# Patient Record
Sex: Male | Born: 1940
Health system: Southern US, Community
[De-identification: ages and names within clinical notes are randomized; demographics above are authoritative.]

## PROBLEM LIST (undated history)

## (undated) ENCOUNTER — Ambulatory Visit

## (undated) DIAGNOSIS — E785 Hyperlipidemia, unspecified: Secondary | ICD-10-CM

## (undated) DIAGNOSIS — G4730 Sleep apnea, unspecified: Secondary | ICD-10-CM

## (undated) DIAGNOSIS — A6920 Lyme disease, unspecified: Secondary | ICD-10-CM

## (undated) DIAGNOSIS — K5790 Diverticulosis of intestine, part unspecified, without perforation or abscess without bleeding: Secondary | ICD-10-CM

## (undated) DIAGNOSIS — IMO0002 Reserved for concepts with insufficient information to code with codable children: Secondary | ICD-10-CM

## (undated) DIAGNOSIS — L570 Actinic keratosis: Secondary | ICD-10-CM

## (undated) DIAGNOSIS — R7301 Impaired fasting glucose: Secondary | ICD-10-CM

## (undated) DIAGNOSIS — N529 Male erectile dysfunction, unspecified: Secondary | ICD-10-CM

## (undated) DIAGNOSIS — G43809 Other migraine, not intractable, without status migrainosus: Secondary | ICD-10-CM

## (undated) DIAGNOSIS — M1990 Unspecified osteoarthritis, unspecified site: Secondary | ICD-10-CM

## (undated) DIAGNOSIS — C61 Malignant neoplasm of prostate: Secondary | ICD-10-CM

## (undated) DIAGNOSIS — M722 Plantar fascial fibromatosis: Secondary | ICD-10-CM

## (undated) DIAGNOSIS — D229 Melanocytic nevi, unspecified: Secondary | ICD-10-CM

## (undated) HISTORY — DX: Actinic keratosis: L57.0

## (undated) HISTORY — PX: TONSILLECTOMY: SHX5217

## (undated) HISTORY — DX: Male erectile dysfunction, unspecified: N52.9

## (undated) HISTORY — DX: Reserved for concepts with insufficient information to code with codable children: IMO0002

## (undated) HISTORY — PX: TONSILLECTOMY: SUR1361

## (undated) HISTORY — DX: Plantar fascial fibromatosis: M72.2

## (undated) HISTORY — DX: Hyperlipidemia, unspecified: E78.5

## (undated) HISTORY — PX: APPENDECTOMY: SHX54

## (undated) HISTORY — PX: COLONOSCOPY: SHX174

## (undated) HISTORY — DX: Sleep apnea, unspecified: G47.30

## (undated) HISTORY — DX: Impaired fasting glucose: R73.01

## (undated) HISTORY — DX: Diverticulosis of intestine, part unspecified, without perforation or abscess without bleeding: K57.90

## (undated) HISTORY — DX: Malignant neoplasm of prostate: C61

## (undated) HISTORY — DX: Melanocytic nevi, unspecified: D22.9

---
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
## 1964-10-11 DIAGNOSIS — J189 Pneumonia, unspecified organism: Secondary | ICD-10-CM

## 1964-10-11 HISTORY — DX: Pneumonia, unspecified organism: J18.9

## 1984-10-11 HISTORY — PX: PROSTATECTOMY: SHX69

## 1998-10-11 ENCOUNTER — Emergency Department (HOSPITAL_COMMUNITY): Admission: EM | Admit: 1998-10-11 | Discharge: 1998-10-11 | Payer: Self-pay | Admitting: Emergency Medicine

## 2002-08-25 ENCOUNTER — Ambulatory Visit (HOSPITAL_BASED_OUTPATIENT_CLINIC_OR_DEPARTMENT_OTHER): Admission: RE | Admit: 2002-08-25 | Discharge: 2002-08-25 | Payer: Self-pay | Admitting: Family Medicine

## 2004-10-11 DIAGNOSIS — D126 Benign neoplasm of colon, unspecified: Secondary | ICD-10-CM

## 2004-10-11 HISTORY — DX: Benign neoplasm of colon, unspecified: D12.6

## 2005-09-09 ENCOUNTER — Ambulatory Visit: Payer: Self-pay | Admitting: Gastroenterology

## 2005-09-14 ENCOUNTER — Ambulatory Visit: Payer: Self-pay | Admitting: Gastroenterology

## 2005-09-14 ENCOUNTER — Encounter (INDEPENDENT_AMBULATORY_CARE_PROVIDER_SITE_OTHER): Payer: Self-pay | Admitting: Specialist

## 2009-08-07 ENCOUNTER — Encounter: Payer: Self-pay | Admitting: Gastroenterology

## 2009-08-18 ENCOUNTER — Encounter (INDEPENDENT_AMBULATORY_CARE_PROVIDER_SITE_OTHER): Payer: Self-pay | Admitting: *Deleted

## 2010-03-26 ENCOUNTER — Telehealth: Payer: Self-pay | Admitting: Gastroenterology

## 2010-07-27 ENCOUNTER — Encounter (INDEPENDENT_AMBULATORY_CARE_PROVIDER_SITE_OTHER): Payer: Self-pay | Admitting: *Deleted

## 2010-09-10 ENCOUNTER — Encounter (INDEPENDENT_AMBULATORY_CARE_PROVIDER_SITE_OTHER): Payer: Self-pay | Admitting: *Deleted

## 2010-09-11 ENCOUNTER — Ambulatory Visit: Payer: Self-pay | Admitting: Gastroenterology

## 2010-09-16 ENCOUNTER — Telehealth: Payer: Self-pay | Admitting: Gastroenterology

## 2010-09-21 ENCOUNTER — Ambulatory Visit: Payer: Self-pay | Admitting: Gastroenterology

## 2010-10-26 ENCOUNTER — Encounter: Payer: Self-pay | Admitting: Cardiology

## 2010-10-26 ENCOUNTER — Encounter
Admission: RE | Admit: 2010-10-26 | Discharge: 2010-10-26 | Payer: Self-pay | Source: Home / Self Care | Attending: Family Medicine | Admitting: Family Medicine

## 2010-11-12 NOTE — Progress Notes (Signed)
Summary: Moviprep prep high copay  Phone Note Call from Patient Call back at Home Phone [phone]   Call For: Dr Russella Dar Reason for Call: Talk to Nurse Summary of Call: Wonders if its normal for Moviprep to cost $60 &  for insurance to normally not cover it? Initial call taken by: Leanor Kail [facility],  September 17, 2010 8:07 AM  Follow-up for Phone Call        Told pt that most insurance companies do not pay for Movi prep and it usually costs around 50 to 60 dollars depending on the pharmacy. Pt states he was just wondering and states he doesn't mind paying for that prep as long as Dr. Russella Dar wants him too. I told pt that Dr. Russella Dar does prefer this prep but I can send him a mail in rebate for the Movi prep if he wants me to. The patient said yes and rebate form mailed.  Follow-up by: Christie Nottingham CMA Duncan Dull),  September 17, 2010 11:26 AM

## 2010-11-12 NOTE — Letter (Signed)
Summary: Pre Visit Letter Revised  [facility]  [address] [place], [place] [address]   Phone: [phone]  Fax: [phone]        07/27/2010 MRN: [id number] [facility] [address] [place], [place]  [address]             Procedure Date:  09/21/2010   Welcome to the Gastroenterology Division at [facility].    You are scheduled to see a nurse for your pre-procedure visit on 09/10/2010 at 8:00AM on the 3rd floor at [facility], [address]  We ask that you try to arrive at our office 15 minutes prior to your appointment time to allow for check-in.  Please take a minute to review the attached form.  If you answer "Yes" to one or more of the questions on the first page, we ask that you call the person listed at your earliest opportunity.  If you answer "No" to all of the questions, please complete the rest of the form and bring it to your appointment.    Your nurse visit will consist of discussing your medical and surgical history, your immediate family medical history, and your medications.   If you are unable to list all of your medications on the form, please bring the medication bottles to your appointment and we will list them.  We will need to be aware of both prescribed and over the counter drugs.  We will need to know exact dosage information as well.    Please be prepared to read and sign documents such as consent forms, a financial agreement, and acknowledgement forms.  If necessary, and with your consent, a friend or relative is welcome to sit-in on the nurse visit with you.  Please bring your insurance card so that we may make a copy of it.  If your insurance requires a referral to see a specialist, please bring your referral form from your primary care physician.  No co-pay is required for this nurse visit.     If you cannot keep your appointment, please call [phone] to cancel or reschedule prior to your appointment date.  This  allows [place] the opportunity to schedule an appointment for another patient in need of care.    Thank you for choosing [facility] for your medical needs.  We appreciate the opportunity to care for you.  Please visit [place] at our website  to learn more about our practice.  Sincerely, The Gastroenterology Division

## 2010-11-12 NOTE — Procedures (Signed)
Summary: Recall / [facility]  Recall / [facility]   Imported By: Lennie Odor 03/12/2010 17:00:57  _____________________________________________________________________  External Attachment:    Type:   Image     Comment:   External Document

## 2010-11-12 NOTE — Miscellaneous (Signed)
Summary: [facility] previsit  Clinical Lists Changes  Medications: Added new medication of MOVIPREP 100 GM  SOLR (PEG-KCL-NACL-NASULF-NA ASC-C) As per prep instructions. - Signed Rx of MOVIPREP 100 GM  SOLR (PEG-KCL-NACL-NASULF-NA ASC-C) As per prep instructions.;  #1 x 0;  Signed;  Entered by: Karl Bales RN;  Authorized by: Meryl Dare MD [facility];  Method used: [id number] [address] [place], [place]  [address], Ph: [phone] or [phone], Fax: [phone] Observations: Added new observation of NKA: T (09/11/2010 16:10)    Prescriptions: MOVIPREP 100 GM  SOLR (PEG-KCL-NACL-NASULF-NA ASC-C) As per prep instructions.  #1 x 0   Entered by:   Karl Bales RN   Authorized by:   Meryl Dare MD [facility]   Signed by:   Karl Bales RN on 09/11/2010   Method used:   [facility]ronically to        [phone][facility] (retail)       [address]       [place], [place]  [address]       Ph: [phone] or [phone]       Fax: [phone]   RxID:   [id number]

## 2010-11-12 NOTE — Procedures (Signed)
Summary: Colonoscopy  Patient: Andrew Hayes Note: All result statuses are Final unless otherwise noted.  Tests: (1) Colonoscopy (COL)   COL Colonoscopy           DONE     [facility]     [address]     [place], [place]  [address]           COLONOSCOPY PROCEDURE REPORT     PATIENT:  Andrew Hayes, Andrew Hayes  MR#:  [id number]     BIRTHDATE:  Nov 09, 1940, 69 yrs. old  GENDER:  male     ENDOSCOPIST:  Judie Petit T. Russella Dar, MD, [facility]           PROCEDURE DATE:  09/21/2010     PROCEDURE:  Colonoscopy 19[address]     ASA CLASS:  Class II     INDICATIONS:  1) surveillance and high-risk screening  2) history     of adenomatous colon polyps: 09/2005.     MEDICATIONS:   Fentanyl 75 mcg IV, Versed 7 mg IV     DESCRIPTION OF PROCEDURE:   After the risks benefits and     alternatives of the procedure were thoroughly explained, informed     consent was obtained.  Digital rectal exam was performed and     revealed no abnormalities.   The LB [id number]-[id number] endoscope     was introduced through the anus and advanced to the cecum, which     was identified by both the appendix and ileocecal valve, without     limitations.  The quality of the prep was excellent, using     MoviPrep.  The instrument was then slowly withdrawn as the colon     was fully examined.     <<PROCEDUREIMAGES>>     FINDINGS:  Mild diverticulosis was found in the sigmoid colon.  A     normal appearing cecum, ileocecal valve, and appendiceal orifice     were identified. The ascending, hepatic flexure, transverse,     splenic flexure, descending colon, and rectum appeared     unremarkable.   Retroflexed views in the rectum revealed no     abnormalities.  The time to cecum =  3.5  minutes. The scope was     then withdrawn (time =  10.5  min) from the patient and the     procedure completed.           COMPLICATIONS:  None           ENDOSCOPIC IMPRESSION:     1) Mild diverticulosis in the sigmoid colon           RECOMMENDATIONS:  1) High fiber diet with liberal fluid intake.     2) Repeat Colonoscopy in 5 years.           Venita Lick. Russella Dar, MD, [facility]           CC: Catha Gosselin, MD           n.     Rosalie DoctorVenita Lick. Stark at 09/21/2010 09:23 AM           Karle Barr, [id number]  Note: An exclamation mark (!) indicates a result that was not dispersed into the flowsheet. Document Creation Date: 09/21/2010 9:23 AM _______________________________________________________________________  (1) Order result status: Final Collection or observation date-time: 09/21/2010 09:18 Requested date-time:  Receipt date-time:  Reported date-time:  Referring Physician:   Ordering Physician: Claudette Head [phone]) Specimen Source:  Source: Launa Grill Order Number:  [id number] Lab site:   Appended Document: Colonoscopy    Clinical Lists Changes  Observations: Added new observation of COLONNXTDUE: 09/2015 (09/21/2010 11:33)

## 2010-11-12 NOTE — Letter (Signed)
Summary: [facility] Instructions  [facility]  [address] [place], [place] [address]   Phone: [phone]  Fax: [phone]       Andrew Hayes    09/20/1941    MRN: [id number]        Procedure Day /Date:  Monday 09/21/2010     Arrival Time:  8:30 am      Procedure Time:  9:30 am     Location of Procedure:                    _ x_  [facility] (4th Floor)                        PREPARATION FOR COLONOSCOPY WITH MOVIPREP   Starting 5 days prior to your procedure Wednesday 12/7 do not eat nuts, seeds, popcorn, corn, beans, peas,  salads, or any raw vegetables.  Do not take any fiber supplements (e.g. Metamucil, Citrucel, and Benefiber).  THE DAY BEFORE YOUR PROCEDURE         DATE: Sunday 12/11  1.  Drink clear liquids the entire day-NO SOLID FOOD  2.  Do not drink anything colored red or purple.  Avoid juices with pulp.  No orange juice.  3.  Drink at least 64 oz. (8 glasses) of fluid/clear liquids during the day to prevent dehydration and help the prep work efficiently.  CLEAR LIQUIDS INCLUDE: Water Jello Ice Popsicles Tea (sugar ok, no milk/cream) Powdered fruit flavored drinks Coffee (sugar ok, no milk/cream) Gatorade Juice: apple, white grape, white cranberry  Lemonade Clear bullion, consomm, broth Carbonated beverages (any kind) Strained chicken noodle soup Hard Candy                             4.  In the morning, mix first dose of MoviPrep solution:    Empty 1 Pouch A and 1 Pouch B into the disposable container    Add lukewarm drinking water to the top line of the container. Mix to dissolve    Refrigerate (mixed solution should be used within 24 hrs)  5.  Begin drinking the prep at 5:00 p.m. The MoviPrep container is divided by 4 marks.   Every 15 minutes drink the solution down to the next mark (approximately 8 oz) until the full liter is complete.   6.  Follow completed prep with 16 oz of clear liquid of your choice  (Nothing red or purple).  Continue to drink clear liquids until bedtime.  7.  Before going to bed, mix second dose of MoviPrep solution:    Empty 1 Pouch A and 1 Pouch B into the disposable container    Add lukewarm drinking water to the top line of the container. Mix to dissolve    Refrigerate  THE DAY OF YOUR PROCEDURE      DATE: Monday 12/12/  Beginning at 4:30 a.m. (5 hours before procedure):         1. Every 15 minutes, drink the solution down to the next mark (approx 8 oz) until the full liter is complete.  2. Follow completed prep with 16 oz. of clear liquid of your choice.    3. You may drink clear liquids until 7:30 am (2 HOURS BEFORE PROCEDURE).   MEDICATION INSTRUCTIONS  Unless otherwise instructed, you should take regular prescription medications with a small sip of water   as early as possible the  morning of your procedure.         OTHER INSTRUCTIONS  You will need a responsible adult at least 68 years of age to accompany you and drive you home.   This person must remain in the waiting room during your procedure.  Wear loose fitting clothing that is easily removed.  Leave jewelry and other valuables at home.  However, you may wish to bring a book to read or  an iPod/MP3 player to listen to music as you wait for your procedure to start.  Remove all body piercing jewelry and leave at home.  Total time from sign-in until discharge is approximately 2-3 hours.  You should go home directly after your procedure and rest.  You can resume normal activities the  day after your procedure.  The day of your procedure you should not:   Drive   Make legal decisions   Operate machinery   Drink alcohol   Return to work  You will receive specific instructions about eating, activities and medications before you leave.    The above instructions have been reviewed and explained to me by  Karl Bales RN  September 11, 2010 4:41 PM     I fully  understand and can verbalize these instructions _____________________________ Date _________

## 2010-11-12 NOTE — Progress Notes (Signed)
Summary: Schedule Colonoscopy  Phone Note Outgoing Call Call back at Home Phone [phone]   Call placed by: Harlow Mares CMA Duncan Dull),  March 26, 2010 10:55 AM Call placed to: Patient Summary of Call: Left message on patients machine to call back, pt is due for colonoscopy Initial call taken by: Harlow Mares CMA Duncan Dull),  March 26, 2010 10:56 AM  Follow-up for Phone Call        pt. returned call. He will check his sch'd and call back for an appt. Follow-up by: Neysa Bonito, [facility]

## 2011-01-05 ENCOUNTER — Other Ambulatory Visit: Payer: Self-pay | Admitting: Dermatology

## 2011-09-06 ENCOUNTER — Other Ambulatory Visit: Payer: Self-pay | Admitting: Orthopaedic Surgery

## 2011-09-06 DIAGNOSIS — M25529 Pain in unspecified elbow: Secondary | ICD-10-CM

## 2011-09-13 ENCOUNTER — Ambulatory Visit
Admission: RE | Admit: 2011-09-13 | Discharge: 2011-09-13 | Disposition: A | Discharge status: Home / Self Care | Payer: MEDICARE | Source: Ambulatory Visit | Attending: Orthopaedic Surgery | Admitting: Orthopaedic Surgery

## 2011-09-13 DIAGNOSIS — M25529 Pain in unspecified elbow: Secondary | ICD-10-CM

## 2011-10-12 HISTORY — PX: KNEE ARTHROSCOPY: SHX127

## 2011-10-12 HISTORY — PX: ELBOW SURGERY: SHX618

## 2011-11-22 ENCOUNTER — Encounter: Payer: Self-pay | Admitting: Cardiology

## 2012-03-08 ENCOUNTER — Ambulatory Visit (INDEPENDENT_AMBULATORY_CARE_PROVIDER_SITE_OTHER): Payer: Medicare Other | Admitting: Cardiology

## 2012-03-08 VITALS — BP 140/80 | HR 62 | Ht 71.0 in | Wt 192.0 lb

## 2012-03-08 DIAGNOSIS — I493 Ventricular premature depolarization: Secondary | ICD-10-CM

## 2012-03-08 DIAGNOSIS — R42 Dizziness and giddiness: Secondary | ICD-10-CM

## 2012-03-08 DIAGNOSIS — I4949 Other premature depolarization: Secondary | ICD-10-CM

## 2012-03-08 DIAGNOSIS — E785 Hyperlipidemia, unspecified: Secondary | ICD-10-CM

## 2012-03-08 NOTE — Patient Instructions (Signed)
Continue your current medication.  You are clear to exercise without restriction  I will see you as needed.

## 2012-03-08 NOTE — Assessment & Plan Note (Signed)
He is concerned that Lipitor may be causing some memory issues. He was to try a lower dose. He is going to reduce his Lipitor to 10 mg per day and has followup lab work with Dr. Clarene Duke in August.

## 2012-03-08 NOTE — Assessment & Plan Note (Signed)
I think his dizzy spell may have been related to more of a vestibular issue walking on a treadmill. His cardiac evaluation has been unremarkable. He was noted to have occasional PVCs but these are benign. I told him I see no reason to restrict his activity. I do not feel that further cardiac evaluation is warranted. He is to call if he has any further problems.

## 2012-03-08 NOTE — Progress Notes (Signed)
Andrew Hayes Date of Birth: 1941-01-10 Medical Record #[id number]  History of Present Illness: Andrew Hayes is seen today for cardiac evaluation the request of Dr. Clarene Duke. He is a pleasant 71 year old white male who is requesting a second cardiac opinion. In late January of this year he was walking on a treadmill when he states he suddenly went blank and he had to catch hold of the treadmill and backup. He did not lose consciousness. He had no associated chest pain or shortness of breath. It was unusual for the fact that he had not been exercising much prior to this and just returned from a trip across the country. His time was limited and so he was rushing his exercise program. Since then he has returned to the gym and has had no further problems. He did undergo extensive evaluation including chest x-ray, ECG, exercise stress test and echocardiogram. On his exercise stress test he had an accentuated blood pressure response and occasional PVCs were noted. It was otherwise negative. His echocardiogram was normal. His only cardiac risk factor is hypercholesterolemia.  Current Outpatient Prescriptions on File Prior to Visit  Medication Sig Dispense Refill  . Atorvastatin Calcium (LIPITOR PO) Take 20 mg by mouth daily.       . Calcium Carbonate-Vit D-Min (GNP CALCIUM PLUS 600 +D PO) Take by mouth as directed.        No Known Allergies  Past Medical History  Diagnosis Date  . Organic impotence   . Prostate cancer   . Lipidemia     Mixed  . Mild sleep apnea   . Diverticulosis     Mild  . Elevated fasting glucose     Mildly  . Plantar fasciitis   . Junctional nevus   . Actinic keratoses   . Herniated disc     Past Surgical History  Procedure Date  . Colonoscopy   . Tonsillectomy   . Appendectomy   . Prostatectomy   . Knee arthroscopy     History  Smoking status  . Former Smoker  Smokeless tobacco  . Not on file    History  Alcohol Use  . Yes    Family History    Problem Relation Age of Onset  . Stroke Mother   . Hypertension Mother   . COPD Father   . Dementia Brother   . Hydrocephalus Brother     Review of Systems: As noted in history of present illness.  All other systems were reviewed and are negative.  Physical Exam: BP 140/80  Pulse 62  Ht 5\' 11"  (1.803 m)  Wt 192 lb (87.091 kg)  BMI 26.78 kg/m2 The patient is alert and oriented x 3.  The mood and affect are normal.  The skin is warm and dry.  Color is normal.  The HEENT exam reveals that the sclera are nonicteric.  The mucous membranes are moist.  The carotids are 2+ without bruits.  There is no thyromegaly.  There is no JVD.  The lungs are clear.  The chest wall is non tender.  The heart exam reveals a regular rate with a normal S1 and S2.  There are no murmurs, gallops, or rubs.  The PMI is not displaced.   Abdominal exam reveals good bowel sounds.  There is no guarding or rebound.  There is no hepatosplenomegaly or tenderness.  There are no masses.  Exam of the legs reveal no clubbing, cyanosis, or edema.  The legs are without rashes.  The  distal pulses are intact.  Cranial nerves II - XII are intact.  Motor and sensory functions are intact.  The gait is normal.  LABORATORY DATA: The results of his exercise stress test and echocardiogram were reviewed in detail. Most recent lipid panel showed a total cholesterol of 145, triglycerides 117, LDL 87, HDL 37.  Assessment / Plan:

## 2012-08-09 ENCOUNTER — Other Ambulatory Visit: Payer: Self-pay | Admitting: Dermatology

## 2012-09-05 ENCOUNTER — Encounter: Payer: Self-pay | Admitting: Cardiology

## 2013-06-21 ENCOUNTER — Ambulatory Visit: Payer: Medicare Other | Admitting: Physician Assistant

## 2013-06-28 ENCOUNTER — Ambulatory Visit (INDEPENDENT_AMBULATORY_CARE_PROVIDER_SITE_OTHER): Payer: Medicare Other | Admitting: Physician Assistant

## 2013-06-28 ENCOUNTER — Ambulatory Visit: Payer: Medicare Other | Admitting: Neurology

## 2013-06-28 ENCOUNTER — Encounter: Payer: Self-pay | Admitting: Physician Assistant

## 2013-06-28 VITALS — BP 138/82 | HR 64 | Ht 71.0 in | Wt 191.0 lb

## 2013-06-28 DIAGNOSIS — R0683 Snoring: Secondary | ICD-10-CM

## 2013-06-28 DIAGNOSIS — R0609 Other forms of dyspnea: Secondary | ICD-10-CM

## 2013-06-28 DIAGNOSIS — R42 Dizziness and giddiness: Secondary | ICD-10-CM

## 2013-06-28 DIAGNOSIS — E785 Hyperlipidemia, unspecified: Secondary | ICD-10-CM

## 2013-06-28 NOTE — Progress Notes (Signed)
[address] [place], [place]  [address] Phone: [phone] Fax:  [phone]  Date:  06/28/2013   ID:  Andrew Hayes, [place] June 21, 1941, MRN [id number]  PCP:  Mickie Hillier, MD  Cardiologist:  Dr. Peter Swaziland     History of Present Illness: Andrew Hayes is a 72 y.o. male who returns for the evaluation of dizziness.   Has a history of prostate CA, HL, sleep apnea, glucose intolerance. He was evaluated by Dr. Swaziland 02/2012 for dizziness. He had a prior exercise stress test which was notable for accentuated blood pressure response and occasional PVCs, otherwise negative. Echocardiogram 2/13: EF 60-65%, trace MR, trivial TR, trace PI.  Abdominal US 6/13: Normal abdominal aorta. Dizziness was felt to be more of a vestibular issue no further cardiac workup was recommended.  Two weeks ago, he noted increasing lightheadedness c/w his prior vertigo.  He noted increased HRs while playing golf with this.  He had worse vertigo the next 2 days.  He had a hard time sitting up due to the spinning sensation.  He denies chest pain, shortness of breath, syncope, orthopnea, PND or edema. He is NYHA class 1-2.  Wt Readings from Last 3 Encounters:  06/28/13 191 lb (86.637 kg)  03/08/12 192 lb (87.091 kg)     Past Medical History  Diagnosis Date  . Organic impotence   . Prostate cancer   . Lipidemia     Mixed  . Mild sleep apnea   . Diverticulosis     Mild  . Elevated fasting glucose     Mildly  . Plantar fasciitis   . Junctional nevus   . Actinic keratoses   . Herniated disc     Current Outpatient Prescriptions  Medication Sig Dispense Refill  . aspirin 81 MG tablet Take 81 mg by mouth daily.      Marland Kitchen atorvastatin (LIPITOR) 10 MG tablet Take 1 tablet by mouth daily.      . Calcium Carbonate-Vit D-Min (GNP CALCIUM PLUS 600 +D PO) Take by mouth as directed.      . Multiple Vitamin (MULTI-VITAMIN PO) Take by mouth daily.      . Omega-3 Fatty Acids (FISH OIL PO)  Take by mouth daily.       No current facility-administered medications for this visit.    Allergies:   No Known Allergies  Social History:  The patient  reports that he has quit smoking. He does not have any smokeless tobacco history on file. He reports that  drinks alcohol. He reports that he does not use illicit drugs.   ROS:  Please see the history of present illness.   He does snore.   All other systems reviewed and negative.   PHYSICAL EXAM: VS:  BP 138/82  Pulse 64  Ht 5\' 11"  (1.803 m)  Wt 191 lb (86.637 kg)  BMI 26.65 kg/m2 Well nourished, well developed, in no acute distress HEENT: normal Neck: no JVD Cardiac:  normal S1, S2; RRR; no murmur Lungs:  clear to auscultation bilaterally, no wheezing, rhonchi or rales Abd: soft, nontender, no hepatomegaly Ext: no edema Skin: warm and dry Neuro:  CNs 2-12 intact, no focal abnormalities noted  EKG:  NSR, HR 84, normal axis, no acute changes     ASSESSMENT AND PLAN:  1. Dizziness: His symptoms are fairly consistent with benign positional vertigo. He has an appointment with a neurologist arranged. I encouraged him to keep this appointment.  He may  benefit from vestibular rehabilitation. He can discuss this further with his primary care physician. I offered him the option of proceeding with a 30 day event monitor versus watchful waiting. He prefers watchful waiting for now. 2. Snoring: He is interested in sleep testing. He will discuss this with his primary care physician. 3. Hyperlipidemia: Continue statin. 4. Disposition: Followup with Dr. Swaziland prn.  Signed, Tereso Newcomer, PA-C  06/28/2013 10:18 AM

## 2013-07-03 ENCOUNTER — Encounter: Payer: Self-pay | Admitting: Neurology

## 2013-07-03 ENCOUNTER — Ambulatory Visit (INDEPENDENT_AMBULATORY_CARE_PROVIDER_SITE_OTHER): Payer: Medicare Other | Admitting: Neurology

## 2013-07-03 VITALS — BP 160/97 | HR 68 | Ht 71.0 in | Wt 189.0 lb

## 2013-07-03 DIAGNOSIS — R4189 Other symptoms and signs involving cognitive functions and awareness: Secondary | ICD-10-CM

## 2013-07-03 DIAGNOSIS — F09 Unspecified mental disorder due to known physiological condition: Secondary | ICD-10-CM

## 2013-07-03 MED ORDER — MELATONIN 5 MG PO TABS: 5.0000 mg | ORAL_TABLET | Freq: Every day | ORAL | Status: DC

## 2013-07-03 NOTE — Patient Instructions (Addendum)
Overall you are doing fairly well but I do want to suggest a few things today:   Remember to drink plenty of fluid, eat healthy meals and do not skip any meals. Try to eat protein with a every meal and eat a healthy snack such as fruit or nuts in between meals. Try to keep a regular sleep-wake schedule and try to exercise daily, particularly in the form of walking, 20-30 minutes a day, if you can.   As far as your medications are concerned, I would like to suggest starting Melatonin 5mg  nightly  As far as diagnostic testing: we will check some blood work to check for treatable causes of your memory decline.   I would like to see you back in 2 to 3 months, sooner if we need to. Please call us with any interim questions, concerns, problems, updates or refill requests.   Please also call us for any test results so we can go over those with you on the phone.  My clinical assistant and will answer any of your questions and relay your messages to me and also relay most of my messages to you.   Our phone number is [phone]. We also have an after hours call service for urgent matters and there is a physician on-call for urgent questions. For any emergencies you know to call 911 or go to the nearest emergency room

## 2013-07-03 NOTE — Progress Notes (Signed)
[facility]  Provider:  Dr Hosie Hayes Referring Provider: Catha Gosselin, MD Primary Care Physician:  Andrew Hillier, MD  CC:  Ataxia  HPI:  Andrew Hayes is a 72 y.o. male here as a referral from Andrew Hayes for ataxia Off and on for 46yrs, happens every 2 to 3 years, can last a few days, typically comes on quickly and then resolves quickly. Gets light headed, feels unsteady, feels like he is going to fall over. Gets nausea. If he sits up symptoms worsen, improve with sitting down but doesn't resolve. No change in his vision. Feels he will fall more towards the right. No change in hearing during these episdoes. No headaches. Notes slight palpitations during the events. Has had a cardiology workup in the past which was normal, had echo and stress test. Not triggered by turning head in a certain position. Tried doing the Epley maneuver on his own. No tinnitus, no ear fullness.   No family history of ataxia.   Brother had dementia, passed away at 108. Mother, maternal aunt and patients sister told they have PD. Notices some difficulty with his sleep pattern, wakes up 2 to 3 hours, no need to urinate. Snores in his sleep, no apnea events noted. Has some RBD behavior. Intact sense of smell. Remains very active, no muscle stiffness, no slowing down of movements.   Notes some difficulty with short term memory,  Review of Systems: Out of a complete 14 system review, the patient complains of only the following symptoms, and all other reviewed systems are negative. + for memory decline  History   Social History  . Marital Status: Married    Spouse Name: Andrew Hayes    Number of Children: 1  . Years of Education: 16   Occupational History  . cpa     retired   Social History Main Topics  . Smoking status: Former Smoker    Quit date: 10/11/1984  . Smokeless tobacco: Former Neurosurgeon  . Alcohol Use: Yes     Comment: 8-10 beers weekly  . Drug Use: No  . Sexual Activity: Not on  file   Other Topics Concern  . Not on file   Social History Narrative   Patient consumes 2-3 cups of caffeine daily,right handed.    Family History  Problem Relation Age of Onset  . Stroke Mother   . Hypertension Mother   . Parkinson's disease Mother   . COPD Father   . Dementia Brother   . Hydrocephalus Brother   . Parkinson's disease Brother   . Parkinson's disease Sister     Past Medical History  Diagnosis Date  . Organic impotence   . Lipidemia     Mixed  . Mild sleep apnea   . Diverticulosis     Mild  . Elevated fasting glucose     Mildly  . Plantar fasciitis   . Junctional nevus   . Actinic keratoses   . Herniated disc   . Prostate cancer     Past Surgical History  Procedure Laterality Date  . Colonoscopy    . Tonsillectomy    . Appendectomy    . Prostatectomy  1986  . Knee arthroscopy  2013    knee and elbow    Current Outpatient Prescriptions  Medication Sig Dispense Refill  . aspirin 81 MG tablet Take 81 mg by mouth daily.      Marland Kitchen atorvastatin (LIPITOR) 10 MG tablet Take 1 tablet by mouth daily.      Marland Kitchen  Calcium Carbonate-Vit D-Min (GNP CALCIUM PLUS 600 +D PO) Take by mouth as directed.      . Multiple Vitamin (MULTI-VITAMIN PO) Take by mouth daily.      . Omega-3 Fatty Acids (FISH OIL PO) Take by mouth daily.       No current facility-administered medications for this visit.    Allergies as of 07/03/2013  . (No Known Allergies)    Vitals: BP 160/97  Pulse 68  Ht 5\' 11"  (1.803 m)  Wt 189 lb (85.73 kg)  BMI 26.37 kg/m2 Last Weight:  Wt Readings from Last 1 Encounters:  07/03/13 189 lb (85.73 kg)   Last Height:   Ht Readings from Last 1 Encounters:  07/03/13 5\' 11"  (1.803 m)     Physical exam: Exam: Gen: NAD, conversant Eyes: anicteric sclerae, moist conjunctivae HENT: Atraumati Neck: Trachea midline; supple,  Lungs: CTA, no wheezing, rales, rhonic                          CV: RRR, no MRG Abdomen: Soft, non-tender;   Extremities: No peripheral edema  Skin: Normal temperature, no rash,  Psych: Appropriate affect, pleasant  Neuro: MS: AA&Ox3, appropriately interactive, normal affect   Attention: WORLD backwards  Speech: fluent w/o paraphasic error  Memory: good recent and remote recall  CN: PERRL, EOMI no nystagmus, no ptosis, sensation intact to LT V1-V3 bilat, face symmetric, no weakness, hearing grossly intact, palate elevates symmetrically, shoulder shrug 5/5 bilat,  tongue protrudes midline, no fasiculations noted.  Motor: normal bulk and tone Strength: 5/5  In all extremities  Coord: rapid alternating and point-to-point (FNF, HTS) movements intact.  Reflexes: symmetrical, bilat downgoing toes  Sens: LT intact in all extremities  Gait: posture, stance, stride and arm-swing normal. Tandem gait intact. Able to walk on heels and toes. Romberg absent.   Assessment:  After physical and neurologic examination, review of laboratory studies, imaging, neurophysiology testing and pre-existing records, assessment will be reviewed on the problem list.  Plan:  Treatment plan and additional workup will be reviewed under Problem List.  Mr Andrew Hayes is a pleasant 72y/o gentleman who presents for evaluation of episodic periods of vertigo and ataxia. He also expresses concern over memory decline, insomnia and question of parkinson's as he has a strong family history. Unclear etiology of his periods of vertigo. Does not appear to be a vascular component. Would consider a paroxysmal positional vertigo. With episodic presentation would also consider a genetic cause such as one of the Episodic Ataxias, though unlikely as there is no family history. The patient does have some mild bradykinesia and rest tremor with distraction which could potentially be early onset parkinsonism but would just monitor at this point.   1)Vertigo-resolved 2)insomnia 3)cognitive decline 4)bradykinesia  -check lab work for causes  of cognitive decline -if symptoms return would consider ENT evaluation, referral for vestibular rehab -start melatonin 5mg  qhs, can consider sleep study in the Andrew -follow up in 3 to 4 months

## 2013-07-07 LAB — VITAMIN B1, WHOLE BLOOD: Thiamine: 173.8 nmol/L (ref 66.5–200.0)

## 2013-07-07 LAB — TSH: TSH: 1.93 u[IU]/mL (ref 0.450–4.500)

## 2013-07-09 NOTE — Progress Notes (Signed)
Quick Note:  Left message for patient that blood work results were normal, per Dr. Hosie Poisson. Told to call with any questions. ______

## 2013-08-30 ENCOUNTER — Other Ambulatory Visit: Payer: Self-pay | Admitting: Dermatology

## 2013-10-22 ENCOUNTER — Ambulatory Visit: Payer: Medicare Other | Admitting: Neurology

## 2014-02-11 ENCOUNTER — Institutional Professional Consult (permissible substitution): Payer: Medicare Other | Admitting: Neurology

## 2014-02-14 ENCOUNTER — Ambulatory Visit (INDEPENDENT_AMBULATORY_CARE_PROVIDER_SITE_OTHER): Payer: Medicare Other | Admitting: Neurology

## 2014-02-14 ENCOUNTER — Encounter: Payer: Self-pay | Admitting: Neurology

## 2014-02-14 ENCOUNTER — Encounter (INDEPENDENT_AMBULATORY_CARE_PROVIDER_SITE_OTHER): Payer: Self-pay

## 2014-02-14 VITALS — BP 154/95 | HR 63 | Temp 97.8°F | Ht 71.0 in | Wt 191.0 lb

## 2014-02-14 DIAGNOSIS — G4710 Hypersomnia, unspecified: Secondary | ICD-10-CM

## 2014-02-14 DIAGNOSIS — R400 Somnolence: Secondary | ICD-10-CM

## 2014-02-14 DIAGNOSIS — G478 Other sleep disorders: Secondary | ICD-10-CM

## 2014-02-14 DIAGNOSIS — R0989 Other specified symptoms and signs involving the circulatory and respiratory systems: Secondary | ICD-10-CM

## 2014-02-14 DIAGNOSIS — R0683 Snoring: Secondary | ICD-10-CM

## 2014-02-14 DIAGNOSIS — H9190 Unspecified hearing loss, unspecified ear: Secondary | ICD-10-CM

## 2014-02-14 DIAGNOSIS — R0609 Other forms of dyspnea: Secondary | ICD-10-CM
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
## 2014-02-14 NOTE — Patient Instructions (Signed)
Please remember to try to maintain good sleep hygiene, which means: Keep a regular sleep and wake schedule, try not to exercise or have a meal within 2 hours of your bedtime, try to keep your bedroom conducive for sleep, that is, cool and dark, without light distractors such as an illuminated alarm clock, and refrain from watching TV right before sleep or in the middle of the night and do not keep the TV or radio on during the night. Also, try not to use or play on electronic devices at bedtime, such as your cell phone, tablet PC or laptop. If you like to read at bedtime on an electronic device, try to dim the background light as much as possible. Do not eat in the middle of the night.   Based on your symptoms and your exam I believe you are at some risk for obstructive sleep apnea or OSA, and I think we should proceed with a sleep study to determine whether you do or do not have OSA and how severe it is. If you have more than mild OSA, I want you to consider treatment with CPAP. Please remember, the risks and ramifications of moderate to severe obstructive sleep apnea or OSA are: Cardiovascular disease, including congestive heart failure, stroke, difficult to control hypertension, arrhythmias, and even type 2 diabetes has been linked to untreated OSA. Sleep apnea causes disruption of sleep and sleep deprivation in most cases, which, in turn, can cause recurrent headaches, problems with memory, mood, concentration, focus, and vigilance. Most people with untreated sleep apnea report excessive daytime sleepiness, which can affect their ability to drive. Please do not drive if you feel sleepy.  I will see you back after your sleep study to go over the test results and where to go from there. We will call you after your sleep study and to set up an appointment at the time.

## 2014-02-22 ENCOUNTER — Other Ambulatory Visit: Payer: Self-pay | Admitting: Family Medicine

## 2014-02-22 ENCOUNTER — Ambulatory Visit
Admission: RE | Admit: 2014-02-22 | Discharge: 2014-02-22 | Disposition: A | Discharge status: Home / Self Care | Payer: Medicare Other | Source: Ambulatory Visit | Attending: Family Medicine | Admitting: Family Medicine

## 2014-02-22 DIAGNOSIS — M549 Dorsalgia, unspecified: Secondary | ICD-10-CM

## 2014-02-22 DIAGNOSIS — R109 Unspecified abdominal pain: Secondary | ICD-10-CM

## 2014-02-22 DIAGNOSIS — R1031 Right lower quadrant pain: Secondary | ICD-10-CM

## 2014-02-22 MED ORDER — IOHEXOL 300 MG/ML  SOLN
100.0000 mL | Freq: Once | INTRAMUSCULAR | Status: AC | PRN
  Administered 2014-02-22: 100 mL via INTRAVENOUS

## 2014-04-18 ENCOUNTER — Ambulatory Visit (INDEPENDENT_AMBULATORY_CARE_PROVIDER_SITE_OTHER): Payer: Medicare Other | Admitting: Neurology

## 2014-04-18 DIAGNOSIS — G4733 Obstructive sleep apnea (adult) (pediatric): Secondary | ICD-10-CM

## 2014-04-18 DIAGNOSIS — R400 Somnolence: Secondary | ICD-10-CM

## 2014-04-18 DIAGNOSIS — G478 Other sleep disorders: Secondary | ICD-10-CM

## 2014-04-18 DIAGNOSIS — R0683 Snoring: Secondary | ICD-10-CM

## 2014-04-18 DIAGNOSIS — G4710 Hypersomnia, unspecified: Secondary | ICD-10-CM

## 2014-05-03 ENCOUNTER — Telehealth: Payer: Self-pay | Admitting: Neurology

## 2014-05-03 DIAGNOSIS — G4733 Obstructive sleep apnea (adult) (pediatric): Secondary | ICD-10-CM

## 2014-05-03 NOTE — Telephone Encounter (Signed)
Please call and notify the patient that the recent sleep study did confirm the diagnosis of obstructive sleep apnea and that I recommend treatment for this in the form of CPAP. This will require a repeat sleep study for proper titration and mask fitting. Please explain to patient and arrange for a CPAP titration study. I have placed an order in the chart. Thanks, Babara Buffalo, MD, PhD [facility] ([facility]  

## 2014-05-04 ENCOUNTER — Encounter: Payer: Self-pay | Admitting: Neurology

## 2014-05-04 NOTE — Telephone Encounter (Signed)
Called the patient and left a message explaining dx of osa and need for repeat sleep study.  He has been instructed to call the office to schedule this appointment.  A copy of this report was faxed to Dr. [name] and will be mailed to the pt's home address.

## 2014-06-18 ENCOUNTER — Ambulatory Visit (INDEPENDENT_AMBULATORY_CARE_PROVIDER_SITE_OTHER): Payer: Medicare Other | Admitting: Neurology

## 2014-06-18 VITALS — BP 149/82

## 2014-06-18 DIAGNOSIS — G4733 Obstructive sleep apnea (adult) (pediatric): Secondary | ICD-10-CM

## 2014-06-18 DIAGNOSIS — G4761 Periodic limb movement disorder: Secondary | ICD-10-CM

## 2014-06-28 ENCOUNTER — Telehealth: Payer: Self-pay | Admitting: Neurology

## 2014-06-28 DIAGNOSIS — G4733 Obstructive sleep apnea (adult) (pediatric): Secondary | ICD-10-CM

## 2014-06-28 NOTE — Telephone Encounter (Signed)
Please call and inform patient that I have entered an order for treatment with PAP. He did well during the latest sleep study with CPAP. We will, therefore, arrange for a machine for home use through a DME (durable medical equipment) company of His choice; and I will see the patient back in follow-up in about 6 weeks. Please also explain to the patient that I will be looking out for compliance data downloaded from the machine, which can be done remotely through a modem at times or stored on an SD card in the back of the machine. At the time of the followup appointment we will discuss sleep study results and how it is going with PAP treatment at home. Please advise patient to bring His machine at the time of the visit; at least for the first visit, even though this is cumbersome. Bringing the machine for every visit after that may not be needed, but often helps for the first visit. Please also make sure, the patient has a follow-up appointment with me in about 6 weeks from the setup date, thanks.   Joni Colegrove, MD, PhD [facility] ([facility]  

## 2014-07-01 ENCOUNTER — Encounter: Payer: Self-pay | Admitting: Neurology

## 2014-07-02 NOTE — Telephone Encounter (Signed)
Spoke with patient and provided him the results of his recent CPAP titration report.  The patient was in agreement in starting CPAP therapy and was instructed that his DME company would be [employer].  Patient was informed that someone from [facility] would be in touch with him.  Patient informed that we would provide him a copy as well as the referring doctor, [name].  Patient was instructed on the importance of CPAP compliance and usage.

## 2014-09-10 ENCOUNTER — Other Ambulatory Visit: Payer: Self-pay | Admitting: Dermatology

## 2014-09-13 ENCOUNTER — Encounter: Payer: Self-pay | Admitting: Neurology

## 2014-09-18 ENCOUNTER — Telehealth: Payer: Self-pay

## 2014-09-18 NOTE — Telephone Encounter (Signed)
Dr. Rexene Alberts just wanted to give you a little information on this patient. I contacted him to schedule a follow up with you and he is a little frustrated with the machine he states he goes to bed every night with same routine, but is having a different outcome from the machine he has tried to use a chin strap states he likes the mask but not the strap. I set him up to see you in the morning at 8:30 am which he was very appreciative of and stated he is willing to try anything to make this a success with the use of his CPAP machine.

## 2014-09-19 ENCOUNTER — Ambulatory Visit (INDEPENDENT_AMBULATORY_CARE_PROVIDER_SITE_OTHER): Payer: Medicare Other | Admitting: Neurology

## 2014-09-19 ENCOUNTER — Encounter: Payer: Self-pay | Admitting: Neurology

## 2014-09-19 VITALS — BP 144/86 | HR 68 | Temp 97.7°F | Ht 71.0 in | Wt 198.0 lb

## 2014-09-19 DIAGNOSIS — G4733 Obstructive sleep apnea (adult) (pediatric): Secondary | ICD-10-CM

## 2014-09-19 DIAGNOSIS — Z9989 Dependence on other enabling machines and devices: Principal | ICD-10-CM

## 2014-09-19 NOTE — Progress Notes (Signed)
Subjective:    Patient ID: Andrew Hayes is a 73 y.o. male.  HPI     Interim history:   Andrew Hayes is a 73 year old right-handed gentleman with an underlying medical history of prostate cancer diagnosed in 1996, hyperlipidemia, colonic polyps, diverticulosis, plantar fasciitis, vertigo and dizziness for which he saw Dr. Janann Colonel in our office, who presents for follow-up consultation of his obstructive sleep apnea. The patient is unaccompanied today. I first met him on 02/14/2014 at the request of his primary care physician, at which time the patient reported non-restorative sleep, snoring, daytime somnolence and nighttime awakenings, essentially since 1996 (after his prostate cancer surgery). I suggested he return for sleep study. He had a baseline sleep study on 04/18/2014 followed by a CPAP titration study on 06/18/2014 and went over his test results with him in detail today. His baseline sleep study showed a reduced sleep efficiency at 72.8% with a latency to sleep normal at 11 minutes and wake after sleep onset elevated at 92 minutes with mild sleep fragmentation noted. The arousal index was mildly elevated. He had an increased percentage of stage II sleep, 3.4% of slow-wave sleep, and REM sleep at 19.3% with a normal REM latency of 96.5 minutes. He had no significant PLMS or EKG changes. He had no significant EEG changes. He had mild intermittent snoring. Total AHI was 8.4 per hour, rising to 12.3 per hour during REM sleep and 15.3 per hour in the supine position. Average oxygen saturation was 95%, nadir was 81% in REM sleep. Based on his sleep related complaints I advised him to return for another sleep study for CPAP titration. His sleep efficiency during the second study was 72.8%. Latency to sleep was normal at 8 minutes and wake after sleep onset was elevated at 91.5 minutes with moderate sleep fragmentation noted. He had a mildly elevated arousal index. He had a normal percentage of light  stage sleep, an increased percentage of deep sleep at 29.4% and a mildly reduced percentage of REM sleep at 16.5% with a normal REM latency. He had mild PLMS at 8.5 per hour with an associated arousal index of 4.5 per hour. He had no significant EKG or EEG changes. He was titrated on CPAP from 5-13 cm. AHI was 1.8 per hour at the pressure of 9. Supine REM sleep was achieved. He did indicate sleeping better with CPAP during that night.  Today I reviewed his CPAP compliance data from 08/14/2014 through 09/12/2014 which is a total of 30 days during which time he used his machine 26 days. Percent used days greater than 4 hours was 83%, indicating fairly good compliance. Average usage was 4 hours and 31 minutes. Pressure at 9 cm. Leak was at times high with the 95th percentile at 27.5 L/m. Average AHI was suboptimal at 7.5 per hour. Looking at the breakdown of his AHI it appears that his central apnea index was elevated at 4.2 per hour.  Today, he reports that he is trying to be compliant. He did not do well with the nasal pillows. He is now on a Mirage FX, but has some mouth opening. He has trouble tolerating the chin strap. He is a little frustrated about the issues with the leak. Overall, he does feel that his sleep is actually better. He wants to be fully compliant and is committed to using his machine regularly and properly. We looked at both sleep studies together and the compliance data and I explained the findings. He denies any  restless leg symptoms. He has a family history of Parkinson's disease and dementia and worries about these issues. His balance is actually better.  He had seen Dr. Maxwell Caul for this before and had a home sleep test on 10/25/2013 which indicated no significant sleep disorder breathing with an AHI estimated at 3 per hour. Time below 89% saturation was 2 minutes. He reports a bedtime of 11:30 to 11:45 PM, and goes to sleep in minutes, with occasional OTC sleep aid, but not on a night to  night basis. He wakes up 1-2 times per night, and sometimes his mind races and he feels it helps to turn his radio on for distraction. He goes to the bathroom 0-1 times per night. His wife's sleep used to disturb him, because of her snoring and restlessness, but they have been sleeping in separate bedrooms for years. He does not wake up with headaches. He snores, but did not used to snore. He has been able to lose weight and plays golf 2 times a week, goes to the gym 2 times per week. He likes fly fishing. He does not wake up rested. His rise time is between 5:30 to 6 AM. He rarely dreams. He is sleepy during the day, but does not take a nap on a scheduled basis. If he lies down in the afternoon, he will go to sleep. His ESS is 14/24 today. He has fallen asleep at the wheel x 2, both of which happened after a long day of fishing on a longer drive home. He has not had a MVA and no dozing off at the wheel in the last 3 years.  There is no report of nighttime reflux, with no nighttime cough experienced. The patient has not noted any RLS symptoms and is not known to kick while asleep or before falling asleep. There is no family history of RLS or OSA. Father was a heavy snorer, mother had some sleep disruption.    He is not a very restless sleeper.    He denies cataplexy, sleep paralysis, hypnagogic or hypnopompic hallucinations, or sleep attacks. He does not report any vivid dreams, nightmares, dream enactments, or parasomnias, such as sleep talking or sleep walking. The patient has not had an attended sleep study.   He consumes 2 to 3 caffeinated beverages per day, usually in the form of coffee in the morning and ice tea with meals some time.   His bedroom is usually dark and cool. There is no TV in the bedroom and he uses a radio, with low volume.   He quit smoking in '86 and drinks 1 beer per day.   His Past Medical History Is Significant For: Past Medical History  Diagnosis Date  . Organic impotence   .  Lipidemia     Mixed  . Mild sleep apnea   . Diverticulosis     Mild  . Elevated fasting glucose     Mildly  . Plantar fasciitis   . Junctional nevus   . Actinic keratoses   . Herniated disc   . Prostate cancer     His Past Surgical History Is Significant For: Past Surgical History  Procedure Laterality Date  . Colonoscopy    . Tonsillectomy    . Appendectomy    . Prostatectomy  1986  . Knee arthroscopy  2013    knee and elbow  . Elbow surgery  2013    His Family History Is Significant For: Family History  Problem Relation Age of Onset  .  Stroke Mother   . Hypertension Mother   . Parkinson's disease Mother   . COPD Father   . Dementia Brother   . Hydrocephalus Brother   . Parkinson's disease Brother   . Parkinson's disease Sister     His Social History Is Significant For: History   Social History  . Marital Status: Married    Spouse Name: Hassan Rowan    Number of Children: 2  . Years of Education: 16   Occupational History  . cpa     retired   Social History Main Topics  . Smoking status: Former Smoker -- 2.00 packs/day for 24 years    Types: Cigarettes    Quit date: 10/11/1984  . Smokeless tobacco: Former Systems developer  . Alcohol Use: Yes     Comment: 8-10 beers weekly  . Drug Use: No  . Sexual Activity: None   Other Topics Concern  . None   Social History Narrative   Patient lives at home with family.   Patient consumes 2-3 cups of caffeine daily,right handed.   2 children (1 deceased)    His Allergies Are:  No Known Allergies:   His Current Medications Are:  Outpatient Encounter Prescriptions as of 09/19/2014  Medication Sig  . aspirin 81 MG tablet Take 81 mg by mouth daily.  Marland Kitchen atorvastatin (LIPITOR) 10 MG tablet Take 1 tablet by mouth daily.  . Multiple Vitamin (MULTI-VITAMIN PO) Take by mouth daily.  . Omega-3 Fatty Acids (FISH OIL PO) Take by mouth daily.  . [DISCONTINUED] levofloxacin (LEVAQUIN) 500 MG tablet Take 1 tablet by mouth daily. 10  days  :  Review of Systems:  Out of a complete 14 point review of systems, all are reviewed and negative with the exception of these symptoms as listed below:   Review of Systems  Skin: Positive for rash.    Objective:  Neurologic Exam  Physical Exam Physical Examination:   Filed Vitals:   09/19/14 0831  BP: 144/86  Pulse: 68  Temp: 97.7 F (36.5 C)    General Examination: The patient is a very pleasant 73 y.o. male in no acute distress. He appears well-developed and well-nourished and well groomed. He is in good spirits today.  HEENT: Normocephalic, atraumatic, pupils are equal, round and reactive to light and accommodation. Funduscopic exam is normal with sharp disc margins noted. Extraocular tracking is good without limitation to gaze excursion or nystagmus noted. Normal smooth pursuit is noted. Hearing is impaired. Face is symmetric with normal facial animation and normal facial sensation. Speech is clear with no dysarthria noted. There is no hypophonia. There is no lip, neck/head, jaw or voice tremor. Neck is supple with full range of passive and active motion. There are no carotid bruits on auscultation. Oropharynx exam reveals: mild mouth dryness, adequate dental hygiene and moderate airway crowding, due to longer tongue and redundant soft palate and longer uvula. Mallampati is class II. Tongue protrudes centrally and palate elevates symmetrically. Tonsils are absent.    Chest: Clear to auscultation without wheezing, rhonchi or crackles noted.  Heart: S1+S2+0, regular and normal without murmurs, rubs or gallops noted.   Abdomen: Soft, non-tender and non-distended with normal bowel sounds appreciated on auscultation.  Extremities: There is no pitting edema in the distal lower extremities bilaterally. Pedal pulses are intact.  Skin: Warm and dry without trophic changes noted. There are no varicose veins.  Musculoskeletal: exam reveals no obvious joint deformities,  tenderness or joint swelling or erythema.   Neurologically:  Mental  status: The patient is awake, alert and oriented in all 4 spheres. His immediate and remote memory, attention, language skills and fund of knowledge are appropriate. There is no evidence of aphasia, agnosia, apraxia or anomia. Speech is clear with normal prosody and enunciation. Thought process is linear. Mood is normal and affect is normal.  Cranial nerves II - XII are as described above under HEENT exam. In addition: shoulder shrug is normal with equal shoulder height noted. Motor exam: Normal bulk, strength and tone is noted. There is no drift, tremor or rebound. Romberg is negative. Reflexes are 2+ throughout. Babinski: Toes are flexor bilaterally. Fine motor skills and coordination: intact with normal finger taps, normal hand movements, normal rapid alternating patting, normal foot taps and normal foot agility.  Cerebellar testing: No dysmetria or intention tremor on finger to nose testing. Heel to shin is unremarkable bilaterally. There is no truncal or gait ataxia.  Sensory exam: intact to light touch, pinprick, vibration, temperature sense in the upper and lower extremities.  Gait, station and balance: He stands easily. No veering to one side is noted. No leaning to one side is noted. Posture is age-appropriate and stance is narrow based. Gait shows normal stride length and normal pace. No problems turning are noted. He turns en bloc. Tandem walk is unremarkable.  Assessment and Plan:   In summary, Andrew Hayes is a very pleasant 73 year old male with an underlying medical history of prostate cancer diagnosed in 1996, hyperlipidemia, colonic polyps, diverticulosis, plantar fasciitis, vertigo and dizziness, who presents for follow-up consultation of his obstructive sleep apnea. He is now established on CPAP therapy. He has had good compliance. He reports improvement of his sleep. I congratulated him on his compliance. Today  we talked about his sleep study results and his compliance data in detail. I talked to him about his sleep study results from both baseline and titration test. I think our best option is to scale back a little bit on his pressure. This may improve his AHI as in reducing his central apneas. It may help his mouth dryness and the leak as well. Before resorting to a full face mask which is at times very hard to tolerate and makes for a higher risk for leakage, we should try to reduce his pressure. He is in agreement. I reassured him that he has no parkinsonism on exam. He feels improved with his sleep and is hopeful that he can get to a point where he can tolerate the mask and pressure fully.  I again had a long chat with the patient about my findings and the diagnosis of OSA, its prognosis and treatment options. We talked about medical treatments, surgical interventions and non-pharmacological approaches. I explained in particular the risks and ramifications of untreated moderate to severe OSA, especially with respect to developing cardiovascular disease down the Road, including congestive heart failure, difficult to treat hypertension, cardiac arrhythmias, or stroke. Even type 2 diabetes has, in part, been linked to untreated OSA. Symptoms of untreated OSA include daytime sleepiness, memory problems, mood irritability and mood disorder such as depression and anxiety, lack of energy, as well as recurrent headaches, especially morning headaches. We talked about trying to maintain a healthy lifestyle in general, as well as the importance of weight control. I encouraged the patient to eat healthy, exercise daily and keep well hydrated, to keep a scheduled bedtime and wake time routine, to not skip any meals and eat healthy snacks in between meals. I advised  the patient not to drive when feeling sleepy. I recommended the following at this time:  reduce CPAP pressure to 8 cm. He is encouraged to use the nasal mask and  first try without the chin strap and at the chin strap if absolutely necessary. As a last resort we may try a full facemask down the Road. I explained the importance of being compliant with PAP treatment, not only for insurance purposes but primarily to improve His symptoms, and for the patient's long term health benefit, including to reduce His cardiovascular risks. I answered all his questions today and the patient was in agreement. I would like to see him back in  3 months and encouraged him to email me or call in the interim. I will look out for 30 day download after we have made the pressure change.  

## 2014-09-19 NOTE — Patient Instructions (Signed)
Please continue using your CPAP regularly. While your insurance requires that you use CPAP at least 4 hours each night on 70% of the nights, I recommend, that you not skip any nights and use it throughout the night if you can. Getting used to CPAP and staying with the treatment long term does take time and patience and discipline. Untreated obstructive sleep apnea when it is moderate to severe can have an adverse impact on cardiovascular health and raise her risk for heart disease, arrhythmias, hypertension, congestive heart failure, stroke and diabetes. Untreated obstructive sleep apnea causes sleep disruption, nonrestorative sleep, and sleep deprivation. This can have an impact on your day to day functioning and cause daytime sleepiness and impairment of cognitive function, memory loss, mood disturbance, and problems focussing. Using CPAP regularly can improve these symptoms.  Keep up the good work! I will see you back in 6 months for sleep apnea check up, and if you continue to do well on CPAP I will see you once a year thereafter.   We will decrease your CPAP pressure to 8 cm.

## 2014-12-25 ENCOUNTER — Encounter: Payer: Self-pay | Admitting: Neurology

## 2014-12-25 ENCOUNTER — Ambulatory Visit (INDEPENDENT_AMBULATORY_CARE_PROVIDER_SITE_OTHER): Payer: Medicare Other | Admitting: Neurology

## 2014-12-25 VITALS — BP 140/76 | HR 68 | Resp 16 | Ht 71.0 in | Wt 196.0 lb

## 2014-12-25 DIAGNOSIS — H8110 Benign paroxysmal vertigo, unspecified ear: Secondary | ICD-10-CM

## 2014-12-25 DIAGNOSIS — G4733 Obstructive sleep apnea (adult) (pediatric): Secondary | ICD-10-CM | POA: Diagnosis not present

## 2014-12-25 DIAGNOSIS — Z9989 Dependence on other enabling machines and devices: Principal | ICD-10-CM

## 2014-12-25 DIAGNOSIS — R4189 Other symptoms and signs involving cognitive functions and awareness: Secondary | ICD-10-CM

## 2014-12-25 DIAGNOSIS — H9193 Unspecified hearing loss, bilateral: Secondary | ICD-10-CM | POA: Diagnosis not present

## 2014-12-25 NOTE — Progress Notes (Signed)
Subjective:    Patient ID: Andrew Hayes is a 73 y.o. male.  HPI     Interim history:    Andrew Hayes is a 73-year-old right-handed gentleman with an underlying medical history of prostate cancer diagnosed in 1996, hyperlipidemia, colonic polyps, diverticulosis, plantar fasciitis, vertigo and dizziness, who presents for follow-up consultation of his obstructive sleep apnea. The patient is unaccompanied today. I last saw him on 09/19/2014, at which time that he reported trying to be compliant with treatment. He did not do well with nasal pillows. He had trouble tolerating a chinstrap. He had issues with air leaking from the mask. Overall, however, he felt that he was sleeping better since treatment was started. He denied any significant restless leg symptoms. His balance had improved. For better tolerance of reduce his pressure to 8 cm from 9 cm.   Today, 12/25/2014: I reviewed his CPAP compliance data from 11/24/2014 through 12/23/2014 which is a total of 30 days during which time he used his machine every night with percent used days greater than 4 hours at 97% indicating excellent compliance with an average usage of 5 hours and 38 minutes, setting at 8 cm with EPR of 1. Residual AHI has increased to 7 per hour but mostly because of central apneas. When we look back at his CPAP titration study results from September 2015, he did have central apneas throughout the study on CPAP then but none with any desaturations. His leak fluctuates in the 95th percentile is at 22.2 L/m at this time. He's using a chinstrap every night. He still does not like it. He worries about his memory but has not had any recent decline. He has a family history of dementia and his older brother. He worries if he should be on a statin. He is on low-dose atorvastatin, 10 mg. He has primarily high triglycerides. He has an appointment with his primary care physician coming up for his yearly checkup. He has seen ENT for vertigo. He  has had no recent issues. He does have hearing loss bilaterally.   Previously:  I first met him on 02/14/2014 at the request of his primary care physician, at which time the patient reported non-restorative sleep, snoring, daytime somnolence and nighttime awakenings, essentially since 1996 (after his prostate cancer surgery). I suggested he return for sleep study. He had a baseline sleep study on 04/18/2014 followed by a CPAP titration study on 06/18/2014 and went over his test results with him in detail today. His baseline sleep study showed a reduced sleep efficiency at 72.8% with a latency to sleep normal at 11 minutes and wake after sleep onset elevated at 92 minutes with mild sleep fragmentation noted. The arousal index was mildly elevated. He had an increased percentage of stage II sleep, 3.4% of slow-wave sleep, and REM sleep at 19.3% with a normal REM latency of 96.5 minutes. He had no significant PLMS or EKG changes. He had no significant EEG changes. He had mild intermittent snoring. Total AHI was 8.4 per hour, rising to 12.3 per hour during REM sleep and 15.3 per hour in the supine position. Average oxygen saturation was 95%, nadir was 81% in REM sleep. Based on his sleep related complaints I advised him to return for another sleep study for CPAP titration. His sleep efficiency during the second study was 72.8%. Latency to sleep was normal at 8 minutes and wake after sleep onset was elevated at 91.5 minutes with moderate sleep fragmentation noted. He had a mildly   elevated arousal index. He had a normal percentage of light stage sleep, an increased percentage of deep sleep at 29.4% and a mildly reduced percentage of REM sleep at 16.5% with a normal REM latency. He had mild PLMS at 8.5 per hour with an associated arousal index of 4.5 per hour. He had no significant EKG or EEG changes. He was titrated on CPAP from 5-13 cm. AHI was 1.8 per hour at the pressure of 9. Supine REM sleep was achieved. He did  indicate sleeping better with CPAP during that night.  I reviewed his CPAP compliance data from 08/14/2014 through 09/12/2014 which is a total of 30 days during which time he used his machine 26 days. Percent used days greater than 4 hours was 83%, indicating fairly good compliance. Average usage was 4 hours and 31 minutes. Pressure at 9 cm. Leak was at times high with the 95th percentile at 27.5 L/m. Average AHI was suboptimal at 7.5 per hour. Looking at the breakdown of his AHI it appears that his central apnea index was elevated at 4.2 per hour.   He had seen Dr. Maxwell Caul for OSA before and had a home sleep test on 10/25/2013 which indicated no significant sleep disorder breathing with an AHI estimated at 3 per hour. Time below 89% saturation was 2 minutes. He reports a bedtime of 11:30 to 11:45 PM, and goes to sleep in minutes, with occasional OTC sleep aid, but not on a night to night basis. He wakes up 1-2 times per night, and sometimes his mind races and he feels it helps to turn his radio on for distraction. He goes to the bathroom 0-1 times per night. His wife's sleep used to disturb him, because of her snoring and restlessness, but they have been sleeping in separate bedrooms for years. He does not wake up with headaches. He snores, but did not used to snore. He has been able to lose weight and plays golf 2 times a week, goes to the gym 2 times per week. He likes fly fishing. He does not wake up rested. His rise time is between 5:30 to 6 AM. He rarely dreams. He is sleepy during the day, but does not take a nap on a scheduled basis. If he lies down in the afternoon, he will go to sleep. His ESS is 14/24 today. He has fallen asleep at the wheel x 2, both of which happened after a long day of fishing on a longer drive home. He has not had a MVA and no dozing off at the wheel in the last 3 years.   There is no report of nighttime reflux, with no nighttime cough experienced. The patient has not noted any  RLS symptoms and is not known to kick while asleep or before falling asleep. There is no family history of RLS or OSA. Father was a heavy snorer, mother had some sleep disruption.    He is not a very restless sleeper.    He denies cataplexy, sleep paralysis, hypnagogic or hypnopompic hallucinations, or sleep attacks. He does not report any vivid dreams, nightmares, dream enactments, or parasomnias, such as sleep talking or sleep walking. The patient has not had an attended sleep study.   He consumes 2 to 3 caffeinated beverages per day, usually in the form of coffee in the morning and ice tea with meals some time.   His bedroom is usually dark and cool. There is no TV in the bedroom and he uses a radio, with  low volume.   He quit smoking in '86 and drinks 1 beer per day.    His Past Medical History Is Significant For: Past Medical History  Diagnosis Date  . Organic impotence   . Lipidemia     Mixed  . Mild sleep apnea   . Diverticulosis     Mild  . Elevated fasting glucose     Mildly  . Plantar fasciitis   . Junctional nevus   . Actinic keratoses   . Herniated disc   . Prostate cancer     His Past Surgical History Is Significant For: Past Surgical History  Procedure Laterality Date  . Colonoscopy    . Tonsillectomy    . Appendectomy    . Prostatectomy  1986  . Knee arthroscopy  2013    knee and elbow  . Elbow surgery  2013    His Family History Is Significant For: Family History  Problem Relation Age of Onset  . Stroke Mother   . Hypertension Mother   . Parkinson's disease Mother   . COPD Father   . Dementia Brother   . Hydrocephalus Brother   . Parkinson's disease Brother   . Parkinson's disease Sister     His Social History Is Significant For: History   Social History  . Marital Status: Married    Spouse Name: Brenda  . Number of Children: 2  . Years of Education: 16   Occupational History  . cpa     retired   Social History Main Topics  . Smoking  status: Former Smoker -- 2.00 packs/day for 24 years    Types: Cigarettes    Quit date: 10/11/1984  . Smokeless tobacco: Former User  . Alcohol Use: 0.0 oz/week    0 Standard drinks or equivalent per week     Comment: 8-10 beers weekly  . Drug Use: No  . Sexual Activity: Not on file   Other Topics Concern  . None   Social History Narrative   Patient lives at home with family.   Patient consumes 2-3 cups of caffeine daily,right handed.   2 children (1 deceased)    His Allergies Are:  No Known Allergies:   His Current Medications Are:  Outpatient Encounter Prescriptions as of 12/25/2014  Medication Sig  . cholecalciferol (VITAMIN D) 1000 UNITS tablet Take 1,000 Units by mouth daily.  . co-enzyme Q-10 30 MG capsule Take 30 mg by mouth 3 (three) times daily.  . aspirin 81 MG tablet Take 81 mg by mouth daily.  . atorvastatin (LIPITOR) 10 MG tablet Take 1 tablet by mouth daily.  . Multiple Vitamin (MULTI-VITAMIN PO) Take by mouth daily.  . Omega-3 Fatty Acids (FISH OIL PO) Take by mouth daily.  :  Review of Systems:  Out of a complete 14 point review of systems, all are reviewed and negative with the exception of these symptoms as listed below:   Review of Systems  All other systems reviewed and are negative.   Objective:  Neurologic Exam  Physical Exam  Physical Examination:   Filed Vitals:   12/25/14 1106  BP: 140/76  Pulse: 68  Resp: 16    General Examination: The patient is a very pleasant 73 y.o. male in no acute distress. He appears well-developed and well-nourished and well groomed. He is in good spirits today.  HEENT: Normocephalic, atraumatic, pupils are equal, round and reactive to light and accommodation. Funduscopic exam is normal with sharp disc margins noted. Extraocular tracking is good   without limitation to gaze excursion or nystagmus noted. Normal smooth pursuit is noted. Hearing is impaired. Face is symmetric with normal facial animation and normal  facial sensation. Speech is clear with no dysarthria noted. There is no hypophonia. There is no lip, neck/head, jaw or voice tremor. Neck is supple with full range of passive and active motion. There are no carotid bruits on auscultation. Oropharynx exam reveals: mild mouth dryness, adequate dental hygiene and moderate airway crowding, due to longer tongue and redundant soft palate and longer uvula. Mallampati is class II. Tongue protrudes centrally and palate elevates symmetrically. Tonsils are absent.    Chest: Clear to auscultation without wheezing, rhonchi or crackles noted.  Heart: S1+S2+0, regular and normal without murmurs, rubs or gallops noted.   Abdomen: Soft, non-tender and non-distended with normal bowel sounds appreciated on auscultation.  Extremities: There is no pitting edema in the distal lower extremities bilaterally. Pedal pulses are intact.  Skin: Warm and dry without trophic changes noted. There are no varicose veins.  Musculoskeletal: exam reveals no obvious joint deformities, tenderness or joint swelling or erythema.   Neurologically:  Mental status: The patient is awake, alert and oriented in all 4 spheres. His immediate and remote memory, attention, language skills and fund of knowledge are appropriate. There is no evidence of aphasia, agnosia, apraxia or anomia. Speech is clear with normal prosody and enunciation. Thought process is linear. Mood is normal and affect is normal.  Cranial nerves II - XII are as described above under HEENT exam. In addition: shoulder shrug is normal with equal shoulder height noted. Motor exam: Normal bulk, strength and tone is noted. There is no drift, tremor or rebound. Romberg is negative. Reflexes are 2+ throughout. Babinski: Toes are flexor bilaterally. Fine motor skills and coordination: intact with normal finger taps, normal hand movements, normal rapid alternating patting, normal foot taps and normal foot agility.  Cerebellar testing:  No dysmetria or intention tremor on finger to nose testing. Heel to shin is unremarkable bilaterally. There is no truncal or gait ataxia.  Sensory exam: intact to light touch, pinprick, vibration, temperature sense in the upper and lower extremities.  Gait, station and balance: He stands easily. No veering to one side is noted. No leaning to one side is noted. Posture is age-appropriate and stance is narrow based. Gait shows normal stride length and normal pace. No problems turning are noted. He turns en bloc. Tandem walk is slightly difficult.  Assessment and Plan:   In summary, Andrew Hayes is a very pleasant 73-year old male with an underlying medical history of prostate cancer diagnosed in 1996, hyperlipidemia, colonic polyps, diverticulosis, plantar fasciitis, vertigo and dizziness, who presents for follow-up consultation of his obstructive sleep apnea, on treatment with CPAP therapy. He maintains excellent compliance with treatment. He actually has noted improvement in his sleep. He feels that he goes back to sleep easier. We looked at his prior sleep study results from July and September 2015 again today as well as discussed his compliance data. He still struggles to keep the chin strap on. Leak has been okay. Since we've reduced his pressure last time from 9-8 cm his AHI has increased but he has primarily central apneas and we know that when he had central apneas during the CPAP titration study in September he had no significant desaturations which is reassuring. He is encouraged to continue using CPAP at the current setting of 8 cm with the current mask and try the chin strap as   best as he can. He is congratulated on his excellent treatment adherence. He feels improved with his sleep and is hopeful that he can get to a point where he can tolerate the mask and pressure fully.  As far as his memory complaints, we will continue to monitor. He does endorse that he has been stable in that regard. He  just worries a lot about dementia because of his brothers history. As far as his hypertriglyceridemia, he is encouraged to talk to his primary care physician about alternatives to statin. Maybe he can take niacin or fenofibrate. I would like to see him back in 4 months from now, sooner if the need arises. I answered all his questions today and the patient was in agreement. I encouraged him to email me or call in the interim for questions or concerns. I spent 25 minutes in total face-to-face time with the patient, more than 50% of which was spent in counseling and coordination of care, reviewing test results, reviewing medication and discussing or reviewing the diagnosis of OSA, memory loss, the prognosis and treatment options.   

## 2014-12-25 NOTE — Patient Instructions (Addendum)
Let's continue with the current CPAP settings; I think you are doing very well with it!  Keep up the good work! I will see you back in 4 months for sleep apnea check up.

## 2014-12-27 ENCOUNTER — Encounter: Payer: Self-pay | Admitting: Neurology

## 2015-04-18 ENCOUNTER — Other Ambulatory Visit: Payer: Self-pay | Admitting: Otolaryngology

## 2015-04-18 DIAGNOSIS — H9190 Unspecified hearing loss, unspecified ear: Secondary | ICD-10-CM

## 2015-04-18 DIAGNOSIS — H8109 Meniere's disease, unspecified ear: Secondary | ICD-10-CM

## 2015-04-28 ENCOUNTER — Ambulatory Visit: Payer: Medicare Other | Admitting: Neurology

## 2015-05-02 ENCOUNTER — Ambulatory Visit: Payer: Medicare Other | Admitting: Neurology

## 2015-05-02 ENCOUNTER — Ambulatory Visit
Admission: RE | Admit: 2015-05-02 | Discharge: 2015-05-02 | Disposition: A | Discharge status: Home / Self Care | Payer: Medicare Other | Source: Ambulatory Visit | Attending: Otolaryngology | Admitting: Otolaryngology

## 2015-05-02 DIAGNOSIS — H9190 Unspecified hearing loss, unspecified ear: Secondary | ICD-10-CM

## 2015-05-02 DIAGNOSIS — H8109 Meniere's disease, unspecified ear: Secondary | ICD-10-CM

## 2015-05-02 MED ORDER — GADOBENATE DIMEGLUMINE 529 MG/ML IV SOLN
17.0000 mL | Freq: Once | INTRAVENOUS | Status: AC | PRN
  Administered 2015-05-02: 17 mL via INTRAVENOUS

## 2015-05-20 ENCOUNTER — Encounter: Payer: Self-pay | Admitting: Physician Assistant

## 2015-05-20 ENCOUNTER — Telehealth: Payer: Self-pay

## 2015-05-20 NOTE — Telephone Encounter (Signed)
I left a message on both contact numbers to call back. Patient has 67min appt tomorrow, but we received new referral for a new problem. I would like to ask patient if he wants to move his appt to Friday (I have put a hold on one time slot). That way he can have 5min appt to go over CPAP use and the new problem he has been referred for.

## 2015-05-21 ENCOUNTER — Ambulatory Visit: Payer: Medicare Other | Admitting: Neurology

## 2015-05-23 ENCOUNTER — Ambulatory Visit (INDEPENDENT_AMBULATORY_CARE_PROVIDER_SITE_OTHER): Payer: Medicare Other | Admitting: Neurology

## 2015-05-23 ENCOUNTER — Encounter: Payer: Self-pay | Admitting: Neurology

## 2015-05-23 VITALS — BP 155/89 | HR 63 | Resp 16 | Ht 71.0 in | Wt 198.0 lb

## 2015-05-23 DIAGNOSIS — H9193 Unspecified hearing loss, bilateral: Secondary | ICD-10-CM

## 2015-05-23 DIAGNOSIS — R42 Dizziness and giddiness: Secondary | ICD-10-CM | POA: Diagnosis not present

## 2015-05-23 DIAGNOSIS — Z9989 Dependence on other enabling machines and devices: Secondary | ICD-10-CM

## 2015-05-23 DIAGNOSIS — G4733 Obstructive sleep apnea (adult) (pediatric): Secondary | ICD-10-CM | POA: Diagnosis not present

## 2015-05-23 DIAGNOSIS — G9389 Other specified disorders of brain: Secondary | ICD-10-CM

## 2015-05-23 DIAGNOSIS — R4189 Other symptoms and signs involving cognitive functions and awareness: Secondary | ICD-10-CM

## 2015-05-23 NOTE — Patient Instructions (Signed)
We will monitor your symptoms. We will do another MRI brain in 6 months and consider evaluation for NPH with a spinal tap.  We will monitor your memory.  Your exam and history does not suggest NPH (normal pressure hydrocephalus). Again, we will monitor symptoms and compare MRIs.   Please continue using your CPAP regularly. While your insurance requires that you use CPAP at least 4 hours each night on 70% of the nights, I recommend, that you not skip any nights and use it throughout the night if you can. Getting used to CPAP and staying with the treatment long term does take time and patience and discipline. Untreated obstructive sleep apnea when it is moderate to severe can have an adverse impact on cardiovascular health and raise her risk for heart disease, arrhythmias, hypertension, congestive heart failure, stroke and diabetes. Untreated obstructive sleep apnea causes sleep disruption, nonrestorative sleep, and sleep deprivation. This can have an impact on your day to day functioning and cause daytime sleepiness and impairment of cognitive function, memory loss, mood disturbance, and problems focussing. Using CPAP regularly can improve these symptoms.  Remember to drink plenty of fluid, eat healthy meals and do not skip any meals. Try to eat protein with a every meal and eat a healthy snack such as fruit or nuts in between meals. Try to keep a regular sleep-wake schedule and try to exercise daily, particularly in the form of walking, 20-30 minutes a day, if you can.  As far as diagnostic testing, I will order: a repeat a brain MRI.   I would like to see you back in 6 months, sooner if we need to.   Our phone number is [phone]. We also have an after hours call service for urgent matters and there is a physician on-call for urgent questions, that cannot wait till the next work day. For any emergencies you know to call 911 or go to the nearest emergency room.

## 2015-05-23 NOTE — Progress Notes (Signed)
Subjective:    Patient ID: Andrew Hayes is a 74 y.o. male.  HPI    Interim history:  Andrew Hayes is a 74 year old right-handed gentleman with an underlying medical history of prostate cancer diagnosed in 1996, hyperlipidemia, colonic polyps, diverticulosis, plantar fasciitis, vertigo and dizziness, who presents for a new problem and is referred for concern for hydrocephalus. The patient is unaccompanied today. I last saw him on 12/25/2014 for follow-up of his OSA. He was compliant with CPAP therapy at the time.   Today, 05/23/2015: I reviewed his CPAP compliance data from 04/22/2015 through 05/21/2015 which is a total of 30 days during which time he used his machine 22 days with percent used days greater than 4 hours at 70%, indicating adequate compliance with an average usage of 4 hours and 20 minutes. Residual AHI borderline at 5.8 per hour, leaked low with the 95th percentile at 16.9 L/m on a pressure of 8 cm.  Today, 05/23/2015: He reports that in the past few months he has had 2 episodes of vertigo. He has a history of vertigo for over 30 years but usually they were infrequent spells. He has had some intermittent difficulty with swallowing and that dry things such as granola felt like it was stuck. He is scheduled for swallowing evaluation later this month. He does drink enough water he feels. He likes to stay active. He plays golf, he goes to the gym, he also likes fishing. He usually takes water with him when he is outside. He had some abnormal sensations in his feet which is difficult for him to describe. He does not feel pins and needles sensation or numbness. Sometimes it just feels different at the bottom of his feet. He drinks 1 beer per day but has never been a heavy drinker. He quit smoking about 30 years ago. He admits that he was a heavy smoker in the past. Because of his most recent and most severe spell of vertigo he had a brain MRI with and without contrast on 05/02/2015: No  imaging explanation for vestibular cochlear symptoms as above. Ventriculomegaly. Are there any symptoms of normal pressure hydrocephalus? In addition, I personally reviewed the images through the PACS system and I do agree that he does have widened ventricles in the absence of significant atrophy. I shared images with him on my computer. He is referred by his ENT specialist, Dr. Janace Hoard. He follows with Dr. Janace Hoard for possible Mnire's disease. He had 2 recent episodes of vertigo in May and early July 2016. He feels that his memory is not as good. Over the past few years he has had mild memory issues such as forgetfulness. Of note, his older brother who died at the age of 5 a few years ago had NPH and had a shunt placed which as the patient recalls helped in the beginning but not after a few years. His brother had memory loss but also did not take care of himself very well per patient. He is compliant with CPAP treatment. He feels well with it. He does admit that he skip days when he was out of town.  Previously:   I saw him on 09/19/2014, at which time that he reported trying to be compliant with treatment. He did not do well with nasal pillows. He had trouble tolerating a chinstrap. He had issues with air leaking from the mask. Overall, however, he felt that he was sleeping better since treatment was started. He denied any significant restless leg symptoms.  His balance had improved. For better tolerance of reduce his pressure to 8 cm from 9 cm.   I reviewed his CPAP compliance data from 11/24/2014 through 12/23/2014 which is a total of 30 days during which time he used his machine every night with percent used days greater than 4 hours at 97% indicating excellent compliance with an average usage of 5 hours and 38 minutes, setting at 8 cm with EPR of 1. Residual AHI has increased to 7 per hour but mostly because of central apneas. When we look back at his CPAP titration study results from September 2015, he  did have central apneas throughout the study on CPAP then but none with any desaturations. His leak fluctuates in the 95th percentile is at 22.2 L/m at this time. He's using a chinstrap every night. He still does not like it. He worries about his memory but has not had any recent decline. He has a family history of dementia and his older brother. He worries if he should be on a statin. He is on low-dose atorvastatin, 10 mg. He has primarily high triglycerides. He has an appointment with his primary care physician coming up for his yearly checkup. He has seen ENT for vertigo. He has had no recent issues. He does have hearing loss bilaterally.   I first met him on 02/14/2014 at the request of his primary care physician, at which time the patient reported non-restorative sleep, snoring, daytime somnolence and nighttime awakenings, essentially since 1996 (after his prostate cancer surgery). I suggested he return for sleep study. He had a baseline sleep study on 04/18/2014 followed by a CPAP titration study on 06/18/2014 and went over his test results with him in detail today. His baseline sleep study showed a reduced sleep efficiency at 72.8% with a latency to sleep normal at 11 minutes and wake after sleep onset elevated at 92 minutes with mild sleep fragmentation noted. The arousal index was mildly elevated. He had an increased percentage of stage II sleep, 3.4% of slow-wave sleep, and REM sleep at 19.3% with a normal REM latency of 96.5 minutes. He had no significant PLMS or EKG changes. He had no significant EEG changes. He had mild intermittent snoring. Total AHI was 8.4 per hour, rising to 12.3 per hour during REM sleep and 15.3 per hour in the supine position. Average oxygen saturation was 95%, nadir was 81% in REM sleep. Based on his sleep related complaints I advised him to return for another sleep study for CPAP titration. His sleep efficiency during the second study was 72.8%. Latency to sleep was normal  at 8 minutes and wake after sleep onset was elevated at 91.5 minutes with moderate sleep fragmentation noted. He had a mildly elevated arousal index. He had a normal percentage of light stage sleep, an increased percentage of deep sleep at 29.4% and a mildly reduced percentage of REM sleep at 16.5% with a normal REM latency. He had mild PLMS at 8.5 per hour with an associated arousal index of 4.5 per hour. He had no significant EKG or EEG changes. He was titrated on CPAP from 5-13 cm. AHI was 1.8 per hour at the pressure of 9. Supine REM sleep was achieved. He did indicate sleeping better with CPAP during that night.  I reviewed his CPAP compliance data from 08/14/2014 through 09/12/2014 which is a total of 30 days during which time he used his machine 26 days. Percent used days greater than 4 hours was 83%, indicating fairly good  compliance. Average usage was 4 hours and 31 minutes. Pressure at 9 cm. Leak was at times high with the 95th percentile at 27.5 L/m. Average AHI was suboptimal at 7.5 per hour. Looking at the breakdown of his AHI it appears that his central apnea index was elevated at 4.2 per hour.   He had seen Dr. Maxwell Caul for OSA before and had a home sleep test on 10/25/2013 which indicated no significant sleep disorder breathing with an AHI estimated at 3 per hour. Time below 89% saturation was 2 minutes. He reports a bedtime of 11:30 to 11:45 PM, and goes to sleep in minutes, with occasional OTC sleep aid, but not on a night to night basis. He wakes up 1-2 times per night, and sometimes his mind races and he feels it helps to turn his radio on for distraction. He goes to the bathroom 0-1 times per night. His wife's sleep used to disturb him, because of her snoring and restlessness, but they have been sleeping in separate bedrooms for years. He does not wake up with headaches. He snores, but did not used to snore. He has been able to lose weight and plays golf 2 times a week, goes to the gym 2  times per week. He likes fly fishing. He does not wake up rested. His rise time is between 5:30 to 6 AM. He rarely dreams. He is sleepy during the day, but does not take a nap on a scheduled basis. If he lies down in the afternoon, he will go to sleep. His ESS is 14/24 today. He has fallen asleep at the wheel x 2, both of which happened after a long day of fishing on a longer drive home. He has not had a MVA and no dozing off at the wheel in the last 3 years.   There is no report of nighttime reflux, with no nighttime cough experienced. The patient has not noted any RLS symptoms and is not known to kick while asleep or before falling asleep. There is no family history of RLS or OSA. Father was a heavy snorer, mother had some sleep disruption.    He is not a very restless sleeper.    He denies cataplexy, sleep paralysis, hypnagogic or hypnopompic hallucinations, or sleep attacks. He does not report any vivid dreams, nightmares, dream enactments, or parasomnias, such as sleep talking or sleep walking. The patient has not had an attended sleep study.   He consumes 2 to 3 caffeinated beverages per day, usually in the form of coffee in the morning and ice tea with meals some time.   His bedroom is usually dark and cool. There is no TV in the bedroom and he uses a radio, with low volume.   He quit smoking in '86 and drinks 1 beer per day.    His Past Medical History Is Significant For: Past Medical History  Diagnosis Date  . Organic impotence   . Lipidemia     Mixed  . Mild sleep apnea   . Diverticulosis     Mild  . Elevated fasting glucose     Mildly  . Plantar fasciitis   . Junctional nevus   . Actinic keratoses   . Herniated disc   . Prostate cancer     His Past Surgical History Is Significant For: Past Surgical History  Procedure Laterality Date  . Colonoscopy    . Tonsillectomy    . Appendectomy    . Prostatectomy  1986  . Knee  arthroscopy  2013    knee and elbow  . Elbow surgery   2013    His Family History Is Significant For: Family History  Problem Relation Age of Onset  . Stroke Mother   . Hypertension Mother   . Parkinson's disease Mother   . COPD Father   . Dementia Brother   . Hydrocephalus Brother   . Parkinson's disease Brother   . Parkinson's disease Sister     His Social History Is Significant For: Social History   Social History  . Marital Status: Married    Spouse Name: Hassan Rowan  . Number of Children: 2  . Years of Education: 16   Occupational History  . cpa     retired   Social History Main Topics  . Smoking status: Former Smoker -- 2.00 packs/day for 24 years    Types: Cigarettes    Quit date: 10/11/1984  . Smokeless tobacco: Former Systems developer  . Alcohol Use: 0.0 oz/week    0 Standard drinks or equivalent per week     Comment: 8-10 beers weekly  . Drug Use: No  . Sexual Activity: Not Asked   Other Topics Concern  . None   Social History Narrative   Patient lives at home with family.   Patient consumes 2-3 cups of caffeine daily,right handed.   2 children (1 deceased)    His Allergies Are:  Allergies  Allergen Reactions  . Percocet [Oxycodone-Acetaminophen] Other (See Comments)    Patient states he is unable to tolerate  :   His Current Medications Are:  Outpatient Encounter Prescriptions as of 05/23/2015  Medication Sig  . aspirin 81 MG tablet Take 81 mg by mouth daily.  Marland Kitchen atorvastatin (LIPITOR) 10 MG tablet Take 1 tablet by mouth daily.  Marland Kitchen co-enzyme Q-10 30 MG capsule Take 30 mg by mouth 3 (three) times daily.  . Multiple Vitamin (MULTI-VITAMIN PO) Take by mouth daily.  . Omega-3 Fatty Acids (FISH OIL PO) Take by mouth daily.  . [DISCONTINUED] cholecalciferol (VITAMIN D) 1000 UNITS tablet Take 1,000 Units by mouth daily.   No facility-administered encounter medications on file as of 05/23/2015.  :  Review of Systems:  Out of a complete 14 point review of systems, all are reviewed and negative with the exception of  these symptoms as listed below:   Review of Systems  HENT: Positive for trouble swallowing.        Swallow test scheduled   Neurological: Positive for dizziness.       Patient would like to go over results of recent MRI.  Patient reports a "different sensation" on the bottom of his feet as he walks.  States that he had 2 episodes of vertigo- May and July of this year.     Objective:  Neurologic Exam  Physical Exam Physical Examination:   Filed Vitals:   05/23/15 0936  BP: 155/89  Pulse: 63  Resp: 16    General Examination: The patient is a very pleasant 74 y.o. male in no acute distress. He appears well-developed and well-nourished and well groomed. He is slightly anxious today.   HEENT: Normocephalic, atraumatic, pupils are equal, round and reactive to light and accommodation. Funduscopic exam is normal with sharp disc margins noted. Extraocular tracking is good without limitation to gaze excursion or nystagmus noted. Normal smooth pursuit is noted. Hearing is impaired. Face is symmetric with normal facial animation and normal facial sensation. Speech is clear with no dysarthria noted. There is no hypophonia. There  is no lip, neck/head, jaw or voice tremor. Neck is supple with full range of passive and active motion. There are no carotid bruits on auscultation. Oropharynx exam reveals: mild mouth dryness, adequate dental hygiene and moderate airway crowding, due to longer tongue and redundant soft palate and longer uvula. Mallampati is class II. Tongue protrudes centrally and palate elevates symmetrically. Tonsils are absent.    Chest: Clear to auscultation without wheezing, rhonchi or crackles noted.  Heart: S1+S2+0, regular and normal without murmurs, rubs or gallops noted.   Abdomen: Soft, non-tender and non-distended with normal bowel sounds appreciated on auscultation.  Extremities: There is no pitting edema in the distal lower extremities bilaterally. Pedal pulses are  intact.  Skin: Warm and dry without trophic changes noted. There are no varicose veins.  Musculoskeletal: exam reveals no obvious joint deformities, tenderness or joint swelling or erythema.   Neurologically:  Mental status: The patient is awake, alert and oriented in all 4 spheres. His immediate and remote memory, attention, language skills and fund of knowledge are appropriate. There is no evidence of aphasia, agnosia, apraxia or anomia. Speech is clear with normal prosody and enunciation. Thought process is linear. Mood is normal and affect is normal.  Cranial nerves II - XII are as described above under HEENT exam. In addition: shoulder shrug is normal with equal shoulder height noted. Motor exam: Normal bulk, strength and tone is noted. There is no drift, tremor or rebound. Romberg is negative. Reflexes are 2+ throughout. Babinski: Toes are flexor bilaterally. Fine motor skills and coordination: intact with normal finger taps, normal hand movements, normal rapid alternating patting, normal foot taps and normal foot agility.  Cerebellar testing: No dysmetria or intention tremor on finger to nose testing. Heel to shin is unremarkable bilaterally. There is no truncal or gait ataxia.  Sensory exam: intact to light touch, pinprick, vibration, temperature sense and proprioception in the upper and lower extremities.  Gait, station and balance: He stands easily. No veering to one side is noted. No leaning to one side is noted. Posture is age-appropriate and stance is narrow based. Gait shows normal stride length and normal pace. No problems turning are noted. He turns en bloc. Tandem walk is slightly difficult, unchanged. He has normal toe stance and heel stance.  Assessment and Plan:   In summary, Andrew Hayes is a very pleasant 74 year old male with an underlying medical history of prostate cancer diagnosed in 1996, hyperlipidemia, colonic polyps, diverticulosis, plantar fasciitis, vertigo,  dizziness, and obstructive sleep apnea on CPAP therapy with good compliance, who presents for a new problem and is referred by his ENT physician, Dr. Janace Hoard. He was found to have ventriculomegaly. I agree that his MRI showed ventriculomegaly and mild white matter changes. I explained white matter changes to him as well. His symptoms and history does not suggest normal pressure hydrocephalus. Nevertheless, we will monitor his memory and his symptoms. He is primarily reassured today. I would like to follow him clinically. We mutually agreed not to do any invasive testing such as spinal fluid testing and large volume spinal tap in particular. He is familiar somewhat with a diagnosis of NPH because his brother had it and he had a shunt placed which worked for a few years as I understand. The patient is advised that I would like to compare MRI findings down the [place]. To that end, we mutually agreed that we will repeat MRI testing in about 6 months and follow him clinically. In the  interim, should he have any new symptoms we will certainly see him sooner. He has a swallow study scheduled for later this month. His exam is benign. He is again advised to continue to stay compliant with CPAP therapy   As far as his memory complaints, we will continue to monitor. He does endorse that he has been stable in that regard. He worries a lot about dementia because of his brother's history. For his hypertriglyceridemia, he is on low-dose Lipitor generic.  I talked to the patient about keeping a healthy lifestyle in general. He is encouraged to continue to exercise regularly, keep well hydrated and eat nutritious food.  I answered all his questions today and the patient was in agreement. I encouraged him to email me or call in the interim for questions or concerns. I spent 40 minutes in total face-to-face time with the patient, more than 50% of which was spent in counseling and coordination of care, reviewing test results,  reviewing medication and discussing or reviewing the diagnosis of  NPH, ventriculomegaly, memory loss, OSA, the prognosis and treatment options.

## 2015-06-09 ENCOUNTER — Encounter: Payer: Self-pay | Admitting: Physician Assistant

## 2015-06-09 ENCOUNTER — Ambulatory Visit (INDEPENDENT_AMBULATORY_CARE_PROVIDER_SITE_OTHER): Payer: Medicare Other | Admitting: Physician Assistant

## 2015-06-09 VITALS — BP 150/80 | HR 60 | Ht 71.0 in | Wt 195.5 lb

## 2015-06-09 DIAGNOSIS — R1310 Dysphagia, unspecified: Secondary | ICD-10-CM | POA: Diagnosis not present

## 2015-06-09 DIAGNOSIS — Z860100 Personal history of colon polyps, unspecified: Secondary | ICD-10-CM

## 2015-06-09 NOTE — Patient Instructions (Signed)
You have been scheduled for a Barium Esophogram at [facility] (1st floor of the hospital) on 06/12/15 at 11:30am. Please arrive 15 minutes prior to your appointment for registration. Make certain not to have anything to eat or drink 6 hours prior to your test. If you need to reschedule for any reason, please contact radiology at [phone] to do so. __________________________________________________________________ A barium swallow is an examination that concentrates on views of the esophagus. This tends to be a double contrast exam (barium and two liquids which, when combined, create a gas to distend the wall of the oesophagus) or single contrast (non-ionic iodine based). The study is usually tailored to your symptoms so a good history is essential. Attention is paid during the study to the form, structure and configuration of the esophagus, looking for functional disorders (such as aspiration, dysphagia, achalasia, motility and reflux) EXAMINATION You may be asked to change into a gown, depending on the type of swallow being performed. A radiologist and radiographer will perform the procedure. The radiologist will advise you of the type of contrast selected for your procedure and direct you during the exam. You will be asked to stand, sit or lie in several different positions and to hold a small amount of fluid in your mouth before being asked to swallow while the imaging is performed .In some instances you may be asked to swallow barium coated marshmallows to assess the motility of a solid food bolus. The exam can be recorded as a digital or video fluoroscopy procedure. POST PROCEDURE It will take 1-2 days for the barium to pass through your system. To facilitate this, it is important, unless otherwise directed, to increase your fluids for the next 24-48hrs and to resume your normal diet.  This test typically takes about 30 minutes to  perform. __________________________________________________________________________________  Andrew Hayes have been scheduled for an endoscopy. Please follow written instructions given to you at your visit today. If you use inhalers (even only as needed), please bring them with you on the day of your procedure. Your physician has requested that you go to www.startemmi.com and enter the access code given to you at your visit today. This web site gives a general overview about your procedure. However, you should still follow specific instructions given to you by our office regarding your preparation for the procedure.  cc: [name], MD

## 2015-06-09 NOTE — Progress Notes (Signed)
Patient ID: Andrew Hayes, male   DOB: 30-Jun-1941, 74 y.o.   MRN: [id number]    HPI:  Andrew Hayes is a 74 y.o.   male referred by [name], MD for evaluation of dysphagia. Mr. Hustead is known to Dr. Fuller Plan from previous colonoscopy. He had a colonoscopy in December 2006 at which time adenomatous polyps were removed. Surveillance colonoscopy in December 2011 showed mild diverticulosis in the sigmoid no recurrent polyps. He was advised to have surveillance colonoscopy in December 2016. He has a past medical history of mixed lipidemia, sleep apnea, vertigo, Mnire's disease, prostate cancer, actinic keratosis, and herniated disc. He has had an abnormal MR of the head showing ventricular low megaly possibly, however symptoms were felt to be more consistent with Mnire's disease with decades of intermittent vertigo, non-positional. Patient has follow-up with his neurologist for further studies.  He presents today with a 4-6 month history of progressive dysphagia to dry foods. He states that 4-6 months ago he developed difficulty swallowing these foods and it is becoming more frequent. He is able to push dry foods like bread, crackers, and rice down with liquids. He has not had to spit his food up. He has no difficulty swallowing liquids. He denies heartburn, belching, or burping, but admits to occasional regurgitation. He denies epigastric pain or early satiety. His appetite has been good and his weight has been stable. He's had no change in his bowel habits her stool caliber and denies bright red blood per rectum or melena.     Past Medical History  Diagnosis Date  . Organic impotence   . Lipidemia     Mixed  . Mild sleep apnea   . Diverticulosis     Mild  . Elevated fasting glucose     Mildly  . Plantar fasciitis   . Junctional nevus   . Actinic keratoses   . Herniated disc   . Prostate cancer     Past Surgical History  Procedure Laterality Date  . Colonoscopy    .  Tonsillectomy    . Appendectomy    . Prostatectomy  1986  . Knee arthroscopy  2013    knee and elbow  . Elbow surgery  2013   Family History  Problem Relation Age of Onset  . Stroke Mother   . Hypertension Mother   . Parkinson's disease Mother   . COPD Father   . Dementia Brother   . Hydrocephalus Brother   . Parkinson's disease Brother   . Parkinson's disease Sister    Social History  Substance Use Topics  . Smoking status: Former Smoker -- 2.00 packs/day for 24 years    Types: Cigarettes    Quit date: 10/11/1984  . Smokeless tobacco: Former Systems developer  . Alcohol Use: 4.2 oz/week    7 Standard drinks or equivalent per week     Comment: 7 beers weekly   Current Outpatient Prescriptions  Medication Sig Dispense Refill  . aspirin 81 MG tablet Take 81 mg by mouth daily.    Marland Kitchen atorvastatin (LIPITOR) 10 MG tablet Take 1 tablet by mouth daily.    Marland Kitchen co-enzyme Q-10 30 MG capsule Take 30 mg by mouth 3 (three) times daily.    . Multiple Vitamin (MULTI-VITAMIN PO) Take by mouth daily.    . Omega-3 Fatty Acids (FISH OIL PO) Take by mouth daily.     No current facility-administered medications for this visit.   Allergies  Allergen Reactions  . Percocet [Oxycodone-Acetaminophen]  Other (See Comments)    Patient states he is unable to tolerate     Review of Systems: Gen: Denies any fever, chills, sweats, anorexia, fatigue, weakness, malaise, weight loss, and sleep disorder CV: Denies chest pain, angina, palpitations, syncope, orthopnea, PND, peripheral edema, and claudication. Resp: Denies dyspnea at rest, dyspnea with exercise, cough, sputum, wheezing, coughing up blood, and pleurisy. GI: Denies vomiting blood, jaundice, and fecal incontinence.  Has dysphagia to dry foods. GU : Denies urinary burning, blood in urine, urinary frequency, urinary hesitancy, nocturnal urination, and urinary incontinence. MS: Denies joint pain, limitation of movement, and swelling, stiffness, low back pain,  extremity pain. Denies muscle weakness, cramps, atrophy.  Derm: Denies rash, itching, dry skin, hives, moles, warts, or unhealing ulcers.  Psych: Denies depression, anxiety, memory loss, suicidal ideation, hallucinations, paranoia, and confusion. Heme: Denies bruising, bleeding, and enlarged lymph nodes. Neuro:  Denies any headaches, dizziness, paresthesias. Endo:  Denies any problems with DM, thyroid, adrenal function    Prior Endoscopies:  See history of present illness  Physical Exam: BP 150/80 mmHg  Pulse 60  Ht 5\' 11"  (1.803 m)  Wt 195 lb 8 oz (88.678 kg)  BMI 27.28 kg/m2 Constitutional: Pleasant,well-developed, male in no acute distress. HEENT: Normocephalic and atraumatic. Conjunctivae are normal. No scleral icterus. Neck supple.  Cardiovascular: Normal rate, regular rhythm.  Pulmonary/chest: Effort normal and breath sounds normal. No wheezing, rales or rhonchi. Abdominal: Soft, nondistended, nontender. Bowel sounds active throughout. There are no masses palpable. No hepatomegaly. Extremities: no edema Lymphadenopathy: No cervical adenopathy noted. Neurological: Alert and oriented to person place and time. Skin: Skin is warm and dry. No rashes noted. Psychiatric: Normal mood and affect. Behavior is normal.  ASSESSMENT AND PLAN: #1 dysphagia. Patient has been experiencing dysphagia to dry foods for 4-6 months. Have discussed how this may be due to some uncontrolled GERD, however the patient is adamant that he has not been having reflux symptoms and prefers not to start any medication at this time. A barium swallow with tablet will be obtained to evaluate for possible stricture, dysmotility, etc. He will also be scheduled for an EGD to evaluate for esophagitis, gastritis, ulcer, stricture etc.The risks, benefits, and alternatives to endoscopy with possible biopsy and possible dilation were discussed with the patient and they consent to proceed.    #2. Personal history of colon  polyps. Patient will be due for surveillance colonoscopy in December 2016. We have discussed the possibility of scheduling his colonoscopy at the same setting as his EGD, however the patient prefers to wait until December or even after the holidays to schedule his surveillance colonoscopy.    Jeremian Whitby, Vita Barley PA-C 06/09/2015, 9:12 PM  CC: [name], MD

## 2015-06-10 NOTE — Progress Notes (Signed)
Reviewed and agree with management plan. Please schedule MBSS as well.  Pricilla Riffle. Fuller Plan, MD [facility]

## 2015-06-12 ENCOUNTER — Ambulatory Visit (HOSPITAL_COMMUNITY)
Admission: RE | Admit: 2015-06-12 | Discharge: 2015-06-12 | Disposition: A | Discharge status: Home / Self Care | Payer: Medicare Other | Source: Ambulatory Visit | Attending: Physician Assistant | Admitting: Physician Assistant

## 2015-06-12 DIAGNOSIS — K224 Dyskinesia of esophagus: Secondary | ICD-10-CM | POA: Insufficient documentation

## 2015-06-12 DIAGNOSIS — R1310 Dysphagia, unspecified: Secondary | ICD-10-CM | POA: Insufficient documentation

## 2015-06-17 ENCOUNTER — Other Ambulatory Visit: Payer: Self-pay | Admitting: *Deleted

## 2015-06-17 ENCOUNTER — Other Ambulatory Visit (HOSPITAL_COMMUNITY): Payer: Self-pay | Admitting: Physician Assistant

## 2015-06-17 DIAGNOSIS — R1314 Dysphagia, pharyngoesophageal phase: Secondary | ICD-10-CM

## 2015-06-18 ENCOUNTER — Ambulatory Visit (INDEPENDENT_AMBULATORY_CARE_PROVIDER_SITE_OTHER): Payer: Medicare Other | Admitting: Neurology

## 2015-06-18 ENCOUNTER — Encounter: Payer: Self-pay | Admitting: Neurology

## 2015-06-18 VITALS — BP 132/70 | HR 78 | Resp 18 | Ht 71.0 in | Wt 192.0 lb

## 2015-06-18 DIAGNOSIS — G609 Hereditary and idiopathic neuropathy, unspecified: Secondary | ICD-10-CM | POA: Diagnosis not present

## 2015-06-18 DIAGNOSIS — R42 Dizziness and giddiness: Secondary | ICD-10-CM

## 2015-06-18 DIAGNOSIS — Z9989 Dependence on other enabling machines and devices: Secondary | ICD-10-CM

## 2015-06-18 DIAGNOSIS — G4733 Obstructive sleep apnea (adult) (pediatric): Secondary | ICD-10-CM

## 2015-06-18 NOTE — Progress Notes (Signed)
Subjective:    Patient ID: Andrew Hayes is a 74 y.o. male.  HPI     Interim history:   [name] is a 74 year old right-handed gentleman with an underlying medical history of prostate cancer diagnosed in 1996, hyperlipidemia, colonic polyps, diverticulosis, plantar fasciitis, vertigo and dizziness, who presents for a new problem and is referred for concern for neuropathy. I last saw him recently on 05/23/2015 for a new problem of possible hydrocephalus. The patient is unaccompanied today. At the last visit he reported 2 recent episodes of vertigo. He has a long-standing history of vertigo of over 30 years duration. He reported staying active and drinking enough water. He was drinking one beer per day and reported smoking cessation and about 30 years prior. He was a heavy smoker in the past. We talked about his brain MRI with and without contrast from 05/02/2015: No imaging explanation for vestibular cochlear symptoms as above. Ventriculomegaly. Are there any symptoms of normal pressure hydrocephalus? In addition, I personally reviewed the images through the PACS system and I shared images with him on my computer. He was referred by his ENT doctor, Dr. Janace Hoard who follows him for his Mnire's disease and his vertigo. The patient reported that his memory was not as good. He reported that his older brother died at the age of 63 and was diagnosed with NPH and had a shunt placed. His brother had memory loss but also did not take care of himself very well per patient. The patient was compliant with CPAP treatment felt well with it.   Today, 06/18/2015: He is referred by his primary care physician for possible neuropathy. The patient reports tingling sensation in his feet. I reviewed Dr. Eddie Dibbles office note from 06/06/2015. Laboratory test results were reviewed from 06/06/2015: CBC with differential was unremarkable with the exception of MCV of 96.4, CMP was unremarkable with the exception of total  bilirubin of 1.7, vitamin B12 was 259, TSH normal at 1.53. He describes an abnormal sensation at the bottom of his feet. It feels spongy to him. He does not have any frank pain. Symptoms have been ongoing for 3-4 months. He does not have actual numbness but it is a difference in feeling on the bottom of his feet. He has not fallen recently other than slipping on slick stones while fishing. He bruised himself on the right upper arm. He has not noticed any weakness and no symptoms in his hands. He is familiar with the term neuropathy as his wife has diabetes and is checked for neuropathy regularly.he continues to drink one beer a day. He has never been a heavy drinker. He has never been told he has diabetes. He does not know his latest A1c. He had a swallow study because of difficulty swallowing. He has an appointment for additional testing in a week from now and a follow-up in October with GI.  Previously:   I saw him on 12/25/2014 for follow-up of his OSA. He was compliant with CPAP therapy at the time.   I reviewed his CPAP compliance data from 04/22/2015 through 05/21/2015 which is a total of 30 days during which time he used his machine 22 days with percent used days greater than 4 hours at 70%, indicating adequate compliance with an average usage of 4 hours and 20 minutes. Residual AHI borderline at 5.8 per hour, leaked low with the 95th percentile at 16.9 L/m on a pressure of 8 cm.   I saw him on 09/19/2014, at which  time that he reported trying to be compliant with treatment. He did not do well with nasal pillows. He had trouble tolerating a chinstrap. He had issues with air leaking from the mask. Overall, however, he felt that he was sleeping better since treatment was started. He denied any significant restless leg symptoms. His balance had improved. For better tolerance of reduce his pressure to 8 cm from 9 cm.   I reviewed his CPAP compliance data from 11/24/2014 through 12/23/2014 which is a  total of 30 days during which time he used his machine every night with percent used days greater than 4 hours at 97% indicating excellent compliance with an average usage of 5 hours and 38 minutes, setting at 8 cm with EPR of 1. Residual AHI has increased to 7 per hour but mostly because of central apneas. When we look back at his CPAP titration study results from September 2015, he did have central apneas throughout the study on CPAP then but none with any desaturations. His leak fluctuates in the 95th percentile is at 22.2 L/m at this time. He's using a chinstrap every night. He still does not like it. He worries about his memory but has not had any recent decline. He has a family history of dementia and his older brother. He worries if he should be on a statin. He is on low-dose atorvastatin, 10 mg. He has primarily high triglycerides. He has an appointment with his primary care physician coming up for his yearly checkup. He has seen ENT for vertigo. He has had no recent issues. He does have hearing loss bilaterally.   I first met him on 02/14/2014 at the request of his primary care physician, at which time the patient reported non-restorative sleep, snoring, daytime somnolence and nighttime awakenings, essentially since 1996 (after his prostate cancer surgery). I suggested he return for sleep study. He had a baseline sleep study on 04/18/2014 followed by a CPAP titration study on 06/18/2014 and went over his test results with him in detail today. His baseline sleep study showed a reduced sleep efficiency at 72.8% with a latency to sleep normal at 11 minutes and wake after sleep onset elevated at 92 minutes with mild sleep fragmentation noted. The arousal index was mildly elevated. He had an increased percentage of stage II sleep, 3.4% of slow-wave sleep, and REM sleep at 19.3% with a normal REM latency of 96.5 minutes. He had no significant PLMS or EKG changes. He had no significant EEG changes. He had mild  intermittent snoring. Total AHI was 8.4 per hour, rising to 12.3 per hour during REM sleep and 15.3 per hour in the supine position. Average oxygen saturation was 95%, nadir was 81% in REM sleep. Based on his sleep related complaints I advised him to return for another sleep study for CPAP titration. His sleep efficiency during the second study was 72.8%. Latency to sleep was normal at 8 minutes and wake after sleep onset was elevated at 91.5 minutes with moderate sleep fragmentation noted. He had a mildly elevated arousal index. He had a normal percentage of light stage sleep, an increased percentage of deep sleep at 29.4% and a mildly reduced percentage of REM sleep at 16.5% with a normal REM latency. He had mild PLMS at 8.5 per hour with an associated arousal index of 4.5 per hour. He had no significant EKG or EEG changes. He was titrated on CPAP from 5-13 cm. AHI was 1.8 per hour at the pressure of 9. Supine  REM sleep was achieved. He did indicate sleeping better with CPAP during that night.  I reviewed his CPAP compliance data from 08/14/2014 through 09/12/2014 which is a total of 30 days during which time he used his machine 26 days. Percent used days greater than 4 hours was 83%, indicating fairly good compliance. Average usage was 4 hours and 31 minutes. Pressure at 9 cm. Leak was at times high with the 95th percentile at 27.5 L/m. Average AHI was suboptimal at 7.5 per hour. Looking at the breakdown of his AHI it appears that his central apnea index was elevated at 4.2 per hour.   He had seen Dr. Maxwell Caul for OSA before and had a home sleep test on 10/25/2013 which indicated no significant sleep disorder breathing with an AHI estimated at 3 per hour. Time below 89% saturation was 2 minutes. He reports a bedtime of 11:30 to 11:45 PM, and goes to sleep in minutes, with occasional OTC sleep aid, but not on a night to night basis. He wakes up 1-2 times per night, and sometimes his mind races and he feels it  helps to turn his radio on for distraction. He goes to the bathroom 0-1 times per night. His wife's sleep used to disturb him, because of her snoring and restlessness, but they have been sleeping in separate bedrooms for years. He does not wake up with headaches. He snores, but did not used to snore. He has been able to lose weight and plays golf 2 times a week, goes to the gym 2 times per week. He likes fly fishing. He does not wake up rested. His rise time is between 5:30 to 6 AM. He rarely dreams. He is sleepy during the day, but does not take a nap on a scheduled basis. If he lies down in the afternoon, he will go to sleep. His ESS is 14/24 today. He has fallen asleep at the wheel x 2, both of which happened after a long day of fishing on a longer drive home. He has not had a MVA and no dozing off at the wheel in the last 3 years.   There is no report of nighttime reflux, with no nighttime cough experienced. The patient has not noted any RLS symptoms and is not known to kick while asleep or before falling asleep. There is no family history of RLS or OSA. Father was a heavy snorer, mother had some sleep disruption.    He is not a very restless sleeper.    He denies cataplexy, sleep paralysis, hypnagogic or hypnopompic hallucinations, or sleep attacks. He does not report any vivid dreams, nightmares, dream enactments, or parasomnias, such as sleep talking or sleep walking. The patient has not had an attended sleep study.   He consumes 2 to 3 caffeinated beverages per day, usually in the form of coffee in the morning and ice tea with meals some time.   His bedroom is usually dark and cool. There is no TV in the bedroom and he uses a radio, with low volume.   He quit smoking in '86 and drinks 1 beer per day.    His Past Medical History Is Significant For: Past Medical History  Diagnosis Date  . Organic impotence   . Lipidemia     Mixed  . Mild sleep apnea   . Diverticulosis     Mild  . Elevated  fasting glucose     Mildly  . Plantar fasciitis   . Junctional nevus   .  Actinic keratoses   . Herniated disc   . Prostate cancer     His Past Surgical History Is Significant For: Past Surgical History  Procedure Laterality Date  . Colonoscopy    . Tonsillectomy    . Appendectomy    . Prostatectomy  1986  . Knee arthroscopy  2013    knee and elbow  . Elbow surgery  2013    His Family History Is Significant For: Family History  Problem Relation Age of Onset  . Stroke Mother   . Hypertension Mother   . Parkinson's disease Mother   . COPD Father   . Dementia Brother   . Hydrocephalus Brother   . Parkinson's disease Brother   . Parkinson's disease Sister     His Social History Is Significant For: Social History   Social History  . Marital Status: Married    Spouse Name: Hassan Rowan  . Number of Children: 2  . Years of Education: 16   Occupational History  . cpa     retired   Social History Main Topics  . Smoking status: Former Smoker -- 2.00 packs/day for 24 years    Types: Cigarettes    Quit date: 10/11/1984  . Smokeless tobacco: Former Systems developer  . Alcohol Use: 4.2 oz/week    7 Standard drinks or equivalent per week     Comment: 7 beers weekly  . Drug Use: No  . Sexual Activity: Not Asked   Other Topics Concern  . None   Social History Narrative   Patient lives at home with family.   Patient consumes 2-3 cups of caffeine daily,right handed.   2 children (1 deceased)    His Allergies Are:  Allergies  Allergen Reactions  . Percocet [Oxycodone-Acetaminophen] Other (See Comments)    Patient states he is unable to tolerate  :   His Current Medications Are:  Outpatient Encounter Prescriptions as of 06/18/2015  Medication Sig  . aspirin 81 MG tablet Take 81 mg by mouth daily.  Marland Kitchen atorvastatin (LIPITOR) 10 MG tablet Take 1 tablet by mouth daily.  Marland Kitchen co-enzyme Q-10 30 MG capsule Take 30 mg by mouth 3 (three) times daily.  . Multiple Vitamin (MULTI-VITAMIN PO)  Take by mouth daily.  . Omega-3 Fatty Acids (FISH OIL PO) Take by mouth daily.   No facility-administered encounter medications on file as of 06/18/2015.  :  Review of Systems:  Out of a complete 14 point review of systems, all are reviewed and negative with the exception of these symptoms as listed below:   Review of Systems  Neurological: Positive for numbness.       Numbness sensation on bottom of feet going on for about 3 months.     Objective:  Neurologic Exam  Physical Exam Physical Examination:   Filed Vitals:   06/18/15 1446  BP: 132/70  Pulse: 78  Resp: 18    General Examination: The patient is a very pleasant 74 y.o. male in no acute distress. He appears well-developed and well-nourished and well groomed. He is slightly anxious noted again today.   HEENT: Normocephalic, atraumatic, pupils are equal, round and reactive to light and accommodation. Funduscopic exam is normal with sharp disc margins noted. Extraocular tracking is good without limitation to gaze excursion or nystagmus noted. Normal smooth pursuit is noted. Hearing is impaired. Face is symmetric with normal facial animation and normal facial sensation. Speech is clear with no dysarthria noted. There is no hypophonia. There is no lip, neck/head, jaw or  voice tremor. Neck is supple with full range of passive and active motion. There are no carotid bruits on auscultation. Oropharynx exam reveals: mild mouth dryness, adequate dental hygiene and moderate airway crowding, due to longer tongue and redundant soft palate and longer uvula. Mallampati is class II. Tongue protrudes centrally and palate elevates symmetrically. Tonsils are absent.    Chest: Clear to auscultation without wheezing, rhonchi or crackles noted.  Heart: S1+S2+0, regular and normal without clear murmurs, rubs or gallops noted, but I may have heard an intermittent slight systolic murmur.   Abdomen: Soft, non-tender and non-distended with normal bowel  sounds appreciated on auscultation.  Extremities: There is no pitting edema in the distal lower extremities bilaterally. Pedal pulses are intact.  Skin: Warm and dry without trophic changes noted. There are no varicose veins.  Musculoskeletal: exam reveals no obvious joint deformities, tenderness or joint swelling or erythema.   Neurologically:  Mental status: The patient is awake, alert and oriented in all 4 spheres. His immediate and remote memory, attention, language skills and fund of knowledge are appropriate. There is no evidence of aphasia, agnosia, apraxia or anomia. Speech is clear with normal prosody and enunciation. Thought process is linear. Mood is normal and affect is normal.  Cranial nerves II - XII are as described above under HEENT exam. In addition: shoulder shrug is normal with equal shoulder height noted. Motor exam: Normal bulk, strength and tone is noted. There is no drift, tremor or rebound. Romberg is negative, except for slight sway. Reflexes are 2+ throughout, 1+ in the ankles. Babinski: Toes are flexor bilaterally. Fine motor skills and coordination: intact with normal finger taps, normal hand movements, normal rapid alternating patting, normal foot taps and normal foot agility.  Cerebellar testing: No dysmetria or intention tremor on finger to nose testing. Heel to shin is unremarkable bilaterally. There is no truncal or gait ataxia.  Sensory exam: intact to light touch, pinprick, vibration, temperature sense in the upper and lower extremities, but he may have slight decrease in temperature sense in the bottom of his right foot and seems to be more sensitive to pinprick sensation in his toes, right more than left.  Gait, station and balance: He stands easily. No veering to one side is noted. No leaning to one side is noted. Posture is age-appropriate and stance is narrow based. Gait shows normal stride length and normal pace. No problems turning are noted. He turns en bloc.  Tandem walk is slightly difficult, unchanged. He has normal toe stance and heel stance.  Assessment and Plan:   In summary, Andrew Hayes is a very pleasant 74 year old male with an underlying medical history of prostate cancer diagnosed in 1996, hyperlipidemia, colonic polyps, diverticulosis, plantar fasciitis, vertigo, dizziness, and obstructive sleep apnea on CPAP therapy with good compliance, who presents for a new problem and is referred by his PCP for possible neuropathy. He has had abnormal symptoms in his feet for the past 3-4 months. On examination he does not have any telltale findings but slight hypersensitivity to pinprick sensation in mild decrease in temperature sense in the right foot. We talked about his symptoms and I explained that he may have very slight are beginning symptoms of neuropathy. He had appropriate blood work recently. I would like to make sure his hemoglobin A1c is in the normal range. Sometimes even prediabetes can cause nerve damage. He is also advised to make sure that his B12 level stays in the normal range as it  is currently on the lower end of the spectrum. He has an appointment with his primary care physician today and I would like for him to have a listen to his heart again is a may have heard an intermittent slight murmur. We talked about his previous brain MRI which showed ventriculomegaly. I explained white matter changes to him as well in the recent past. His symptoms and history does not suggest normal pressure hydrocephalus. Nevertheless, we will monitor his memory and his symptoms and at this juncture I would like to proceed with an EMG and nerve conduction tests. We will call him with his test results. He can keep his follow-up with me as previously scheduled. He is encouraged to continue using CPAP regularly and he reports ongoing good results and full compliance. He had a swallow study and has another test pending for next week and a follow-up next month.   As far as his memory complaints, we will continue to monitor. He does endorse that he has been stable in that regard. He worries a lot about dementia because of his brother's history. For his hypertriglyceridemia, he is on low-dose Lipitor generic.  I talked to the patient about keeping a healthy lifestyle in general. He is encouraged to continue to exercise regularly, keep well hydrated and eat nutritious food.  I answered all his questions today and the patient was in agreement. I encouraged him to email me or call in the interim for questions or concerns. I spent 25 minutes in total face-to-face time with the patient, more than 50% of which was spent in counseling and coordination of care, reviewing test results, reviewing medication and discussing or reviewing the diagnosis of PN, ventriculomegaly, memory loss, OSA, the prognosis and treatment options.

## 2015-06-18 NOTE — Patient Instructions (Addendum)
Please have Dr. Rex Kras monitor your blood sugar level and your B12 level and check your A1c next time you have blood testing.  I would like to investigate things further to look for evidence of neuropathy or nerve damage; therefore, I would like to do an electrical testing of your muscles and nerves, which is known as EMG/NCV. Neuropathy or nerve disease or damage can be caused by a variety of causes, most commonly diabetes, some toxins including alcohol or metabolic derangements or hereditary disorders.   We will call you with the results. We will monitor your symptoms.   I would like for Dr. Rex Kras to listen to your heart again. I thought I heard a slight murmur today.

## 2015-06-27 ENCOUNTER — Ambulatory Visit (HOSPITAL_COMMUNITY)
Admission: RE | Admit: 2015-06-27 | Discharge: 2015-06-27 | Disposition: A | Discharge status: Home / Self Care | Payer: Medicare Other | Source: Ambulatory Visit | Attending: Physician Assistant | Admitting: Physician Assistant

## 2015-06-27 DIAGNOSIS — R1314 Dysphagia, pharyngoesophageal phase: Secondary | ICD-10-CM

## 2015-06-27 DIAGNOSIS — R1310 Dysphagia, unspecified: Secondary | ICD-10-CM | POA: Diagnosis not present

## 2015-06-27 NOTE — Procedures (Signed)
Objective Swallowing Evaluation: Other (Comment)  Patient Details  Name: Andrew Hayes MRN: [id number] Date of Birth: 05-Jan-1941  Today's Date: 06/27/2015 Time: SLP Start Time (ACUTE ONLY): 1305-SLP Stop Time (ACUTE ONLY): 1340 SLP Time Calculation (min) (ACUTE ONLY): 35 min  Past Medical History:  Past Medical History  Diagnosis Date  . Organic impotence   . Lipidemia     Mixed  . Mild sleep apnea   . Diverticulosis     Mild  . Elevated fasting glucose     Mildly  . Plantar fasciitis   . Junctional nevus   . Actinic keratoses   . Herniated disc   . Prostate cancer    Past Surgical History:  Past Surgical History  Procedure Laterality Date  . Colonoscopy    . Tonsillectomy    . Appendectomy    . Prostatectomy  1986  . Knee arthroscopy  2013    knee and elbow  . Elbow surgery  2013   HPI:  Other Pertinent Information: 74 yo male referred for MBS by MD. Pt has h/o prostate cancer diagnosed in 1996, HLD, colonic polyps, diverticulosis, planter fascitis, vertigo and dizziness.  Pt was seen for possible hydrocephalus - s/p MRI that showed ventriculomegaly.  Pt reports his brother died at 34 from NPH and dementia.  Pt also reports issues with feet feeling "spongy" for several months without pain.  He reports to be scheduled for testing of muscle contractions in leg next week.  Pt denies weight loss, pulmonary infections nor requiring heimlich maneuver.  He does admit to dysphagia = with sensation of food lodging in throat requiring liquids to clear.    No Data Recorded  Assessment / Plan / Recommendation CHL IP CLINICAL IMPRESSIONS 06/27/2015  Therapy Diagnosis Mild pharyngeal phase dysphagia  Clinical Impression Mild pharyngeal motor based dysphagia characterized by decreased tongue base retraction and epiglottic deflection resulting in mild pharyngeal residuals.  Pt largely sensed these and conducted dry swallows to clear.  Residuals noted across consistencies.  Barium  tablet give with thin readily transited through pharynx and appeared to clear esophagus without delay.  Advised pt to start meals with liquids, follow solids with liquids and conduct dry swallows as needed.  Side note:  Also noted pt to have excessive foot movement throughout testing without awareness *when sitting in chair, ? source.  Thanks for this consult.       CHL IP TREATMENT RECOMMENDATION 06/27/2015  Treatment Recommendations No treatment recommended at this time     CHL IP DIET RECOMMENDATION 06/27/2015  SLP Diet Recommendations Age appropriate regular solids;Thin  Liquid Administration via cup  Medication Administration Whole meds with liquid  Compensations Follow solids with liquid;Other (Comment);Small sips/bites;Slow rate  Postural Changes and/or Swallow Maneuvers Start meals with drinks, stay upright after meals     CHL IP OTHER RECOMMENDATIONS 06/27/2015  Recommended Consults Other (Comment)  Oral Care Recommendations Oral care BID  Other Recommendations (None)          CHL IP REASON FOR REFERRAL 06/27/2015  Reason for Referral Objectively evaluate swallowing function     CHL IP ORAL PHASE 06/27/2015  Oral Phase WFL      CHL IP PHARYNGEAL PHASE 06/27/2015  Pharyngeal Phase Impaired  Pharyngeal Comment pt sensed mild residuals and conducts dry swallows to faciliate clearance      CHL IP CERVICAL ESOPHAGEAL PHASE 06/27/2015  Cervical Esophageal Phase WFL    CHL IP GO 06/27/2015  Functional Assessment Tool Used MBS, clinical judgement  Functional Limitations Swallowing  Swallow Current Status (O1188) CI  Swallow Goal Status (Q7737) CI  Swallow Discharge Status [id number]) [place], [place] [facility] [place] [phone]

## 2015-07-15 ENCOUNTER — Ambulatory Visit (INDEPENDENT_AMBULATORY_CARE_PROVIDER_SITE_OTHER): Payer: Medicare Other | Admitting: Neurology

## 2015-07-15 ENCOUNTER — Telehealth: Payer: Self-pay

## 2015-07-15 ENCOUNTER — Ambulatory Visit (INDEPENDENT_AMBULATORY_CARE_PROVIDER_SITE_OTHER): Payer: Self-pay | Admitting: Neurology

## 2015-07-15 DIAGNOSIS — G609 Hereditary and idiopathic neuropathy, unspecified: Secondary | ICD-10-CM | POA: Diagnosis not present

## 2015-07-15 DIAGNOSIS — Z0289 Encounter for other administrative examinations: Secondary | ICD-10-CM

## 2015-07-15 DIAGNOSIS — R202 Paresthesia of skin: Secondary | ICD-10-CM

## 2015-07-15 NOTE — Progress Notes (Signed)
Quick Note:  Please call and advise the patient that the recent EMG and nerve conduction velocity test, which is the electrical nerve and muscle test we we performed, was reported as within normal limits. We checked for abnormal electrical discharges in the muscles or nerves and the report suggested normal findings, please remind him, however, that a so called small fiber neuropathy, cannot be picked up by this test (the technology is not there yet), but overall very reassuring findings. No further action is required on this test at this time. Please remind patient to keep any upcoming appointments or tests and to call us with any interim questions, concerns, problems or updates. Thanks,  Star Age, MD, PhD   ______

## 2015-07-15 NOTE — Procedures (Signed)
   NCS (NERVE CONDUCTION STUDY) WITH EMG (ELECTROMYOGRAPHY) REPORT   STUDY DATE: July 15 2015 PATIENT NAME: Sheppard Luckenbach DOB: 15-Dec-1940 MRN: [id number]    TECHNOLOGIST: Laretta Alstrom ELECTROMYOGRAPHER: Marcial Pacas M.D.  CLINICAL INFORMATION: 74 years old male, with few months history of bilateral feet discomfort, feel like a cushion at the ball of his feet, worsening with weightbearing  FINDINGS: NERVE CONDUCTION STUDY: Bilateral peroneal sensory responses were normal. Bilateral peroneal to EDB, tibial motor responses were normal. The lateral tibial H reflexes were normal and symmetric.  NEEDLE ELECTROMYOGRAPHY: Selected needle examination was performed at left lower extremity muscles, left lumbosacral paraspinal muscles.  Needle examination of left tibialis anterior, tibialis posterior, vastus lateralis, medial gastrocnemius, biceps femoris short head was normal  There was no spontaneous activities at left lumbosacral paraspinal muscles, at left L4-5 S1.  IMPRESSION:  This is a normal study. There was no electrodiagnostic evidence of large fiber peripheral neuropathy or left lumbosacral radiculopathy. But today's test could not rule out the possibility of small fiber neuropathy, if clinically indicated, may consider skin biopsy.   INTERPRETING PHYSICIAN:   Marcial Pacas M.D. Ph.D. [facility] [address], [place], [place] [address] [phone]

## 2015-07-15 NOTE — Progress Notes (Signed)
Today's electrodiagnostic study is normal, there is no evidence of large fiber peripheral neuropathy or left lumbar radiculopathy.

## 2015-07-15 NOTE — Telephone Encounter (Signed)
-----   Message from Star Age, MD sent at 07/15/2015 10:15 AM EDT ----- Please call and advise the patient that the recent EMG and nerve conduction velocity test, which is the electrical nerve and muscle test we we performed, was reported as within normal limits. We checked for abnormal electrical discharges in the muscles or nerves and the report suggested normal findings, please remind him, however, that a so called small fiber neuropathy, cannot be picked up by this test (the technology is not there yet), but overall very reassuring findings. No further action is required on this test at this time. Please remind patient to keep any upcoming appointments or tests and to call us with any interim questions, concerns, problems or updates. Thanks,  Star Age, MD, PhD

## 2015-07-15 NOTE — Telephone Encounter (Signed)
I spoke to patient and he is aware of results and voices understanding.  

## 2015-07-22 ENCOUNTER — Ambulatory Visit (AMBULATORY_SURGERY_CENTER): Payer: Medicare Other | Admitting: Gastroenterology

## 2015-07-22 ENCOUNTER — Encounter: Payer: Self-pay | Admitting: Gastroenterology

## 2015-07-22 VITALS — BP 135/94 | HR 58 | Temp 96.7°F | Resp 22 | Ht 71.0 in | Wt 195.0 lb

## 2015-07-22 DIAGNOSIS — R1310 Dysphagia, unspecified: Secondary | ICD-10-CM

## 2015-07-22 MED ORDER — SODIUM CHLORIDE 0.9 % IV SOLN: 500.0000 mL | INTRAVENOUS | Status: DC

## 2015-07-22 NOTE — Progress Notes (Signed)
Report to PACU, RN, vss, BBS= Clear.  

## 2015-07-22 NOTE — Patient Instructions (Signed)
YOU HAD AN ENDOSCOPIC PROCEDURE TODAY AT [facility]:   Refer to the procedure report that was given to you for any specific questions about what was found during the examination.  If the procedure report does not answer your questions, please call your gastroenterologist to clarify.  If you requested that your care partner not be given the details of your procedure findings, then the procedure report has been included in a sealed envelope for you to review at your convenience later.  YOU SHOULD EXPECT: Some feelings of bloating in the abdomen. Passage of more gas than usual.  Walking can help get rid of the air that was put into your GI tract during the procedure and reduce the bloating. If you had a lower endoscopy (such as a colonoscopy or flexible sigmoidoscopy) you may notice spotting of blood in your stool or on the toilet paper. If you underwent a bowel prep for your procedure, you may not have a normal bowel movement for a few days.  Please Note:  You might notice some irritation and congestion in your nose or some drainage.  This is from the oxygen used during your procedure.  There is no need for concern and it should clear up in a day or so.  SYMPTOMS TO REPORT IMMEDIATELY:    Following upper endoscopy (EGD)  Vomiting of blood or coffee ground material  New chest pain or pain under the shoulder blades  Painful or persistently difficult swallowing  New shortness of breath  Fever of 100F or higher  Black, tarry-looking stools  For urgent or emergent issues, a gastroenterologist can be reached at any hour by calling [phone].   DIET: Dilation Diet  ACTIVITY:  You should plan to take it easy for the rest of today and you should NOT DRIVE or use heavy machinery until tomorrow (because of the sedation medicines used during the test).    FOLLOW UP: Our staff will call the number listed on your records the next business day following your procedure to check on you  and address any questions or concerns that you may have regarding the information given to you following your procedure. If we do not reach you, we will leave a message.  However, if you are feeling well and you are not experiencing any problems, there is no need to return our call.  We will assume that you have returned to your regular daily activities without incident.  If any biopsies were taken you will be contacted by phone or by letter within the next 1-3 weeks.  Please call us at [phone] if you have not heard about the biopsies in 3 weeks.    SIGNATURES/CONFIDENTIALITY: You and/or your care partner have signed paperwork which will be entered into your electronic medical record.  These signatures attest to the fact that that the information above on your After Visit Summary has been reviewed and is understood.  Full responsibility of the confidentiality of this discharge information lies with you and/or your care-partner.

## 2015-07-22 NOTE — Op Note (Signed)
[facility]  [address] [place], [id number]   ENDOSCOPY PROCEDURE REPORT  PATIENT: Andrew Hayes, Andrew Hayes  MR#: [id number] BIRTHDATE: 1940-11-06 , 74  yrs. old GENDER: male ENDOSCOPIST: Ladene Artist, MD, [facility] REFERRED BY:  [name], M.D. PROCEDURE DATE:  07/22/2015 PROCEDURE:  EGD w/ wire guided (savary) dilation ASA CLASS:     Class II INDICATIONS:  dysphagia. MEDICATIONS: Monitored anesthesia care and Propofol 140 mg IV TOPICAL ANESTHETIC: none DESCRIPTION OF PROCEDURE: After the risks benefits and alternatives of the procedure were thoroughly explained, informed consent was obtained.  The LB [id number] endoscope was introduced through the mouth and advanced to the second portion of the duodenum , Without limitations.  The instrument was slowly withdrawn as the mucosa was fully examined.    ESOPHAGUS: The mucosa of the esophagus appeared normal.  The esophagus was dilated using a 36mm (48Fr) savary dilator over guidewire for dysphagia without a stricture. STOMACH: The mucosa of the stomach appeared normal. DUODENUM: The duodenal mucosa showed no abnormalities.  Retroflexed views revealed no abnormalities.     The scope was then withdrawn from the patient and the procedure completed.  COMPLICATIONS: There were no immediate complications.  ENDOSCOPIC IMPRESSION: 1.   The EGD appeared normal; esophagus dilated using a 30mm savary dilator over guidewire  RECOMMENDATIONS: 1.  Post dilation instructions 2.  See MBSS report showing mild pharyngeal dysphagia 3.  Follow-up appointment with primary MD for ongoing medical care   eSigned:  Ladene Artist, MD, [facility] 07/22/2015 1:57 PM

## 2015-07-22 NOTE — Progress Notes (Signed)
Called to room to assist during endoscopic procedure.  Patient ID and intended procedure confirmed with present staff. Received instructions for my participation in the procedure from the performing physician.  

## 2015-07-23 ENCOUNTER — Telehealth: Payer: Self-pay

## 2015-07-23 NOTE — Telephone Encounter (Signed)
  Follow up Call-  Call back number 07/22/2015  Post procedure Call Back phone  # [phone]  Permission to leave phone message Yes     Patient questions:  Do you have a fever, pain , or abdominal swelling? No. Pain Score  0 *  Have you tolerated food without any problems? Yes.    Have you been able to return to your normal activities? Yes.    Do you have any questions about your discharge instructions: Diet   No. Medications  No. Follow up visit  No.  Do you have questions or concerns about your Care? No.  Actions: * If pain score is 4 or above: No action needed, pain <4.  No problems per the pt.  He thanked [place] for the care he received. maw

## 2015-10-02 ENCOUNTER — Encounter: Payer: Self-pay | Admitting: Gastroenterology

## 2015-11-05 ENCOUNTER — Encounter: Payer: Self-pay | Admitting: Gastroenterology

## 2015-11-24 ENCOUNTER — Ambulatory Visit: Payer: Medicare Other | Admitting: Neurology

## 2015-12-23 ENCOUNTER — Ambulatory Visit (INDEPENDENT_AMBULATORY_CARE_PROVIDER_SITE_OTHER): Payer: Medicare Other | Admitting: Neurology

## 2015-12-23 ENCOUNTER — Encounter: Payer: Self-pay | Admitting: Neurology

## 2015-12-23 VITALS — BP 160/78 | HR 70 | Resp 18 | Ht 71.0 in | Wt 190.0 lb

## 2015-12-23 DIAGNOSIS — R419 Unspecified symptoms and signs involving cognitive functions and awareness: Secondary | ICD-10-CM | POA: Diagnosis not present

## 2015-12-23 DIAGNOSIS — G4733 Obstructive sleep apnea (adult) (pediatric): Secondary | ICD-10-CM

## 2015-12-23 DIAGNOSIS — Z9989 Dependence on other enabling machines and devices: Principal | ICD-10-CM

## 2015-12-23 DIAGNOSIS — R202 Paresthesia of skin: Secondary | ICD-10-CM | POA: Diagnosis not present

## 2015-12-23 NOTE — Patient Instructions (Signed)

## 2015-12-23 NOTE — Progress Notes (Signed)
Subjective:    Patient ID: Andrew Hayes is a 75 y.o. male.  HPI     Interim history:   Andrew Hayes is a 75 year old right-handed gentleman with an underlying medical history of prostate cancer diagnosed in 1996, hyperlipidemia, colonic polyps, diverticulosis, plantar fasciitis, vertigo and dizziness, who presents for follow-up consultation of his OSA, on CPAP therapy and paresthesias. The patient is unaccompanied today. I last saw him on 06/18/2015 at which time he was referred by his primary care physician for problems with paresthesias and concern for neuropathy. He reported tingling sensation in both feet. I reviewed Dr. Eddie Dibbles office note from 06/06/2015. Laboratory test results were reviewed from 06/06/2015: CBC with differential was unremarkable with the exception of MCV of 96.4, CMP was unremarkable with the exception of total bilirubin of 1.7, vitamin B12 was 259, TSH normal at 1.53. He described an abnormal sensation at the bottom of his feet but no pain. This was ongoing for 3-4 months at the time. He denied any actual numbness. He had noted no weakness. He had never been told that he had diabetes and was never a heavy alcohol consumer, drinking about 1 beer/day. I suggested we proceed with an EMG and nerve conduction tests. He had this on 07/15/2015: IMPRESSION:   This is a normal study. There was no electrodiagnostic evidence of large fiber peripheral neuropathy or left lumbosacral radiculopathy. But today's test could not rule out the possibility of small fiber neuropathy, if clinically indicated, may consider skin biopsy.  We called him with his test results.  Today, 12/23/2015: I reviewed his CPAP compliance data from 11/22/2015 through 12/21/2015 which is a total of 30 days during which time he used his machine every night with percent used days greater than 4 hours at 100%, indicating superb compliance with an average usage of 6 hours and 19 minutes, residual AHI 3.4 per  hour, leak acceptable with the 95th percentile at 17.4 L/m on a pressure of 8 cm.  Today, 12/23/2015: He reports doing well, no new concerns. He still worries about his FHx of PD, NPH, dementia, etc. He has lost a little bit of weight, exercises regularly, likes to go trout fishing, always wears wading boots, and sometimes uses a [employer], has not fallen, is compliant with CPAP, feels better rested, cognitively feels the same, paresthesias or the same, no recent falls, and thankfully no recent vertigo spells. Tries to hydrate well, always wears a large brimmed hat when fishing and golfing.   Previously:   I saw him on 05/23/2015 for a new problem of possible hydrocephalus. The patient is unaccompanied today. At the last visit he reported 2 recent episodes of vertigo. He has a long-standing history of vertigo of over 30 years duration. He reported staying active and drinking enough water. He was drinking one beer per day and reported smoking cessation and about 30 years prior. He was a heavy smoker in the past. We talked about his brain MRI with and without contrast from 05/02/2015: No imaging explanation for vestibular cochlear symptoms as above. Ventriculomegaly. Are there any symptoms of normal pressure hydrocephalus? In addition, I personally reviewed the images through the PACS system and I shared images with him on my computer. He was referred by his ENT doctor, Dr. Janace Hoard who follows him for his Mnire's disease and his vertigo. The patient reported that his memory was not as good. He reported that his older brother died at the age of 45 and was diagnosed with NPH  and had a shunt placed. His brother had memory loss but also did not take care of himself very well per patient. The patient was compliant with CPAP treatment felt well with it.   I saw him on 12/25/2014 for follow-up of his OSA. He was compliant with CPAP therapy at the time.   I reviewed his CPAP compliance data from 04/22/2015  through 05/21/2015 which is a total of 30 days during which time he used his machine 22 days with percent used days greater than 4 hours at 70%, indicating adequate compliance with an average usage of 4 hours and 20 minutes. Residual AHI borderline at 5.8 per hour, leaked low with the 95th percentile at 16.9 L/m on a pressure of 8 cm.   I saw him on 09/19/2014, at which time that he reported trying to be compliant with treatment. He did not do well with nasal pillows. He had trouble tolerating a chinstrap. He had issues with air leaking from the mask. Overall, however, he felt that he was sleeping better since treatment was started. He denied any significant restless leg symptoms. His balance had improved. For better tolerance of reduce his pressure to 8 cm from 9 cm.   I reviewed his CPAP compliance data from 11/24/2014 through 12/23/2014 which is a total of 30 days during which time he used his machine every night with percent used days greater than 4 hours at 97% indicating excellent compliance with an average usage of 5 hours and 38 minutes, setting at 8 cm with EPR of 1. Residual AHI has increased to 7 per hour but mostly because of central apneas. When we look back at his CPAP titration study results from September 2015, he did have central apneas throughout the study on CPAP then but none with any desaturations. His leak fluctuates in the 95th percentile is at 22.2 L/m at this time. He's using a chinstrap every night. He still does not like it. He worries about his memory but has not had any recent decline. He has a family history of dementia and his older brother. He worries if he should be on a statin. He is on low-dose atorvastatin, 10 mg. He has primarily high triglycerides. He has an appointment with his primary care physician coming up for his yearly checkup. He has seen ENT for vertigo. He has had no recent issues. He does have hearing loss bilaterally.   I first met him on 02/14/2014 at the  request of his primary care physician, at which time the patient reported non-restorative sleep, snoring, daytime somnolence and nighttime awakenings, essentially since 1996 (after his prostate cancer surgery). I suggested he return for sleep study. He had a baseline sleep study on 04/18/2014 followed by a CPAP titration study on 06/18/2014 and went over his test results with him in detail today. His baseline sleep study showed a reduced sleep efficiency at 72.8% with a latency to sleep normal at 11 minutes and wake after sleep onset elevated at 92 minutes with mild sleep fragmentation noted. The arousal index was mildly elevated. He had an increased percentage of stage II sleep, 3.4% of slow-wave sleep, and REM sleep at 19.3% with a normal REM latency of 96.5 minutes. He had no significant PLMS or EKG changes. He had no significant EEG changes. He had mild intermittent snoring. Total AHI was 8.4 per hour, rising to 12.3 per hour during REM sleep and 15.3 per hour in the supine position. Average oxygen saturation was 95%, nadir was 81%  in REM sleep. Based on his sleep related complaints I advised him to return for another sleep study for CPAP titration. His sleep efficiency during the second study was 72.8%. Latency to sleep was normal at 8 minutes and wake after sleep onset was elevated at 91.5 minutes with moderate sleep fragmentation noted. He had a mildly elevated arousal index. He had a normal percentage of light stage sleep, an increased percentage of deep sleep at 29.4% and a mildly reduced percentage of REM sleep at 16.5% with a normal REM latency. He had mild PLMS at 8.5 per hour with an associated arousal index of 4.5 per hour. He had no significant EKG or EEG changes. He was titrated on CPAP from 5-13 cm. AHI was 1.8 per hour at the pressure of 9. Supine REM sleep was achieved. He did indicate sleeping better with CPAP during that night.  I reviewed his CPAP compliance data from 08/14/2014 through  09/12/2014 which is a total of 30 days during which time he used his machine 26 days. Percent used days greater than 4 hours was 83%, indicating fairly good compliance. Average usage was 4 hours and 31 minutes. Pressure at 9 cm. Leak was at times high with the 95th percentile at 27.5 L/m. Average AHI was suboptimal at 7.5 per hour. Looking at the breakdown of his AHI it appears that his central apnea index was elevated at 4.2 per hour.   He had seen Dr. Maxwell Caul for OSA before and had a home sleep test on 10/25/2013 which indicated no significant sleep disorder breathing with an AHI estimated at 3 per hour. Time below 89% saturation was 2 minutes. He reports a bedtime of 11:30 to 11:45 PM, and goes to sleep in minutes, with occasional OTC sleep aid, but not on a night to night basis. He wakes up 1-2 times per night, and sometimes his mind races and he feels it helps to turn his radio on for distraction. He goes to the bathroom 0-1 times per night. His wife's sleep used to disturb him, because of her snoring and restlessness, but they have been sleeping in separate bedrooms for years. He does not wake up with headaches. He snores, but did not used to snore. He has been able to lose weight and plays golf 2 times a week, goes to the gym 2 times per week. He likes fly fishing. He does not wake up rested. His rise time is between 5:30 to 6 AM. He rarely dreams. He is sleepy during the day, but does not take a nap on a scheduled basis. If he lies down in the afternoon, he will go to sleep. His ESS is 14/24 today. He has fallen asleep at the wheel x 2, both of which happened after a long day of fishing on a longer drive home. He has not had a MVA and no dozing off at the wheel in the last 3 years.   There is no report of nighttime reflux, with no nighttime cough experienced. The patient has not noted any RLS symptoms and is not known to kick while asleep or before falling asleep. There is no family history of RLS or  OSA. Father was a heavy snorer, mother had some sleep disruption.    He is not a very restless sleeper.    He denies cataplexy, sleep paralysis, hypnagogic or hypnopompic hallucinations, or sleep attacks. He does not report any vivid dreams, nightmares, dream enactments, or parasomnias, such as sleep talking or sleep walking. The  patient has not had an attended sleep study.   He consumes 2 to 3 caffeinated beverages per day, usually in the form of coffee in the morning and ice tea with meals some time.   His bedroom is usually dark and cool. There is no TV in the bedroom and he uses a radio, with low volume.   He quit smoking in '86 and drinks 1 beer per day.    His Past Medical History Is Significant For: Past Medical History  Diagnosis Date  . Organic impotence   . Lipidemia     Mixed  . Mild sleep apnea   . Diverticulosis     Mild  . Elevated fasting glucose     Mildly  . Plantar fasciitis   . Junctional nevus   . Actinic keratoses   . Herniated disc   . Prostate cancer [facility])     His Past Surgical History Is Significant For: Past Surgical History  Procedure Laterality Date  . Colonoscopy    . Tonsillectomy    . Appendectomy    . Prostatectomy  1986  . Knee arthroscopy  2013    knee and elbow  . Elbow surgery  2013    His Family History Is Significant For: Family History  Problem Relation Age of Onset  . Stroke Mother   . Hypertension Mother   . Parkinson's disease Mother   . COPD Father   . Dementia Brother   . Hydrocephalus Brother   . Parkinson's disease Brother   . Parkinson's disease Sister     His Social History Is Significant For: Social History   Social History  . Marital Status: Married    Spouse Name: Andrew Hayes  . Number of Children: 2  . Years of Education: 16   Occupational History  . cpa     retired   Social History Main Topics  . Smoking status: Former Smoker -- 2.00 packs/day for 24 years    Types: Cigarettes    Quit date: 10/11/1984  .  Smokeless tobacco: Former Systems developer  . Alcohol Use: 4.2 oz/week    7 Standard drinks or equivalent per week     Comment: 7 beers weekly  . Drug Use: No  . Sexual Activity: Not Asked   Other Topics Concern  . None   Social History Narrative   Patient lives at home with family.   Patient consumes 2-3 cups of caffeine daily,right handed.   2 children (1 deceased)    His Allergies Are:  Allergies  Allergen Reactions  . Percocet [Oxycodone-Acetaminophen] Other (See Comments)    Patient states he is unable to tolerate  :  His Current Medications Are:  Outpatient Encounter Prescriptions as of 12/23/2015  Medication Sig  . aspirin 81 MG tablet Take 81 mg by mouth daily.  Marland Kitchen atorvastatin (LIPITOR) 10 MG tablet Take 1 tablet by mouth daily.  . cholecalciferol (VITAMIN D) 1000 units tablet Take 2,000 Units by mouth daily.  Marland Kitchen co-enzyme Q-10 30 MG capsule Take 30 mg by mouth 2 (two) times daily.   . Multiple Vitamin (MULTI-VITAMIN PO) Take by mouth daily.  . Omega-3 Fatty Acids (FISH OIL PO) Take by mouth daily.   No facility-administered encounter medications on file as of 12/23/2015.  :  Review of Systems:  Out of a complete 14 point review of systems, all are reviewed and negative with the exception of these symptoms as listed below:   Review of Systems  Neurological:  No new concerns per patient.     Objective:  Neurologic Exam  Physical Exam Physical Examination:   Filed Vitals:   12/23/15 0943  BP: 160/78  Pulse: 70  Resp: 18   General Examination: The patient is a very pleasant 75 y.o. male in no acute distress. He appears well-developed and well-nourished and well groomed. He is slightly anxious appearing again today.   HEENT: Normocephalic, atraumatic, pupils are equal, round and reactive to light and accommodation. Extraocular tracking is good without limitation to gaze excursion or nystagmus noted. Normal smooth pursuit is noted. Hearing is impaired mildly. Face is  symmetric with normal facial animation and normal facial sensation. Speech is clear with no dysarthria noted. There is no hypophonia. There is no lip, neck/head, jaw or voice tremor. Neck is supple with full range of passive and active motion. There are no carotid bruits on auscultation. Oropharynx exam reveals: mild mouth dryness, adequate dental hygiene and moderate airway crowding, due to longer tongue and redundant soft palate and longer uvula. Mallampati is class II. Tongue protrudes centrally and palate elevates symmetrically. Tonsils are absent.    Chest: Clear to auscultation without wheezing, rhonchi or crackles noted.  Heart: S1+S2+0, regular and normal without clear murmurs, rubs or gallops noted.   Abdomen: Soft, non-tender and non-distended with normal bowel sounds appreciated on auscultation.  Extremities: There is no pitting edema in the distal lower extremities bilaterally. Pedal pulses are intact.  Skin: Warm and dry without trophic changes noted. There are no varicose veins.  Musculoskeletal: exam reveals no obvious joint deformities, tenderness or joint swelling or erythema.   Neurologically:  Mental status: The patient is awake, alert and oriented in all 4 spheres. His immediate and remote memory, attention, language skills and fund of knowledge are appropriate. There is no evidence of aphasia, agnosia, apraxia or anomia. Speech is clear with normal prosody and enunciation. Thought process is linear. Mood is normal and affect is normal.  Cranial nerves II - XII are as described above under HEENT exam. In addition: shoulder shrug is normal with equal shoulder height noted. Motor exam: Normal bulk, strength and tone is noted. There is no drift, tremor or rebound. Romberg is negative, except for slight sway. Reflexes are 1-2+ throughout, 1+ in the ankles. Fine motor skills and coordination: intact with normal finger taps, normal hand movements, normal rapid alternating patting,  normal foot taps and normal foot agility.  Cerebellar testing: No dysmetria or intention tremor on finger to nose testing. Heel to shin is unremarkable bilaterally. There is no truncal or gait ataxia.  Sensory exam: intact to light touch in the upper and lower extremities.  Gait, station and balance: He stands easily. No veering to one side is noted. No leaning to one side is noted. Posture is age-appropriate and stance is narrow based. Gait shows normal stride length and normal pace. No problems turning are noted. He turns en bloc. Tandem walk is slightly difficult, unchanged.   Assessment and Plan:   In summary, Andrew Hayes is a very pleasant 75 year old male with an underlying medical history of prostate cancer diagnosed in 1996, hyperlipidemia, colonic polyps, diverticulosis, plantar fasciitis, vertigo, dizziness, and obstructive sleep apnea on CPAP therapy with great compliance, who presents for follow up consultation of his OSA, paresthesias and cognitive complaints. Findings are benign, exam has been stable, w/u benign with blood work, and EMG/NCV testing recently. He is scheduled for his annual visit with Dr. Rex Kras in May 2017 and  has seen a dermatologist recently. We again talked about his previous brain MRI which showed some ventriculomegaly. I explained white matter changes to him again today and his symptoms and history does not suggest normal pressure hydrocephalus. he has no evidence of parkinsonism on exam. I suggested we continue to monitor his symptoms of memory complaints and paresthesias. He is congratulated on his superb CPAP treatment adherence. He feels improved with respect to his sleep and stable with respect to everything else. At this juncture, I suggested a one-year checkup with me, sooner if needed.  I answered all his questions today and the patient was in agreement. I encouraged him to email me or call in the interim for questions or concerns. I spent 25 minutes in  total face-to-face time with the patient, more than 50% of which was spent in counseling and coordination of care, reviewing test results, reviewing medication and discussing or reviewing the diagnosis of PN, ventriculomegaly, memory loss, OSA, the prognosis and treatment options.

## 2016-02-19 ENCOUNTER — Encounter: Payer: Self-pay | Admitting: Gastroenterology

## 2016-03-23 ENCOUNTER — Telehealth: Payer: Self-pay | Admitting: Neurology

## 2016-03-23 NOTE — Telephone Encounter (Signed)
I spoke to the patient and he had some [place]. I gave him some advice and advised him to call [facility] to see if his power source to the machine will work in [place] and [place].

## 2016-03-23 NOTE — Telephone Encounter (Signed)
Pt called said he may have to do some traveling with CPAP and has some questions.

## 2016-04-23 ENCOUNTER — Ambulatory Visit (AMBULATORY_SURGERY_CENTER): Payer: Self-pay

## 2016-04-23 ENCOUNTER — Encounter: Payer: Self-pay | Admitting: Gastroenterology

## 2016-04-23 VITALS — Ht 71.0 in | Wt 193.8 lb

## 2016-04-23 DIAGNOSIS — Z860100 Personal history of colon polyps, unspecified: Secondary | ICD-10-CM

## 2016-04-23 MED ORDER — SUPREP BOWEL PREP KIT 17.5-3.13-1.6 GM/177ML PO SOLN: 1.0000 | Freq: Once | ORAL | Status: DC

## 2016-04-23 NOTE — Progress Notes (Signed)
No allergies to eggs or soy No past problems with anesthesia No diet meds No home oxygen  Declined emmi 

## 2016-05-03 ENCOUNTER — Ambulatory Visit (AMBULATORY_SURGERY_CENTER): Payer: Medicare Other | Admitting: Gastroenterology

## 2016-05-03 ENCOUNTER — Encounter: Payer: Self-pay | Admitting: Gastroenterology

## 2016-05-03 VITALS — BP 143/86 | HR 59 | Temp 97.7°F | Resp 16 | Ht 71.0 in | Wt 193.0 lb

## 2016-05-03 DIAGNOSIS — D123 Benign neoplasm of transverse colon: Secondary | ICD-10-CM

## 2016-05-03 DIAGNOSIS — Z86010 Personal history of colonic polyps: Secondary | ICD-10-CM | POA: Diagnosis not present

## 2016-05-03 DIAGNOSIS — K635 Polyp of colon: Secondary | ICD-10-CM

## 2016-05-03 NOTE — Patient Instructions (Signed)
YOU HAD AN ENDOSCOPIC PROCEDURE TODAY AT THE [facility]:   Refer to the procedure report that was given to you for any specific questions about what was found during the examination.  If the procedure report does not answer your questions, please call your gastroenterologist to clarify.  If you requested that your care partner not be given the details of your procedure findings, then the procedure report has been included in a sealed envelope for you to review at your convenience later.  YOU SHOULD EXPECT: Some feelings of bloating in the abdomen. Passage of more gas than usual.  Walking can help get rid of the air that was put into your GI tract during the procedure and reduce the bloating. If you had a lower endoscopy (such as a colonoscopy or flexible sigmoidoscopy) you may notice spotting of blood in your stool or on the toilet paper. If you underwent a bowel prep for your procedure, you may not have a normal bowel movement for a few days.  Please Note:  You might notice some irritation and congestion in your nose or some drainage.  This is from the oxygen used during your procedure.  There is no need for concern and it should clear up in a day or so.  SYMPTOMS TO REPORT IMMEDIATELY:   Following lower endoscopy (colonoscopy or flexible sigmoidoscopy):  Excessive amounts of blood in the stool  Significant tenderness or worsening of abdominal pains  Swelling of the abdomen that is new, acute  Fever of 100F or higher   For urgent or emergent issues, a gastroenterologist can be reached at any hour by calling [phone].   DIET: Your first meal following the procedure should be a small meal and then it is ok to progress to your normal diet. Heavy or fried foods are harder to digest and may make you feel nauseous or bloated.  Likewise, meals heavy in dairy and vegetables can increase bloating.  Drink plenty of fluids but you should avoid alcoholic beverages for 24  hours.  ACTIVITY:  You should plan to take it easy for the rest of today and you should NOT DRIVE or use heavy machinery until tomorrow (because of the sedation medicines used during the test).    FOLLOW UP: Our staff will call the number listed on your records the next business day following your procedure to check on you and address any questions or concerns that you may have regarding the information given to you following your procedure. If we do not reach you, we will leave a message.  However, if you are feeling well and you are not experiencing any problems, there is no need to return our call.  We will assume that you have returned to your regular daily activities without incident.  If any biopsies were taken you will be contacted by phone or by letter within the next 1-3 weeks.  Please call us at [phone] if you have not heard about the biopsies in 3 weeks.    SIGNATURES/CONFIDENTIALITY: You and/or your care partner have signed paperwork which will be entered into your electronic medical record.  These signatures attest to the fact that that the information above on your After Visit Summary has been reviewed and is understood.  Full responsibility of the confidentiality of this discharge information lies with you and/or your care-partner.  Read all of the handouts given to you by your recovery room nurse. 

## 2016-05-03 NOTE — Progress Notes (Signed)
Called to room to assist during endoscopic procedure.  Patient ID and intended procedure confirmed with present staff. Received instructions for my participation in the procedure from the performing physician.  

## 2016-05-03 NOTE — Progress Notes (Signed)
Report to PACU, RN, vss, BBS= Clear.  

## 2016-05-03 NOTE — Op Note (Signed)
[facility] Patient Name: Andrew Hayes Procedure Date: 05/03/2016 10:56 AM MRN: [id number] Endoscopist: Ladene Artist , MD Age: 75 Referring MD:  Date of Birth: 08-Feb-1941 Gender: Male Account #: [id number] Procedure:                Colonoscopy Indications:              Surveillance: Personal history of adenomatous                            polyps on last colonoscopy > 5 years ago Medicines:                Monitored Anesthesia Care Procedure:                Pre-Anesthesia Assessment:                           - Prior to the procedure, a History and Physical                            was performed, and patient medications and                            allergies were reviewed. The patient's tolerance of                            previous anesthesia was also reviewed. The risks                            and benefits of the procedure and the sedation                            options and risks were discussed with the patient.                            All questions were answered, and informed consent                            was obtained. Prior Anticoagulants: The patient has                            taken no previous anticoagulant or antiplatelet                            agents. ASA Grade Assessment: II - A patient with                            mild systemic disease. After reviewing the risks                            and benefits, the patient was deemed in                            satisfactory condition to undergo the procedure.  After obtaining informed consent, the colonoscope                            was passed under direct vision. Throughout the                            procedure, the patient's blood pressure, pulse, and                            oxygen saturations were monitored continuously. The                            Model PCF-[id number]) scope was introduced                            through the anus and  advanced to the the cecum,                            identified by appendiceal orifice and ileocecal                            valve. The ileocecal valve, appendiceal orifice,                            and rectum were photographed. The quality of the                            bowel preparation was excellent. The colonoscopy                            was performed without difficulty. The patient                            tolerated the procedure well. Scope In: 11:06:29 AM Scope Out: 11:22:02 AM Scope Withdrawal Time: 0 hours 11 minutes 4 seconds  Total Procedure Duration: 0 hours 15 minutes 33 seconds  Findings:                 A 5 mm polyp was found in the transverse colon. The                            polyp was sessile. The polyp was removed with a                            cold biopsy forceps. The polyp was removed with a                            cold snare. Resection and retrieval were complete.                           The exam was otherwise without abnormality on                            direct and retroflexion  views. Complications:            No immediate complications. Estimated blood loss:                            None. Estimated Blood Loss:     Estimated blood loss: none. Impression:               - One 5 mm polyp in the transverse colon, removed                            with a cold snare and removed with a cold biopsy                            forceps. Resected and retrieved.                           - The examination was otherwise normal on direct                            and retroflexion views. Recommendation:           - Repeat colonoscopy in 5 years for surveillance.                           - Patient has a contact number available for                            emergencies. The signs and symptoms of potential                            delayed complications were discussed with the                            patient. Return to normal activities  tomorrow.                            Written discharge instructions were provided to the                            patient.                           - Resume previous diet.                           - Continue present medications.                           - Await pathology results. Ladene Artist, MD 05/03/2016 11:24:27 AM This report has been signed electronically.

## 2016-05-04 ENCOUNTER — Telehealth: Payer: Self-pay

## 2016-05-04 NOTE — Telephone Encounter (Signed)
  Follow up Call-  Call back number 05/03/2016 07/22/2015  Post procedure Call Back phone  # [phone]  Permission to leave phone message Yes Yes  Some recent data might be hidden     Patient questions:  Do you have a fever, pain , or abdominal swelling? No. Pain Score  0 *  Have you tolerated food without any problems? Yes.    Have you been able to return to your normal activities? Yes.    Do you have any questions about your discharge instructions: Diet   No. Medications  No. Follow up visit  No.  Do you have questions or concerns about your Care? No.  Actions: * If pain score is 4 or above: No action needed, pain <4.

## 2016-05-12 ENCOUNTER — Encounter: Payer: Self-pay | Admitting: Gastroenterology

## 2016-10-14 DIAGNOSIS — G4733 Obstructive sleep apnea (adult) (pediatric): Secondary | ICD-10-CM | POA: Diagnosis not present

## 2016-11-03 DIAGNOSIS — M25512 Pain in left shoulder: Secondary | ICD-10-CM | POA: Diagnosis not present

## 2016-11-08 ENCOUNTER — Telehealth (INDEPENDENT_AMBULATORY_CARE_PROVIDER_SITE_OTHER): Payer: Self-pay | Admitting: *Deleted

## 2016-11-08 NOTE — Telephone Encounter (Signed)
Patient called in this afternoon in regards to needing some relief for his left shoulder pain? He has an appointment but it is not until next month. His CB # [phone]. Thank you

## 2016-11-09 NOTE — Telephone Encounter (Signed)
Dr. Lorin Mercy called patient and left message for him to call back. Let's work him in sooner to see Dr. Lorin Mercy

## 2016-11-09 NOTE — Telephone Encounter (Signed)
Please advise 

## 2016-11-09 NOTE — Telephone Encounter (Signed)
I called and left message , he was not home. See if we can see him sooner to eval his shoulder problem. Thanks ucall.

## 2016-11-15 ENCOUNTER — Telehealth (INDEPENDENT_AMBULATORY_CARE_PROVIDER_SITE_OTHER): Payer: Self-pay | Admitting: Orthopaedic Surgery

## 2016-11-15 NOTE — Telephone Encounter (Signed)
Can you make patient appt tomorrow per Dr. Finis Bud. For left shoulder pain

## 2016-11-15 NOTE — Telephone Encounter (Signed)
Patient called asked for a call back concerning his appointment. The number to contact him is [phone]

## 2016-11-16 ENCOUNTER — Encounter (INDEPENDENT_AMBULATORY_CARE_PROVIDER_SITE_OTHER): Payer: Self-pay | Admitting: Orthopaedic Surgery

## 2016-11-16 ENCOUNTER — Ambulatory Visit (INDEPENDENT_AMBULATORY_CARE_PROVIDER_SITE_OTHER): Payer: PPO | Admitting: Orthopaedic Surgery

## 2016-11-16 ENCOUNTER — Encounter (INDEPENDENT_AMBULATORY_CARE_PROVIDER_SITE_OTHER): Payer: Self-pay

## 2016-11-16 VITALS — BP 143/72 | HR 70 | Ht 71.0 in | Wt 185.0 lb

## 2016-11-16 DIAGNOSIS — M7542 Impingement syndrome of left shoulder: Secondary | ICD-10-CM

## 2016-11-16 NOTE — Progress Notes (Signed)
Office Visit Note   Patient: Andrew Hayes           Date of Birth: [birth year]           MRN: [id number] Visit Date: 11/16/2016              Requested by: [name], MD [place], [place] [address] PCP: Gennette Pac, MD   Assessment & Plan: Visit Diagnoses:  1. Impingement syndrome of left shoulder     Plan: Subacromial injection performed left shoulder with the greater than 50% improvement in his symptoms. He'll see how he does with this and has increased symptoms to let us know try to avoid overhead and outstretched reaching activities with his left arm.  Follow-Up Instructions: No Follow-up on file.   Orders:  Orders Placed This Encounter  Procedures  . Large Joint Injection/Arthrocentesis   No orders of the defined types were placed in this encounter.     Procedures: Large Joint Inj Date/Time: 11/17/2016 10:02 AM Performed by: Marybelle Killings Authorized by: Marybelle Killings   Site marked: the procedure site was marked   Location:  Shoulder Site:  L subacromial bursa Needle Size:  22 G Ultrasound Guidance: No   Fluoroscopic Guidance: No   Arthrogram: No   Medications:  0.5 mL lidocaine 1 %; 3 mL bupivacaine 0.5 %; 40 mg methylPREDNISolone acetate 40 MG/ML Aspiration Attempted: No   Patient tolerance:  Patient tolerated the procedure well with no immediate complications     Clinical Data: No additional findings.   Subjective: Chief Complaint  Patient presents with  . Left Shoulder - Pain    Patient presents with left shoulder pain that is getting worse. He notices it more with certain movements of his arm. He states that the pain is not constant. He is right hand dominate. He takes advil only as needed. He is here for left shoulder injection.   Patient had pain with outstretched reaching and overhead activities. Problems when he dresses and puts a shirt on.  Review of Systems  Constitutional: Negative for chills and  diaphoresis.  HENT: Negative for ear discharge, ear pain and nosebleeds.   Eyes: Negative for discharge and visual disturbance.  Respiratory: Negative for cough, choking and shortness of breath.   Cardiovascular: Negative for chest pain and palpitations.  Gastrointestinal: Negative for abdominal distention and abdominal pain.  Endocrine: Negative for cold intolerance and heat intolerance.  Genitourinary: Negative for flank pain and hematuria.  Musculoskeletal:       Left shoulder pain nondominant arm progressive over the last 3-4 weeks. It bothers him at night wakes him up  Skin: Negative for rash and wound.  Neurological: Negative for seizures and speech difficulty.  Hematological: Negative for adenopathy. Does not bruise/bleed easily.  Psychiatric/Behavioral: Negative for agitation and suicidal ideas.   positive for hyperlipidemia and dysphasia in the past history of polyps. 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   Objective: Vital Signs: BP (!) 143/72   Pulse 70   Ht 5\' 11"  (1.803 m)   Wt 185 lb (83.9 kg)   BMI 25.80 kg/m   Physical Exam  Constitutional: He is oriented to person, place, and time. He appears well-developed and well-nourished.  HENT:  Head: Normocephalic and atraumatic.  Eyes: EOM are normal. Pupils are equal, round, and reactive to light.  Neck: No tracheal deviation present. No thyromegaly present.  Cardiovascular: Normal rate.   Pulmonary/Chest: Effort normal. He has  no wheezes.  Abdominal: Soft. Bowel sounds are normal.  Musculoskeletal:  No supraclavicular lymphadenopathy full range of motion of cervical spine. Positive impingement left shoulder negative right shoulder long head of the biceps is located. Negative drop arm test but he does have pain with supraspinatus resisted testing. Internal/external rotation a strong upper extremity reflexes are normal good grip strength. No evidence of peripheral nerve entrapment upper extremities.  Negative impingement right shoulder.  Neurological: He is alert and oriented to person, place, and time.  Skin: Skin is warm and dry. Capillary refill takes less than 2 seconds.  Psychiatric: He has a normal mood and affect. His behavior is normal. Judgment and thought content normal.    Ortho Exam  Specialty Comments:  No specialty comments available.  Imaging: No results found.   PMFS History: Patient Active Problem List   Diagnosis Date Noted  . Dysphagia 06/09/2015  . History of colonic polyps 06/09/2015  . Dizzy spells 03/08/2012  . Hyperlipidemia 03/08/2012   Past Medical History:  Diagnosis Date  . Actinic keratoses   . Diverticulosis    Mild  . Elevated fasting glucose    Mildly  . Herniated disc   . Junctional nevus   . Lipidemia    Mixed  . Mild sleep apnea   . Organic impotence   . Plantar fasciitis   . Prostate cancer [facility])     Family History  Problem Relation Age of Onset  . Stroke Mother   . Hypertension Mother   . Parkinson's disease Mother   . COPD Father   . Dementia Brother   . Hydrocephalus Brother   . Parkinson's disease Brother   . Parkinson's disease Sister   . Colon cancer Neg Hx     Past Surgical History:  Procedure Laterality Date  . APPENDECTOMY    . COLONOSCOPY    . ELBOW SURGERY  2013  . KNEE ARTHROSCOPY  2013   knee and elbow  . PROSTATECTOMY  1986  . TONSILLECTOMY     Social History   Occupational History  . cpa Retired    retired   Social History Main Topics  . Smoking status: Former Smoker    Packs/day: 2.00    Years: 24.00    Types: Cigarettes    Quit date: 10/11/1984  . Smokeless tobacco: Former Systems developer  . Alcohol use 4.2 oz/week    7 Standard drinks or equivalent per week     Comment: 7 beers weekly  . Drug use: No  . Sexual activity: Not on file

## 2016-11-17 DIAGNOSIS — D225 Melanocytic nevi of trunk: Secondary | ICD-10-CM | POA: Diagnosis not present

## 2016-11-17 DIAGNOSIS — L821 Other seborrheic keratosis: Secondary | ICD-10-CM | POA: Diagnosis not present

## 2016-11-17 DIAGNOSIS — Z23 Encounter for immunization: Secondary | ICD-10-CM | POA: Diagnosis not present

## 2016-11-17 DIAGNOSIS — Z86018 Personal history of other benign neoplasm: Secondary | ICD-10-CM | POA: Diagnosis not present

## 2016-11-17 DIAGNOSIS — M7542 Impingement syndrome of left shoulder: Secondary | ICD-10-CM | POA: Diagnosis not present

## 2016-11-17 DIAGNOSIS — Z85828 Personal history of other malignant neoplasm of skin: Secondary | ICD-10-CM | POA: Diagnosis not present

## 2016-11-17 DIAGNOSIS — L570 Actinic keratosis: Secondary | ICD-10-CM | POA: Diagnosis not present

## 2016-11-17 MED ORDER — LIDOCAINE HCL 1 % IJ SOLN
0.5000 mL | INTRAMUSCULAR | Status: AC | PRN
  Administered 2016-11-17: .5 mL

## 2016-11-17 MED ORDER — METHYLPREDNISOLONE ACETATE 40 MG/ML IJ SUSP
40.0000 mg | INTRAMUSCULAR | Status: AC | PRN
  Administered 2016-11-17: 40 mg via INTRA_ARTICULAR

## 2016-11-17 MED ORDER — BUPIVACAINE HCL 0.5 % IJ SOLN
3.0000 mL | INTRAMUSCULAR | Status: AC | PRN
  Administered 2016-11-17: 3 mL via INTRA_ARTICULAR

## 2016-12-07 ENCOUNTER — Ambulatory Visit (INDEPENDENT_AMBULATORY_CARE_PROVIDER_SITE_OTHER): Payer: Medicare Other | Admitting: Orthopaedic Surgery

## 2016-12-16 DIAGNOSIS — L570 Actinic keratosis: Secondary | ICD-10-CM | POA: Diagnosis not present

## 2016-12-20 ENCOUNTER — Encounter: Payer: Self-pay | Admitting: Neurology

## 2016-12-22 ENCOUNTER — Encounter: Payer: Self-pay | Admitting: Neurology

## 2016-12-22 ENCOUNTER — Ambulatory Visit (INDEPENDENT_AMBULATORY_CARE_PROVIDER_SITE_OTHER): Payer: PPO | Admitting: Neurology

## 2016-12-22 VITALS — BP 146/72 | HR 78 | Resp 18 | Ht 71.0 in | Wt 186.0 lb

## 2016-12-22 DIAGNOSIS — R419 Unspecified symptoms and signs involving cognitive functions and awareness: Secondary | ICD-10-CM | POA: Diagnosis not present

## 2016-12-22 DIAGNOSIS — R42 Dizziness and giddiness: Secondary | ICD-10-CM

## 2016-12-22 DIAGNOSIS — Z818 Family history of other mental and behavioral disorders: Secondary | ICD-10-CM

## 2016-12-22 DIAGNOSIS — Z9989 Dependence on other enabling machines and devices: Secondary | ICD-10-CM | POA: Diagnosis not present

## 2016-12-22 DIAGNOSIS — G4733 Obstructive sleep apnea (adult) (pediatric): Secondary | ICD-10-CM

## 2016-12-22 DIAGNOSIS — R202 Paresthesia of skin: Secondary | ICD-10-CM

## 2016-12-22 NOTE — Progress Notes (Signed)
Subjective:    Patient ID: Andrew Hayes is a 76 y.o. male.  HPI     Interim history:   Andrew Hayes is a 76 year old right-handed gentleman with an underlying medical history of prostate cancer diagnosed in 1996, hyperlipidemia, colonic polyps, diverticulosis, plantar fasciitis, vertigo and dizziness, who presents for follow-up consultation of his OSA, on CPAP therapy and his history of intermittent paresthesias. The patient is unaccompanied today. I last saw him on 12/23/2015, at which time he was fully compliant with CPAP therapy and reported doing well, had no recent concerns, but was overall still worries about his family history which included Parkinson's disease, NPH, dementia. His exam was stable. I suggested a one-year checkup.  Today, 12/22/2016 (all dictated new, as well as above notes, some dictation done in note pad or Word, outside of chart, may appear as copied):  I reviewed his CPAP compliance data from 11/21/2016 through 12/20/2016, which is a total of 30 days, during which time he used his CPAP every night with percent used days greater than 4 hours at 100%, indicating superb compliance with an average usage of 6 hours and 39 minutes, residual AHI borderline at 6.3 per hour, leak on the high side for the 95th percentile at 22 L/m on a pressure of 8 cm. He reports doing okay, compliant with CPAP, which is going well. He has been seeing Dr. Lorin Mercy for left shoulder pain and has undergone injection into the left shoulder. Left shoulder feels better. He still worried about his memory and cognitive function. He has not had any recent issues but worries about developing dementia because his brother developed dementia. Brother lived to be 80 years old and had a diagnosis of dementia, hydrocephalus, status post shunt placement. Brother also was a Psychologist, educational for several years and likely had several concussions and overall did not take good care of himself per patient. He has a sister  who is now 17 years old and is oxygen dependent, has lung disease and heart disease. Both parents lived to be in their 55s. Father had no memory issues, mom developed memory issues in her 79s. No recent issues with paresthesias, sometimes he has tingling in his feet which comes and goes, has ongoing issues with recurrent vertigo spells, had a spell a couple of months ago, lasted about a week, exercises have not helped for this. He just tries to ride it out.   The patient's allergies, current medications, family history, past medical history, past social history, past surgical history and problem list were reviewed and updated as appropriate.   Previously (copied from previous notes for reference):    I saw him on 06/18/2015 at which time he was referred by his primary care physician for problems with paresthesias and concern for neuropathy. He reported tingling sensation in both feet. I reviewed Dr. Eddie Dibbles office note from 06/06/2015. Laboratory test results were reviewed from 06/06/2015: CBC with differential was unremarkable with the exception of MCV of 96.4, CMP was unremarkable with the exception of total bilirubin of 1.7, vitamin B12 was 259, TSH normal at 1.53. He described an abnormal sensation at the bottom of his feet but no pain. This was ongoing for 3-4 months at the time. He denied any actual numbness. He had noted no weakness. He had never been told that he had diabetes and was never a heavy alcohol consumer, drinking about 1 beer/day. I suggested we proceed with an EMG and nerve conduction tests. He had this on 07/15/2015: IMPRESSION:  This is a normal study. There was no electrodiagnostic evidence of large fiber peripheral neuropathy or left lumbosacral radiculopathy. But today's test could not rule out the possibility of small fiber neuropathy, if clinically indicated, may consider skin biopsy.   We called him with his test results.   I reviewed his CPAP compliance data from 11/22/2015  through 12/21/2015 which is a total of 30 days during which time he used his machine every night with percent used days greater than 4 hours at 100%, indicating superb compliance with an average usage of 6 hours and 19 minutes, residual AHI 3.4 per hour, leak acceptable with the 95th percentile at 17.4 L/m on a pressure of 8 cm.   I saw him on 05/23/2015 for a new problem of possible hydrocephalus. The patient is unaccompanied today. At the last visit he reported 2 recent episodes of vertigo. He has a long-standing history of vertigo of over 30 years duration. He reported staying active and drinking enough water. He was drinking one beer per day and reported smoking cessation and about 30 years prior. He was a heavy smoker in the past. We talked about his brain MRI with and without contrast from 05/02/2015: No imaging explanation for vestibular cochlear symptoms as above. Ventriculomegaly. Are there any symptoms of normal pressure hydrocephalus? In addition, I personally reviewed the images through the PACS system and I shared images with him on my computer. He was referred by his ENT doctor, Dr. Janace Hoard who follows him for his Mnire's disease and his vertigo. The patient reported that his memory was not as good. He reported that his older brother died at the age of 74 and was diagnosed with NPH and had a shunt placed. His brother had memory loss but also did not take care of himself very well per patient. The patient was compliant with CPAP treatment felt well with it.    I saw him on 12/25/2014 for follow-up of his OSA. He was compliant with CPAP therapy at the time.    I reviewed his CPAP compliance data from 04/22/2015 through 05/21/2015 which is a total of 30 days during which time he used his machine 22 days with percent used days greater than 4 hours at 70%, indicating adequate compliance with an average usage of 4 hours and 20 minutes. Residual AHI borderline at 5.8 per hour, leaked low with the 95th  percentile at 16.9 L/m on a pressure of 8 cm.     I saw him on 09/19/2014, at which time that he reported trying to be compliant with treatment. He did not do well with nasal pillows. He had trouble tolerating a chinstrap. He had issues with air leaking from the mask. Overall, however, he felt that he was sleeping better since treatment was started. He denied any significant restless leg symptoms. His balance had improved. For better tolerance of reduce his pressure to 8 cm from 9 cm.     I reviewed his CPAP compliance data from 11/24/2014 through 12/23/2014 which is a total of 30 days during which time he used his machine every night with percent used days greater than 4 hours at 97% indicating excellent compliance with an average usage of 5 hours and 38 minutes, setting at 8 cm with EPR of 1. Residual AHI has increased to 7 per hour but mostly because of central apneas. When we look back at his CPAP titration study results from September 2015, he did have central apneas throughout the study on CPAP then  but none with any desaturations. His leak fluctuates in the 95th percentile is at 22.2 L/m at this time. He's using a chinstrap every night. He still does not like it. He worries about his memory but has not had any recent decline. He has a family history of dementia and his older brother. He worries if he should be on a statin. He is on low-dose atorvastatin, 10 mg. He has primarily high triglycerides. He has an appointment with his primary care physician coming up for his yearly checkup. He has seen ENT for vertigo. He has had no recent issues. He does have hearing loss bilaterally.     I first met him on 02/14/2014 at the request of his primary care physician, at which time the patient reported non-restorative sleep, snoring, daytime somnolence and nighttime awakenings, essentially since 1996 (after his prostate cancer surgery). I suggested he return for sleep study. He had a baseline sleep study on  04/18/2014 followed by a CPAP titration study on 06/18/2014 and went over his test results with him in detail today. His baseline sleep study showed a reduced sleep efficiency at 72.8% with a latency to sleep normal at 11 minutes and wake after sleep onset elevated at 92 minutes with mild sleep fragmentation noted. The arousal index was mildly elevated. He had an increased percentage of stage II sleep, 3.4% of slow-wave sleep, and REM sleep at 19.3% with a normal REM latency of 96.5 minutes. He had no significant PLMS or EKG changes. He had no significant EEG changes. He had mild intermittent snoring. Total AHI was 8.4 per hour, rising to 12.3 per hour during REM sleep and 15.3 per hour in the supine position. Average oxygen saturation was 95%, nadir was 81% in REM sleep. Based on his sleep related complaints I advised him to return for another sleep study for CPAP titration. His sleep efficiency during the second study was 72.8%. Latency to sleep was normal at 8 minutes and wake after sleep onset was elevated at 91.5 minutes with moderate sleep fragmentation noted. He had a mildly elevated arousal index. He had a normal percentage of light stage sleep, an increased percentage of deep sleep at 29.4% and a mildly reduced percentage of REM sleep at 16.5% with a normal REM latency. He had mild PLMS at 8.5 per hour with an associated arousal index of 4.5 per hour. He had no significant EKG or EEG changes. He was titrated on CPAP from 5-13 cm. AHI was 1.8 per hour at the pressure of 9. Supine REM sleep was achieved. He did indicate sleeping better with CPAP during that night.   I reviewed his CPAP compliance data from 08/14/2014 through 09/12/2014 which is a total of 30 days during which time he used his machine 26 days. Percent used days greater than 4 hours was 83%, indicating fairly good compliance. Average usage was 4 hours and 31 minutes. Pressure at 9 cm. Leak was at times high with the 95th percentile at 27.5  L/m. Average AHI was suboptimal at 7.5 per hour. Looking at the breakdown of his AHI it appears that his central apnea index was elevated at 4.2 per hour.     He had seen Dr. Maxwell Caul for OSA before and had a home sleep test on 10/25/2013 which indicated no significant sleep disorder breathing with an AHI estimated at 3 per hour. Time below 89% saturation was 2 minutes. He reports a bedtime of 11:30 to 11:45 PM, and goes to sleep in minutes, with  occasional OTC sleep aid, but not on a night to night basis. He wakes up 1-2 times per night, and sometimes his mind races and he feels it helps to turn his radio on for distraction. He goes to the bathroom 0-1 times per night. His wife's sleep used to disturb him, because of her snoring and restlessness, but they have been sleeping in separate bedrooms for years. He does not wake up with headaches. He snores, but did not used to snore. He has been able to lose weight and plays golf 2 times a week, goes to the gym 2 times per week. He likes fly fishing. He does not wake up rested. His rise time is between 5:30 to 6 AM. He rarely dreams. He is sleepy during the day, but does not take a nap on a scheduled basis. If he lies down in the afternoon, he will go to sleep. His ESS is 14/24 today. He has fallen asleep at the wheel x 2, both of which happened after a long day of fishing on a longer drive home. He has not had a MVA and no dozing off at the wheel in the last 3 years.   There is no report of nighttime reflux, with no nighttime cough experienced. The patient has not noted any RLS symptoms and is not known to kick while asleep or before falling asleep. There is no family history of RLS or OSA. Father was a heavy snorer, mother had some sleep disruption.    He is not a very restless sleeper.    He denies cataplexy, sleep paralysis, hypnagogic or hypnopompic hallucinations, or sleep attacks. He does not report any vivid dreams, nightmares, dream enactments, or  parasomnias, such as sleep talking or sleep walking. The patient has not had an attended sleep study.   He consumes 2 to 3 caffeinated beverages per day, usually in the form of coffee in the morning and ice tea with meals some time.   His bedroom is usually dark and cool. There is no TV in the bedroom and he uses a radio, with low volume.   He quit smoking in '86 and drinks 1 beer per day.    His Past Medical History Is Significant For: Past Medical History:  Diagnosis Date  . Actinic keratoses   . Diverticulosis    Mild  . Elevated fasting glucose    Mildly  . Herniated disc   . Junctional nevus   . Lipidemia    Mixed  . Mild sleep apnea   . Organic impotence   . Plantar fasciitis   . Prostate cancer [facility])     His Past Surgical History Is Significant For: Past Surgical History:  Procedure Laterality Date  . APPENDECTOMY    . COLONOSCOPY    . ELBOW SURGERY  2013  . KNEE ARTHROSCOPY  2013   knee and elbow  . PROSTATECTOMY  1986  . TONSILLECTOMY      His Family History Is Significant For: Family History  Problem Relation Age of Onset  . Stroke Mother   . Hypertension Mother   . Parkinson's disease Mother   . COPD Father   . Dementia Brother   . Hydrocephalus Brother   . Parkinson's disease Brother   . Parkinson's disease Sister   . Colon cancer Neg Hx     His Social History Is Significant For: Social History   Social History  . Marital status: Married    Spouse name: Hassan Rowan  . Number of  children: 2  . Years of education: 50   Occupational History  . cpa Retired    retired   Social History Main Topics  . Smoking status: Former Smoker    Packs/day: 2.00    Years: 24.00    Types: Cigarettes    Quit date: 10/11/1984  . Smokeless tobacco: Former Systems developer  . Alcohol use 4.2 oz/week    7 Standard drinks or equivalent per week     Comment: 7 beers weekly  . Drug use: No  . Sexual activity: Not Asked   Other Topics Concern  . None   Social History  Narrative   Patient lives at home with family.   Patient consumes 2-3 cups of caffeine daily,right handed.   2 children (1 deceased)    His Allergies Are:  Allergies  Allergen Reactions  . Percocet [Oxycodone-Acetaminophen] Other (See Comments)    Patient states he is unable to tolerate  :   His Current Medications Are:  Outpatient Encounter Prescriptions as of 12/22/2016  Medication Sig  . aspirin 81 MG tablet Take 81 mg by mouth daily.  Marland Kitchen atorvastatin (LIPITOR) 10 MG tablet Take 1 tablet by mouth daily.  . cholecalciferol (VITAMIN D) 1000 units tablet Take 2,000 Units by mouth daily.  Marland Kitchen co-enzyme Q-10 30 MG capsule Take 30 mg by mouth 2 (two) times daily.   . cyanocobalamin 1000 MCG tablet Take 1,000 mcg by mouth daily.  . Multiple Vitamin (MULTI-VITAMIN PO) Take by mouth daily.  . Omega-3 Fatty Acids (FISH OIL PO) Take by mouth daily.   No facility-administered encounter medications on file as of 12/22/2016.   :  Review of Systems:  Out of a complete 14 point review of systems, all are reviewed and negative with the exception of these symptoms as listed below: Review of Systems  Neurological:       Patient states that he is doing well with CPAP.  Patient is concerned with memory loss, states that his brother had dementia.     Objective:  Neurologic Exam  Physical Exam Physical Examination:   Vitals:   12/22/16 0937  BP: (!) 146/72  Pulse: 78  Resp: 18   General Examination: The patient is a very pleasant 76 y.o. male in no acute distress. He appears well-developed and well-nourished and well groomed.   HEENT: Normocephalic, atraumatic, pupils are equal, round and reactive to light and accommodation. He has corrective eyeglasses. Extraocular tracking is good without limitation to gaze excursion or nystagmus noted. Normal smooth pursuit is noted. Hearing is mildly impaired. Face is symmetric with normal facial animation and normal facial sensation. Speech is clear with  no dysarthria noted. There is no hypophonia. There is no lip, neck/head, jaw or voice tremor. Neck is supple with full range of passive and active motion. There are no carotid bruits on auscultation. Oropharynx exam reveals: no significant mouth dryness, adequate dental hygiene and moderate airway crowding. Mallampati is class II. Tongue protrudes centrally and palate elevates symmetrically. Tonsils are absent.   Chest: Clear to auscultation without wheezing, rhonchi or crackles noted.  Heart: S1+S2+0, regular and normal without murmurs, rubs or gallops noted.   Abdomen: Soft, non-tender and non-distended with normal bowel sounds appreciated on auscultation.  Extremities: There is no pitting edema in the distal lower extremities bilaterally. Pedal pulses are intact.  Skin: Warm and dry without trophic changes noted.  Musculoskeletal: exam reveals no obvious joint deformities, tenderness or joint swelling or erythema.   Neurologically:  Mental status:  The patient is awake, alert and oriented in all 4 spheres. His immediate and remote memory, attention, language skills and fund of knowledge are appropriate. There is no evidence of aphasia, agnosia, apraxia or anomia. Speech is clear with normal prosody and enunciation. Thought process is linear. Mood is normal and affect is normal.   On 12/22/2016: MMSE: 30/30, CDT: 4/4, AFT 17/min.   Cranial nerves II - XII are as described above under HEENT exam. In addition: shoulder shrug is normal with equal shoulder height noted. Motor exam: Normal bulk, strength and tone is noted. There is no drift, tremor or rebound. Romberg is negative. Reflexes are 1+ throughout. Fine motor skills and coordination: intact with normal finger taps, normal hand movements, normal rapid alternating patting, normal foot taps and normal foot agility.  Cerebellar testing: No dysmetria or intention tremor on finger to nose testing. Heel to shin is unremarkable bilaterally. There  is no truncal or gait ataxia.  Sensory exam: intact to light touch in the upper and lower extremities.  Gait, station and balance: He stands easily. No veering to one side is noted. No leaning to one side is noted. Posture is age-appropriate and stance is narrow based. Gait shows normal stride length and normal pace. No problems turning are noted. Tandem walk is difficult for him (not a new problem).  Assessment and Plan:  In summary, Andrew Hayes is a very pleasant 76 y.o.-year old male with an underlying medical history of hyperlipidemia, prostate cancer in 1996, colonic polyps, diverticulosis, plantar fasciitis, recurrent vertigo, cognitive complaints, intermittent paresthesias and obstructive sleep apnea, well established on CPAP therapy, who returns for follow-up. He has remained stable in this past year, no significant exacerbation of paresthesias, in fact stable findings, has had a couple of bouts of vertigo infrequently, tries to write them out, exercises don't help and he has found no triggers. He has a family history of dementia in his brother. He still worries about it. His brain MRI in 2016 showed some white matter changes and some ventriculomegaly but clinically he has no symptoms or signs of hydrocephalus or parkinsonism, he has a family history of PD as well. Memory scores are good. He tries to take good care of himself, exercises regularly, has lost a little bit of weight compared to last year and has a target of losing another 5 pounds. He is reassured overall, he is compliant with CPAP therapy, borderline AHI is noted but mostly central in nature so increasing his CPAP pressure will not help. I suggested he continue with the current management, try to stay active mentally and physically, stay well-hydrated and well rested. For his cognitive complaints, I suggested we pursue cognitive testing through a trained neuropsychologist. He is agreeable. I made a referral in that regard and we  will keep them posted as to the results. He will also likely have a discussion with a psychologist as to the results and further suggestions. From my end of things I would like to see him back in a year, sooner as needed. Answered all his questions today and he was in agreement.  I spent 30 minutes in total face-to-face time with the patient, more than 50% of which was spent in counseling and coordination of care, reviewing test results, reviewing medication and discussing or reviewing the diagnosis of OSA, cogn complaints, paresthesias, rec vertigo, the prognosis and treatment options. Pertinent laboratory and imaging test results that were available during this visit with the patient were reviewed by me  and considered in my medical decision making (see chart for details).

## 2016-12-22 NOTE — Patient Instructions (Addendum)
Please continue using your CPAP regularly. While your insurance requires that you use CPAP at least 4 hours each night on 70% of the nights, I recommend, that you not skip any nights and use it throughout the night if you can. Getting used to CPAP and staying with the treatment long term does take time and patience and discipline. Untreated obstructive sleep apnea when it is moderate to severe can have an adverse impact on cardiovascular health and raise her risk for heart disease, arrhythmias, hypertension, congestive heart failure, stroke and diabetes. Untreated obstructive sleep apnea causes sleep disruption, nonrestorative sleep, and sleep deprivation. This can have an impact on your day to day functioning and cause daytime sleepiness and impairment of cognitive function, memory loss, mood disturbance, and problems focussing. Using CPAP regularly can improve these symptoms.  We will request a formal neuropsychological test (aka cognitive testing) for your memory complaints and family history of dementia. This requires a referral to a trained and licensed neuropsychologist and will be a separate appointment at a different clinic.

## 2016-12-28 ENCOUNTER — Ambulatory Visit (INDEPENDENT_AMBULATORY_CARE_PROVIDER_SITE_OTHER): Payer: PPO | Admitting: Psychology

## 2016-12-28 DIAGNOSIS — R419 Unspecified symptoms and signs involving cognitive functions and awareness: Secondary | ICD-10-CM

## 2016-12-28 NOTE — Progress Notes (Signed)
NEUROPSYCHOLOGICAL INTERVIEW (CPT: [id number])  Name: Andrew Hayes Date of Birth: [birth year] Date of Interview: 12/28/2016  Reason for Referral:  Andrew Hayes is a 76 y.o. male who is referred for neuropsychological evaluation by Dr. Star Age of [facility] due to concerns about memory loss. This patient is unaccompanied in the office for today's appointment.  History of Presenting Problem:  Mr. Mckeithan reported gradual onset of mild memory difficulties and some decline over the years. He reported he is likely more aware of his memory decline because his brother, who was seven years his senior, passed away with Alzheimer's disease four years ago. In an effort to keep body and brain as healthy as possible, the patient stays physically active and maintains a healthy diet.   When asked about cognitive symptoms he experiences, the patient reports that his wife will tell him to do something, and he will wander off and do something else. He reported he will read the newspaper and then have more difficulty recalling the details of what he read the next day. He has more trouble recalling names of people. Upon direct questioning, he endorsed misplacing or losing items a little more often than he used to, as well as mild word finding difficulty. He also admits it may take him a little more time and effort to think of which route is best when he drives somewhere. He has never gotten lost or completely forgotten how to get somewhere. He reports that the cognitive symptoms he endorsed do not interfere with his daily life. He continues to manage all instrumental ADLs, including driving, medications, finances and appointments, without any difficulty.   Physically, he reports he feels "really good". He has sleep apnea but is compliant with his CPAP. He endorses mildly reduced balance and he tries to work on this at the gym. He has not had any significant falls (he has slipped on wet  rocks while wading/fishing in a stream but this is somewhat expected for anyone). He has a long history of intermittent severe vertigo (happens 1-2 times a year) wherein he has to lay flat. If he lifts his head up during one of these episodes, he immediately vomits. These are very debilitating when they occur. The last one was in December 2017 and he had to lay in bed for 45 hours. He got up only 3 times (to use the restroom) and he vomited each time. Once he can get up and move around, he feels in a fog and "not quite right" for a few days before returning to baseline.  He describes his mood as good and stable. He denies depressed mood or significant anxiety. He reports a supportive and positive relationship with his wife of 64 years. He is socially active and enjoys golfing and trout fishing. He denied psychiatric history.   Social History: Born/Raised: Born in [place], moved to [place] at [employer] Educational/Occupational History: Completed bachelor's degree in Press photographer at [employer]. Subsequently he served in [employer] for 4 years in [place]. 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 He retired from full time work around [employer]. 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 Marital history: Married x52 years with one daughter who is a Probation officer and lives in [place]. He and his wife had another daughter who passed away at 37 mos of age due to a heart defect.  Alcohol/Tobacco/Substances: He currently consumes  1 beer a day on average. He rarely consumes any more than that (2 drinks at most). He denied history of alcohol problems in the past. He is a former smoker (smoked from college til 1986). No SA.   Medical History: Past Medical History:  Diagnosis Date  . Actinic keratoses   . Diverticulosis    Mild  . Elevated fasting glucose    Mildly  . Herniated disc   . Junctional nevus   . Lipidemia     Mixed  . Mild sleep apnea   . Organic impotence   . Plantar fasciitis   . Prostate cancer [facility])    Prostate Ca was dx at age 89. Had radical surgery.    Current Medications:  Outpatient Encounter Prescriptions as of 12/28/2016  Medication Sig  . aspirin 81 MG tablet Take 81 mg by mouth daily.  Marland Kitchen atorvastatin (LIPITOR) 10 MG tablet Take 1 tablet by mouth daily.  . cholecalciferol (VITAMIN D) 1000 units tablet Take 2,000 Units by mouth daily.  Marland Kitchen co-enzyme Q-10 30 MG capsule Take 30 mg by mouth 2 (two) times daily.   . cyanocobalamin 1000 MCG tablet Take 1,000 mcg by mouth daily.  . Multiple Vitamin (MULTI-VITAMIN PO) Take by mouth daily.  . Omega-3 Fatty Acids (FISH OIL PO) Take by mouth daily.   No facility-administered encounter medications on file as of 12/28/2016.      Behavioral Observations:   Appearance: Neatly and appropriately dressed and groomed Gait: Ambulated independently, no abnormalities observed Speech: Fluent; normal rate, rhythm and volume Thought process: Linear, goal directed Affect: Full, euthymic Interpersonal: Very pleasant, appropriate   TESTING: There is medical necessity to proceed with neuropsychological assessment as the results will be used to aid in differential diagnosis and clinical decision-making and to inform specific treatment recommendations. Per the patient and medical records reviewed, there has been a change in cognitive functioning and a reasonable suspicion of mild cognitive impairment.   The patient expressed that he is indecisive about completing the neurocognitive testing. I described why this testing will be helpful, including that it will provide a baseline for future comparison. He is still not sure if he wants to do the testing, so he cancelled his testing appointment and will call us when/if he decides to reschedule.    PLAN: If the patient does decide to go through with the full evaluation, he will return for a full battery of  neuropsychological testing with a psychometrician under my supervision. Education regarding testing procedures was provided. Subsequently, the patient will see this provider for a follow-up session at which time his test performances and my impressions and treatment recommendations will be reviewed in detail.  Full neuropsychological evaluation report would then follow.

## 2016-12-29 ENCOUNTER — Encounter: Payer: Self-pay | Admitting: Psychology

## 2017-01-18 ENCOUNTER — Encounter: Payer: Medicare Other | Admitting: Psychology

## 2017-02-08 DIAGNOSIS — G4733 Obstructive sleep apnea (adult) (pediatric): Secondary | ICD-10-CM | POA: Diagnosis not present

## 2017-03-21 DIAGNOSIS — Z125 Encounter for screening for malignant neoplasm of prostate: Secondary | ICD-10-CM | POA: Diagnosis not present

## 2017-03-21 DIAGNOSIS — E7800 Pure hypercholesterolemia, unspecified: Secondary | ICD-10-CM | POA: Diagnosis not present

## 2017-03-21 DIAGNOSIS — Z79899 Other long term (current) drug therapy: Secondary | ICD-10-CM | POA: Diagnosis not present

## 2017-03-23 DIAGNOSIS — E7800 Pure hypercholesterolemia, unspecified: Secondary | ICD-10-CM | POA: Diagnosis not present

## 2017-03-23 DIAGNOSIS — Z86010 Personal history of colonic polyps: Secondary | ICD-10-CM | POA: Diagnosis not present

## 2017-03-23 DIAGNOSIS — G4730 Sleep apnea, unspecified: Secondary | ICD-10-CM | POA: Diagnosis not present

## 2017-03-23 DIAGNOSIS — R7301 Impaired fasting glucose: Secondary | ICD-10-CM | POA: Diagnosis not present

## 2017-03-23 DIAGNOSIS — K219 Gastro-esophageal reflux disease without esophagitis: Secondary | ICD-10-CM | POA: Diagnosis not present

## 2017-03-23 DIAGNOSIS — Z79899 Other long term (current) drug therapy: Secondary | ICD-10-CM | POA: Diagnosis not present

## 2017-03-23 DIAGNOSIS — H8109 Meniere's disease, unspecified ear: Secondary | ICD-10-CM | POA: Diagnosis not present

## 2017-03-23 DIAGNOSIS — L570 Actinic keratosis: Secondary | ICD-10-CM | POA: Diagnosis not present

## 2017-03-23 DIAGNOSIS — Z8546 Personal history of malignant neoplasm of prostate: Secondary | ICD-10-CM | POA: Diagnosis not present

## 2017-03-23 DIAGNOSIS — Z0000 Encounter for general adult medical examination without abnormal findings: Secondary | ICD-10-CM | POA: Diagnosis not present

## 2017-03-23 DIAGNOSIS — Z125 Encounter for screening for malignant neoplasm of prostate: Secondary | ICD-10-CM | POA: Diagnosis not present

## 2017-04-27 DIAGNOSIS — L570 Actinic keratosis: Secondary | ICD-10-CM | POA: Diagnosis not present

## 2017-05-16 ENCOUNTER — Encounter: Payer: Self-pay | Admitting: Neurology

## 2017-05-17 ENCOUNTER — Telehealth: Payer: Self-pay | Admitting: Neurology

## 2017-05-17 NOTE — Telephone Encounter (Signed)
Pt returned my call. He reports a bad vertigo spell several weeks ago. Pt's balance is off, and he is unable to function normally because of it. He reports using his cpap compliantly. I offered him an appt with Dr. Rexene Alberts tomorrow at 2:30pm, which pt accepted. I did warn pt that I will inform Dr. Rexene Alberts of what is going on and if she does not recommend an appt tomorrow, I will call him again. Pt verbalized understanding.

## 2017-05-17 NOTE — Telephone Encounter (Signed)
Patient having vertigo problems 5-6 weeks and worse today. His balance is very unsteady, wobbly ad affecting his activities.

## 2017-05-17 NOTE — Telephone Encounter (Signed)
I called pt to discuss. No answer, left a message asking him to call me back. 

## 2017-05-18 ENCOUNTER — Ambulatory Visit (INDEPENDENT_AMBULATORY_CARE_PROVIDER_SITE_OTHER): Payer: PPO | Admitting: Neurology

## 2017-05-18 ENCOUNTER — Encounter: Payer: Self-pay | Admitting: Neurology

## 2017-05-18 VITALS — Ht 70.0 in | Wt 183.0 lb

## 2017-05-18 DIAGNOSIS — H8190 Unspecified disorder of vestibular function, unspecified ear: Secondary | ICD-10-CM

## 2017-05-18 DIAGNOSIS — R42 Dizziness and giddiness: Secondary | ICD-10-CM

## 2017-05-18 DIAGNOSIS — H8120 Vestibular neuronitis, unspecified ear: Secondary | ICD-10-CM | POA: Diagnosis not present

## 2017-05-18 NOTE — Progress Notes (Signed)
Subjective:    Patient ID: Andrew Hayes is a 76 y.o. male.  HPI     Interim history:   Mr. Diekman is a 76 year old right-handed gentleman with an underlying medical history of prostate cancer diagnosed in 1996, hyperlipidemia, colonic polyps, diverticulosis, plantar fasciitis, vertigo and dizziness, who presents for a sooner than scheduled appointment for his recurrent vertigo spells. The patient is unaccompanied today. I last saw him on 12/22/2016, at which time he was compliant with his CPAP. AHI was borderline at 6.3 per hour. He had started seeing Dr. Lorin Mercy for his left shoulder pain, status post injection into the left shoulder. He was worried about his memory and cognitive function, MMSE was 30 out of 30 at the time. He was agreeable to pursuing neuropsychological evaluation. He had a consultation with Dr. Bonita Quin, but opted not to pursue the full cognitive testing at the time.  He called in the interim reporting recurrent bouts of vertigo.  Today, 05/18/2017: (all dictated new, as well as above notes, some dictation done in note pad or Word, outside of chart, may appear as copied): I reviewed his CPAP compliance data from 04/17/2017 through 05/16/2017, which is a total of 30 days, during which time he used his machine every night with percent used days greater than 4 hours at 97%, indicating excellent compliance with an average usage of 6 hours and 31 minutes, residual AHI 4.8 per hour, leak acceptable with the 95th percentile at 13.8 L/m on a pressure of 8 cm. He reports a couple of bad vertigo spells about 2 times a year, lasting up to 2 ful days, and any and every head movement causes severe vertigo, associated with N/V. He has a few days afterwards where he feels off, like a hangover. However, this feeling has lingered for much longer. He goes fly fishing and felt worse. Golfing is not as good. Feels some lightheadness some, no falls, no LOC. Tries to stay active, hydrates well.      The patient's allergies, current medications, family history, past medical history, past social history, past surgical history and problem list were reviewed and updated as appropriate.    Previously (copied from previous notes for reference):    I reviewed his CPAP compliance data from 11/21/2016 through 12/20/2016, which is a total of 30 days, during which time he used his CPAP every night with percent used days greater than 4 hours at 100%, indicating superb compliance with an average usage of 6 hours and 39 minutes, residual AHI borderline at 6.3 per hour, leak on the high side for the 95th percentile at 22 L/m on a pressure of 8 cm.   I saw him on 12/23/2015, at which time he was fully compliant with CPAP therapy and reported doing well, had no recent concerns, but was overall still worries about his family history which included Parkinson's disease, NPH, dementia. His exam was stable. I suggested a one-year checkup.    I saw him on 06/18/2015 at which time he was referred by his primary care physician for problems with paresthesias and concern for neuropathy. He reported tingling sensation in both feet. I reviewed Dr. Eddie Dibbles office note from 06/06/2015. Laboratory test results were reviewed from 06/06/2015: CBC with differential was unremarkable with the exception of MCV of 96.4, CMP was unremarkable with the exception of total bilirubin of 1.7, vitamin B12 was 259, TSH normal at 1.53. He described an abnormal sensation at the bottom of his feet but no pain. This was  ongoing for 3-4 months at the time. He denied any actual numbness. He had noted no weakness. He had never been told that he had diabetes and was never a heavy alcohol consumer, drinking about 1 beer/day. I suggested we proceed with an EMG and nerve conduction tests. He had this on 07/15/2015: IMPRESSION:   This is a normal study. There was no electrodiagnostic evidence of large fiber peripheral neuropathy or left lumbosacral  radiculopathy. But today's test could not rule out the possibility of small fiber neuropathy, if clinically indicated, may consider skin biopsy.   We called him with his test results.   I reviewed his CPAP compliance data from 11/22/2015 through 12/21/2015 which is a total of 30 days during which time he used his machine every night with percent used days greater than 4 hours at 100%, indicating superb compliance with an average usage of 6 hours and 19 minutes, residual AHI 3.4 per hour, leak acceptable with the 95th percentile at 17.4 L/m on a pressure of 8 cm.   I saw him on 05/23/2015 for a new problem of possible hydrocephalus. The patient is unaccompanied today. At the last visit he reported 2 recent episodes of vertigo. He has a long-standing history of vertigo of over 30 years duration. He reported staying active and drinking enough water. He was drinking one beer per day and reported smoking cessation and about 30 years prior. He was a heavy smoker in the past. We talked about his brain MRI with and without contrast from 05/02/2015: No imaging explanation for vestibular cochlear symptoms as above. Ventriculomegaly. Are there any symptoms of normal pressure hydrocephalus? In addition, I personally reviewed the images through the PACS system and I shared images with him on my computer. He was referred by his ENT doctor, Dr. Janace Hoard who follows him for his Mnire's disease and his vertigo. The patient reported that his memory was not as good. He reported that his older brother died at the age of 1 and was diagnosed with NPH and had a shunt placed. His brother had memory loss but also did not take care of himself very well per patient. The patient was compliant with CPAP treatment felt well with it.    I saw him on 12/25/2014 for follow-up of his OSA. He was compliant with CPAP therapy at the time.    I reviewed his CPAP compliance data from 04/22/2015 through 05/21/2015 which is a total of 30 days  during which time he used his machine 22 days with percent used days greater than 4 hours at 70%, indicating adequate compliance with an average usage of 4 hours and 20 minutes. Residual AHI borderline at 5.8 per hour, leaked low with the 95th percentile at 16.9 L/m on a pressure of 8 cm.     I saw him on 09/19/2014, at which time that he reported trying to be compliant with treatment. He did not do well with nasal pillows. He had trouble tolerating a chinstrap. He had issues with air leaking from the mask. Overall, however, he felt that he was sleeping better since treatment was started. He denied any significant restless leg symptoms. His balance had improved. For better tolerance of reduce his pressure to 8 cm from 9 cm.     I reviewed his CPAP compliance data from 11/24/2014 through 12/23/2014 which is a total of 30 days during which time he used his machine every night with percent used days greater than 4 hours at 97% indicating excellent compliance  with an average usage of 5 hours and 38 minutes, setting at 8 cm with EPR of 1. Residual AHI has increased to 7 per hour but mostly because of central apneas. When we look back at his CPAP titration study results from September 2015, he did have central apneas throughout the study on CPAP then but none with any desaturations. His leak fluctuates in the 95th percentile is at 22.2 L/m at this time. He's using a chinstrap every night. He still does not like it. He worries about his memory but has not had any recent decline. He has a family history of dementia and his older brother. He worries if he should be on a statin. He is on low-dose atorvastatin, 10 mg. He has primarily high triglycerides. He has an appointment with his primary care physician coming up for his yearly checkup. He has seen ENT for vertigo. He has had no recent issues. He does have hearing loss bilaterally.     I first met him on 02/14/2014 at the request of his primary care physician, at  which time the patient reported non-restorative sleep, snoring, daytime somnolence and nighttime awakenings, essentially since 1996 (after his prostate cancer surgery). I suggested he return for sleep study. He had a baseline sleep study on 04/18/2014 followed by a CPAP titration study on 06/18/2014 and went over his test results with him in detail today. His baseline sleep study showed a reduced sleep efficiency at 72.8% with a latency to sleep normal at 11 minutes and wake after sleep onset elevated at 92 minutes with mild sleep fragmentation noted. The arousal index was mildly elevated. He had an increased percentage of stage II sleep, 3.4% of slow-wave sleep, and REM sleep at 19.3% with a normal REM latency of 96.5 minutes. He had no significant PLMS or EKG changes. He had no significant EEG changes. He had mild intermittent snoring. Total AHI was 8.4 per hour, rising to 12.3 per hour during REM sleep and 15.3 per hour in the supine position. Average oxygen saturation was 95%, nadir was 81% in REM sleep. Based on his sleep related complaints I advised him to return for another sleep study for CPAP titration. His sleep efficiency during the second study was 72.8%. Latency to sleep was normal at 8 minutes and wake after sleep onset was elevated at 91.5 minutes with moderate sleep fragmentation noted. He had a mildly elevated arousal index. He had a normal percentage of light stage sleep, an increased percentage of deep sleep at 29.4% and a mildly reduced percentage of REM sleep at 16.5% with a normal REM latency. He had mild PLMS at 8.5 per hour with an associated arousal index of 4.5 per hour. He had no significant EKG or EEG changes. He was titrated on CPAP from 5-13 cm. AHI was 1.8 per hour at the pressure of 9. Supine REM sleep was achieved. He did indicate sleeping better with CPAP during that night.   I reviewed his CPAP compliance data from 08/14/2014 through 09/12/2014 which is a total of 30 days during  which time he used his machine 26 days. Percent used days greater than 4 hours was 83%, indicating fairly good compliance. Average usage was 4 hours and 31 minutes. Pressure at 9 cm. Leak was at times high with the 95th percentile at 27.5 L/m. Average AHI was suboptimal at 7.5 per hour. Looking at the breakdown of his AHI it appears that his central apnea index was elevated at 4.2 per hour.  He had seen Dr. Maxwell Caul for OSA before and had a home sleep test on 10/25/2013 which indicated no significant sleep disorder breathing with an AHI estimated at 3 per hour. Time below 89% saturation was 2 minutes. He reports a bedtime of 11:30 to 11:45 PM, and goes to sleep in minutes, with occasional OTC sleep aid, but not on a night to night basis. He wakes up 1-2 times per night, and sometimes his mind races and he feels it helps to turn his radio on for distraction. He goes to the bathroom 0-1 times per night. His wife's sleep used to disturb him, because of her snoring and restlessness, but they have been sleeping in separate bedrooms for years. He does not wake up with headaches. He snores, but did not used to snore. He has been able to lose weight and plays golf 2 times a week, goes to the gym 2 times per week. He likes fly fishing. He does not wake up rested. His rise time is between 5:30 to 6 AM. He rarely dreams. He is sleepy during the day, but does not take a nap on a scheduled basis. If he lies down in the afternoon, he will go to sleep. His ESS is 14/24 today. He has fallen asleep at the wheel x 2, both of which happened after a long day of fishing on a longer drive home. He has not had a MVA and no dozing off at the wheel in the last 3 years.   There is no report of nighttime reflux, with no nighttime cough experienced. The patient has not noted any RLS symptoms and is not known to kick while asleep or before falling asleep. There is no family history of RLS or OSA. Father was a heavy snorer, mother had some  sleep disruption.    He is not a very restless sleeper.    He denies cataplexy, sleep paralysis, hypnagogic or hypnopompic hallucinations, or sleep attacks. He does not report any vivid dreams, nightmares, dream enactments, or parasomnias, such as sleep talking or sleep walking. The patient has not had an attended sleep study.   He consumes 2 to 3 caffeinated beverages per day, usually in the form of coffee in the morning and ice tea with meals some time.   His bedroom is usually dark and cool. There is no TV in the bedroom and he uses a radio, with low volume.   He quit smoking in '86 and drinks 1 beer per day.    His Past Medical History Is Significant For: Past Medical History:  Diagnosis Date  . Actinic keratoses   . Diverticulosis    Mild  . Elevated fasting glucose    Mildly  . Herniated disc   . Junctional nevus   . Lipidemia    Mixed  . Mild sleep apnea   . Organic impotence   . Plantar fasciitis   . Prostate cancer [facility])     His Past Surgical History Is Significant For: Past Surgical History:  Procedure Laterality Date  . APPENDECTOMY    . COLONOSCOPY    . ELBOW SURGERY  2013  . KNEE ARTHROSCOPY  2013   knee and elbow  . PROSTATECTOMY  1986  . TONSILLECTOMY      His Family History Is Significant For: Family History  Problem Relation Age of Onset  . Stroke Mother   . Hypertension Mother   . Parkinson's disease Mother   . COPD Father   . Dementia Brother   .  Hydrocephalus Brother   . Parkinson's disease Brother   . Parkinson's disease Sister   . Colon cancer Neg Hx     His Social History Is Significant For: Social History   Social History  . Marital status: Married    Spouse name: Hassan Rowan  . Number of children: 2  . Years of education: 16   Occupational History  . cpa Retired    retired   Social History Main Topics  . Smoking status: Former Smoker    Packs/day: 2.00    Years: 24.00    Types: Cigarettes    Quit date: 10/11/1984  . Smokeless  tobacco: Former Systems developer  . Alcohol use 4.2 oz/week    7 Standard drinks or equivalent per week     Comment: 7 beers weekly  . Drug use: No  . Sexual activity: Not Asked   Other Topics Concern  . None   Social History Narrative   Patient lives at home with family.   Patient consumes 2-3 cups of caffeine daily,right handed.   2 children (1 deceased)    His Allergies Are:  Allergies  Allergen Reactions  . Percocet [Oxycodone-Acetaminophen] Other (See Comments)    Patient states he is unable to tolerate  :  His Current Medications Are:  Outpatient Encounter Prescriptions as of 05/18/2017  Medication Sig  . aspirin 81 MG tablet Take 81 mg by mouth daily.  Marland Kitchen atorvastatin (LIPITOR) 10 MG tablet Take 1 tablet by mouth daily.  . cholecalciferol (VITAMIN D) 1000 units tablet Take 2,000 Units by mouth daily.  Marland Kitchen co-enzyme Q-10 30 MG capsule Take 30 mg by mouth 2 (two) times daily.   . cyanocobalamin 1000 MCG tablet Take 1,000 mcg by mouth daily.  . Multiple Vitamin (MULTI-VITAMIN PO) Take by mouth daily.  . Omega-3 Fatty Acids (FISH OIL PO) Take by mouth daily.   No facility-administered encounter medications on file as of 05/18/2017.   :  Review of Systems:  Out of a complete 14 point review of systems, all are reviewed and negative with the exception of these symptoms as listed below: Review of Systems  Neurological:       Pt presents today to discuss his vertigo spells. Pt's cpap is going well.    Objective:  Neurological Exam  Physical Exam Physical Examination:   Orthostatic vital signs were benign, no significant drop, no significant lightheadedness reported, no actual vertigo spell reported with position changes.  General Examination: The patient is a very pleasant 76 y.o. male in no acute distress. He appears well-developed and well-nourished and well groomed.   HEENT: Normocephalic, atraumatic, pupils are equal, round and reactive to light and accommodation. He has  corrective eyeglasses. Extraocular tracking is good without limitation to gaze excursion or nystagmus noted. Normal smooth pursuit is noted. Hearing is mildly impaired. Face is symmetric with normal facial animation and normal facial sensation. Speech is clear with no dysarthria noted. There is no hypophonia. There is no lip, neck/head, jaw or voice tremor. Neck is supple with full range of passive and active motion. There are no carotid bruits on auscultation. Oropharynx exam reveals: no significant mouth dryness, adequate dental hygiene and moderate airway crowding. Mallampati is class II. Tongue protrudes centrally and palate elevates symmetrically. Tonsils are absent.   Chest: Clear to auscultation without wheezing, rhonchi or crackles noted.  Heart: S1+S2+0, regular and normal without murmurs, rubs or gallops noted.   Abdomen: Soft, non-tender and non-distended with normal bowel sounds appreciated on auscultation.  Extremities: There is no pitting edema in the distal lower extremities bilaterally. Pedal pulses are intact.  Skin: Warm and dry without trophic changes noted.  Musculoskeletal: exam reveals no obvious joint deformities, tenderness or joint swelling or erythema.   Neurologically:  Mental status: The patient is awake, alert and oriented in all 4 spheres. His immediate and remote memory, attention, language skills and fund of knowledge are appropriate. There is no evidence of aphasia, agnosia, apraxia or anomia. Speech is clear with normal prosody and enunciation. Thought process is linear. Mood is normal and affect is normal.   On 12/22/2016: MMSE: 30/30, CDT: 4/4, AFT 17/min.   Cranial nerves II - XII are as described above under HEENT exam. In addition: shoulder shrug is normal with equal shoulder height noted. Motor exam: Normal bulk, strength and tone is noted. There is no drift, tremor or rebound. Romberg is negative, except for minimal swaying. Reflexes are 1+  throughout. Fine motor skills and coordination: intact with normal finger taps, normal hand movements, normal rapid alternating patting, normal foot taps and normal foot agility.  Cerebellar testing: No dysmetria or intention tremor on finger to nose testing. Heel to shin is unremarkable bilaterally. There is no truncal or gait ataxia.  Sensory exam: intact to light touch in the upper and lower extremities.  Gait, station and balance: He stands easily. No veering to one side is noted. No leaning to one side is noted. Posture is age-appropriate and stance is narrow based. Gait shows normal stride length and normal pace. No problems turning are noted. Tandem walk is difficult for him (not a new problem), appears to be stable from before.  Assessment and Plan:  In summary, Akiva Brassfield is a very pleasant 76 year old male with an underlying medical history of hyperlipidemia, prostate cancer in 1996, colonic polyps, diverticulosis, plantar fasciitis, recurrent vertigo, cognitive complaints, intermittent paresthesias and obstructive sleep apnea, well established on CPAP therapy, who returns For a sooner than scheduled appointment because of recurrent bouts of vertigo recently. He feels like his balance is off still at this time although he does not actually have vertigo at this very moment. He tends to have a couple of bad spells of vertigo per year and this has been ongoing for years. Nevertheless, after his last bout of vertigo he feels that his balance has been more off and he does not feel he was able to go back to his baseline. He does report that it takes him several days to refer to his baseline function. He tries to hydrate well. He is very active. His physical exam is nonfocal, no orthostatic blood pressure drop, no one-sided or focal neurological findings and I reassured him. Nevertheless, I do recommend that we try to get him in with physical therapy for vestibular rehabilitation which can be quite  helpful.  He is encouraged to continue with CPAP therapy. He is commended for his compliance. He decided not to proceed with the neuropsychological testing although he did go through with the consultation appointment in March. He is wondering whether he should have gone through with the actual testing appointment as well. He is encouraged to think about it and call Dr. Corliss Blacker office. I will make a referral to physical therapy at [facility] rehabilitation. He is agreeable. He is encouraged to continue to stay well hydrated, well rested, and exercise on a regular basis. I will see him back routinely as planned. I answered all his questions today and he was in agreement.

## 2017-05-23 ENCOUNTER — Ambulatory Visit: Payer: PPO | Attending: Neurology | Admitting: Rehabilitative and Restorative Service Providers"

## 2017-05-23 DIAGNOSIS — R2681 Unsteadiness on feet: Secondary | ICD-10-CM

## 2017-05-23 DIAGNOSIS — R42 Dizziness and giddiness: Secondary | ICD-10-CM | POA: Insufficient documentation

## 2017-05-23 DIAGNOSIS — R2689 Other abnormalities of gait and mobility: Secondary | ICD-10-CM | POA: Insufficient documentation

## 2017-05-23 NOTE — Patient Instructions (Signed)
Gaze Stabilization: Sitting    Keeping eyes on target on wall 3 feet away or held in hand, and move head side to side for _30-60___ seconds. Repeat while moving head up and down for __30-60__ seconds. Do __2-3__ sessions per day.  Copyright  VHI. All rights reserved.   Gaze Stabilization: Tip Card  1.Target must remain in focus, not blurry, and appear stationary while head is in motion. 2.Perform exercises with small head movements (45 to either side of midline). 3.Increase speed of head motion so long as target is in focus. 4.If you wear eyeglasses, be sure you can see target through lens (therapist will give specific instructions for bifocal / progressive lenses). 5.These exercises may provoke dizziness or nausea. Work through these symptoms. If too dizzy, slow head movement slightly. Rest between each exercise. 6.Exercises demand concentration; avoid distractions.  Copyright  VHI. All rights reserved.    SINGLE LIMB STANCE    Stance: single leg on floor. Raise leg. Hold _10-15__ seconds. Repeat with other leg. __3 reps per session. 2 sessions per day. Copyright  VHI. All rights reserved.   Feet Partial Heel-Toe, Varied Arm Positions - Eyes Closed    Stand with right foot partially in front of the other and arms out. Close eyes and visualize upright position. Hold __30__ seconds. Repeat __3__ times per session. Do __2__ sessions per day.  Copyright  VHI. All rights reserved.

## 2017-05-24 NOTE — Therapy (Signed)
[facility] [address] [place], [place], [address] Phone: [phone]   Fax:  [phone]  Physical Therapy Evaluation  Patient Details  Name: Andrew Hayes MRN: [id number] Date of Birth: [birth year] Referring Provider: Star Age, MD  Encounter Date: 05/23/2017      PT End of Session - 05/23/17 1403    Visit Number 1   Number of Visits 6   Date for PT Re-Evaluation 07/07/17   Authorization Type G code every 10th visit   PT Start Time 1328   PT Stop Time 1415   PT Time Calculation (min) 47 min      Past Medical History:  Diagnosis Date  . Actinic keratoses   . Diverticulosis    Mild  . Elevated fasting glucose    Mildly  . Herniated disc   . Junctional nevus   . Lipidemia    Mixed  . Mild sleep apnea   . Organic impotence   . Plantar fasciitis   . Prostate cancer [facility])     Past Surgical History:  Procedure Laterality Date  . APPENDECTOMY    . COLONOSCOPY    . ELBOW SURGERY  2013  . KNEE ARTHROSCOPY  2013   knee and elbow  . PROSTATECTOMY  1986  . TONSILLECTOMY      There were no vitals filed for this visit.       Subjective Assessment - 05/23/17 1332    Subjective The patient reports initial onset of vertigo in 1986 with a worsening intesnity of spells.  The pattern is an intense episode of vertigo lasting 1-2 days noting he has to lay down 36-48 hours.  He gets nausea with vomitting during episodes.  If he is still, he gets no vertigo, however any movement provokes symptoms.  Then he enters "the hangover" stage noting a foggy, weak sensation x 7-10 days and then it clears up and goes back to baseline.  These episodes occur 2x/year x the past 6-7 years.  His last episode was in March, and then first week of June (lasted for <48 hours and was a less intense spell).  Patient denies vision changes, no hearing changes during spells, has hearing loss from lifestyle (power tools, etc), no  headaches, + lightheaded feeling.   Pertinent History notes being evaluated by ENT, MDs with no clear dx.    Patient Stated Goals "I don't even know if I'm at the right place."     Currently in Pain? No/denies            [facility] PT Assessment - 05/23/17 1338      Assessment   Medical Diagnosis vertigo   Referring Provider Star Age, MD   Onset Date/Surgical Date --  12/2016   Prior Therapy none     Precautions   Precautions None     Restrictions   Weight Bearing Restrictions No     Balance Screen   Has the patient fallen in the past 6 months No  notes that he falls occasionally during fly fishing   Has the patient had a decrease in activity level because of a fear of falling?  No   Is the patient reluctant to leave their home because of a fear of falling?  No     Home Environment   Living Environment Private residence   Type of Home House   Additional Comments --     Prior Function   Level of [place]  golf, fly fishing (notes unsteadiness during both leisure activities), works on [employer] at gym     Observation/Other Assessments   Focus on Therapeutic Outcomes (FOTO)  n/a     Coordination   Finger Nose Finger Test *Tested rapid alternating movements and FTN due to patient demo'ing a mild right hand tremor when demonstrating single leg stance activities.  RAM is WNLs and symmetrical.  With FTN, mild ataxia noted.     ROM / Strength   AROM / PROM / Strength AROM     AROM   Overall AROM Comments Note mild decrease in neck AROM for rotation and sidebending.      Balance   Balance Assessed Yes     High Level Balance   High Level Balance Comments Patient demonstrates hand tremors when trying to perform single leg stance.  He performs walking on heels and walking on toes WNLs.  Tandem gait with loss of balance.     Standardized Balance Assessment   Balance Master Testing Sensory Organization Test     Balance Master Testing    Results  Patient scores 74%, which is WNLs for age/height norms (normative value is 65%).  He has WNLs use of somatosensory, visual and vestibular inputs.             Vestibular Assessment - 05/23/17 1344      Vestibular Assessment   General Observation walks indep into clinic     Symptom Behavior   Type of Dizziness Lightheadedness  like right before you throw up   Frequency of Dizziness 2 times/year   Duration of Dizziness 2 days of severe symptoms (has to lay flat) followed by "hangover" feeling x 7-10 days.   Aggravating Factors Activity in general   Relieving Factors Head stationary;Lying supine     Occulomotor Exam   Occulomotor Alignment Normal   Spontaneous Absent   Gaze-induced Absent   Smooth Pursuits Intact   Saccades Intact     Vestibulo-Occular Reflex   VOR 1 Head Only (x 1 viewing) Intact x 5 reps without difficulties   Comment Head impulse test= not accurately able to perform due to neck guarding and unable to move at fast pace.     Visual Acuity   Dynamic Unable to accurately assess-- patient moves en bloc to guard against testing.     Positional Testing   Dix-Hallpike Dix-Hallpike Right;Dix-Hallpike Left   Horizontal Canal Testing Horizontal Canal Right;Horizontal Canal Left     Dix-Hallpike Right   Dix-Hallpike Right Duration none   Dix-Hallpike Right Symptoms No nystagmus     Dix-Hallpike Left   Dix-Hallpike Left Duration none   Dix-Hallpike Left Symptoms No nystagmus     Horizontal Canal Right   Horizontal Canal Right Duration none   Horizontal Canal Right Symptoms Normal     Horizontal Canal Left   Horizontal Canal Left Duration none   Horizontal Canal Left Symptoms Normal        Objective measurements completed on examination: See above findings.          [facility] Adult PT Treatment/Exercise - 05/23/17 1338      Neuro Re-ed    Neuro Re-ed Details  Instructed in gaze x 1 viewing, tandem stance in corner, and single leg stance activities.                 PT Education - 05/23/17 2226    Education provided Yes   Education Details HEP: gaze x 1 viewing, tandem stance, single limb stance  Person(s) Educated Patient   Methods Explanation;Demonstration;Handout   Comprehension Verbalized understanding;Returned demonstration             PT Long Term Goals - 05/23/17 2227      PT LONG TERM GOAL #1   Title The patient will return demo HEP for high level balance, neck flexibility, and gaze activities.   Time 6   Period Weeks   Target Date 07/07/17     PT LONG TERM GOAL #2   Title The patient will maintain single leg stance x 10 seconds bilaterally to demo improved narrow base of support control.   Time 6   Period Weeks   Target Date 07/07/17     PT LONG TERM GOAL #3   Title The patient will verbalize understanding of mgmt of episodic vertigo.   Time 6   Period Weeks   Target Date 07/07/17     PT LONG TERM GOAL #4   Title The patient will demo gaze x 1 viewing x 30 seconds with isolated neck from trunk movement.   Time 6   Period Weeks   Target Date 07/07/17                Plan - 05/24/17 0736    Clinical Impression Statement The patient is a 76 year old male presenting to OP therapy with h/o episodic vertigo that he notes is increasing in intensity and also having longer term effects on balance.  His "attacks" of vertigo are debilitating x 2 days (in bed with n/v upon rising) and then he has lasting effects x 7-10 days.  Attacks of vertigo lasting hours to days would most likely match a Meniere's or a migraine diagnosis however he does not experience any tinnitus, aural fullness, HA, light sensitivity, noise sensitivity, etc.  The patient leads an active lifestyle and appears to return to a baseline level of balance/mobility in between attacks of vertigo.  At today's evaluation, he has guarded neck posturing hindering assessment of VOR, dec'd high level balance/narrow base of support standing. PT to  address deficitis to promote self mgmt of recurring symptoms and maintain active lifestyle.     Clinical Presentation Stable   Clinical Decision Making Low   Rehab Potential Good   PT Frequency 1x / week   PT Duration 6 weeks   PT Treatment/Interventions ADLs/Self Care Home Management;Therapeutic activities;Therapeutic exercise;Stair training;Gait training;Neuromuscular re-education;Patient/family education;Vestibular;Canalith Repostioning;Manual techniques   PT Next Visit Plan Check HEP, add tandem gait, dynamic gait activities with head motion, neck manual/ stretching, review VOR/gaze x 1.   Consulted and Agree with Plan of Care Patient      Patient will benefit from skilled therapeutic intervention in order to improve the following deficits and impairments:  Abnormal gait, Decreased balance, Hypomobility, Dizziness  Visit Diagnosis: Other abnormalities of gait and mobility  Unsteadiness on feet  Dizziness and giddiness     Problem List Patient Active Problem List   Diagnosis Date Noted  . Dysphagia 06/09/2015  . History of colonic polyps 06/09/2015  . Dizzy spells 03/08/2012  . Hyperlipidemia 03/08/2012    Andrew Hayes, PT 05/24/2017, 9:25 AM  [facility] [address] [place], [place], [address] Phone: [phone]   Fax:  [phone]  Name: Andrew Hayes MRN: [id number] Date of Birth: 1940/12/01

## 2017-06-10 ENCOUNTER — Ambulatory Visit: Payer: PPO | Admitting: Rehabilitative and Restorative Service Providers"

## 2017-06-10 DIAGNOSIS — R42 Dizziness and giddiness: Secondary | ICD-10-CM

## 2017-06-10 DIAGNOSIS — R2689 Other abnormalities of gait and mobility: Secondary | ICD-10-CM | POA: Diagnosis not present

## 2017-06-10 DIAGNOSIS — R2681 Unsteadiness on feet: Secondary | ICD-10-CM

## 2017-06-10 NOTE — Patient Instructions (Signed)
Gaze Stabilization - Tip Card  1.Target must remain in focus, not blurry, and appear stationary while head is in motion. 2.Perform exercises with small head movements (45 to either side of midline). 3.Increase speed of head motion so long as target is in focus. 4.If you wear eyeglasses, be sure you can see target through lens (therapist will give specific instructions for bifocal / progressive lenses). 5.These exercises may provoke dizziness or nausea. Work through these symptoms. If too dizzy, slow head movement slightly. Rest between each exercise. 6.Exercises demand concentration; avoid distractions. 7.For safety, perform standing exercises close to a counter, wall, corner, or next to someone.  Copyright  VHI. All rights reserved.   Gaze Stabilization - Standing Feet Apart   Feet shoulder width apart, keeping eyes on target on wall 3 feet away, tilt head down slightly and move head side to side for 30 seconds. o 2-3 sessions per day.   Copyright  VHI. All rights reserved.    Single Leg (Compliant Surface) - Eyes Open    Stand on compliant surface: ___pillow or foam at gym_____ holding support. Lift right leg while maintaining balance over other leg. Progress to removing hands from support surface for longer periods of time. Hold__10-15__ seconds. Repeat __3__ times per session. Do __1__ sessions per day.  Copyright  VHI. All rights reserved.     Feet Partial Heel-Toe, Varied Arm Positions - Eyes Closed    Stand with right foot partially in front of the other and arms out. Close eyes and visualize upright position. Hold __30__ seconds. Repeat __3__ times per session. Do __2__ sessions per day.  Copyright  VHI. All rights reserved.   Feet Apart (Compliant Surface) Varied Arm Positions - Eyes Closed    Stand on compliant surface: __pillow  Or foam ______ with feet shoulder width apart and arms out. Close eyes and visualize upright position. Hold__30__  seconds. Repeat __3__ times per session. Do _1___ sessions per day.  Copyright  VHI. All rights reserved.   Levator Stretch    Grasp seat or sit on hand on side to be stretched. Turn head toward other side and look down. *You don't need to pull with your hand.  Hold 10 seconds, repeat 3 times to each side 1-2 times/day. http://gt2.exer.us/30   Copyright  VHI. All rights reserved.

## 2017-06-10 NOTE — Therapy (Signed)
[facility] [address] [place], [place], [address] Phone: [phone]   Fax:  [phone]  Physical Therapy Treatment  Patient Details  Name: Andrew Hayes MRN: [id number] Date of Birth: 08-Jan-1941 Referring Provider: Star Age, MD  Encounter Date: 06/10/2017      PT End of Session - 06/10/17 0927    Visit Number 2   Number of Visits 6   Date for PT Re-Evaluation 07/07/17   Authorization Type G code every 10th visit   PT Start Time 0848   PT Stop Time 0930   PT Time Calculation (min) 42 min      Past Medical History:  Diagnosis Date  . Actinic keratoses   . Diverticulosis    Mild  . Elevated fasting glucose    Mildly  . Herniated disc   . Junctional nevus   . Lipidemia    Mixed  . Mild sleep apnea   . Organic impotence   . Plantar fasciitis   . Prostate cancer [facility])     Past Surgical History:  Procedure Laterality Date  . APPENDECTOMY    . COLONOSCOPY    . ELBOW SURGERY  2013  . KNEE ARTHROSCOPY  2013   knee and elbow  . PROSTATECTOMY  1986  . TONSILLECTOMY      There were no vitals filed for this visit.      Subjective Assessment - 06/10/17 0848    Subjective The patient reports no spells of dizziness and notes that the fog lifted 3-4 days after his initial evaluation.  He still notes his balance is not as good as it was before the bad spell in June.  He notes this especially when fly fishing.     Pertinent History notes being evaluated by ENT, MDs with no clear dx.    Patient Stated Goals "I don't even know if I'm at the right place."     Currently in Pain? No/denies            [facility] PT Assessment - 06/10/17 0917      AROM   Overall AROM Comments Cervical ROM:  34 degrees extension, 35 degrees flexion, 20 degrees right sidebend, 8 degrees left sidebend, 70 degrees right rotation and 58 degrees left rotation.  Improved after manual to 72 degrees bilateral rotation, 10 degrees  L sidebend, 30 degrees R sidebending.                     [facility] Adult PT Treatment/Exercise - 06/10/17 0927      Self-Care   Self-Care Other Self-Care Comments   Other Self-Care Comments  Discussed incorporating balance activities into gym routine and how each activity addresses functional complaints.     Neuro Re-ed    Neuro Re-ed Details  Standing on compliant surfaces performing feet apart progressing to feet together with eyes closed, with head motion + eyes open, and while performing single limb stance activities.     Exercises   Exercises Neck     Neck Exercises: Seated   Other Seated Exercise Levator scapulae stretch.      Manual Therapy   Manual Therapy Joint mobilization;Soft tissue mobilization   Joint Mobilization Lateral glides mid cervical spine r>l and l>r; end range down glides right side grade II and III and to left side grade II and III to improve sidebending.     Soft tissue mobilization Upper trapezius, right scalenes and suboccipital soft tissue mobilization with passive overpressure  into cervical flexion for stretch.                  PT Education - 06/10/17 1220    Education provided Yes   Education Details Modified HEP adding compliant surface balance activities + levator stretch   Person(s) Educated Patient   Methods Explanation;Demonstration;Handout   Comprehension Verbalized understanding;Returned demonstration             PT Long Term Goals - 05/23/17 2227      PT LONG TERM GOAL #1   Title The patient will return demo HEP for high level balance, neck flexibility, and gaze activities.   Time 6   Period Weeks   Target Date 07/07/17     PT LONG TERM GOAL #2   Title The patient will maintain single leg stance x 10 seconds bilaterally to demo improved narrow base of support control.   Time 6   Period Weeks   Target Date 07/07/17     PT LONG TERM GOAL #3   Title The patient will verbalize understanding of mgmt of episodic  vertigo.   Time 6   Period Weeks   Target Date 07/07/17     PT LONG TERM GOAL #4   Title The patient will demo gaze x 1 viewing x 30 seconds with isolated neck from trunk movement.   Time 6   Period Weeks   Target Date 07/07/17               Plan - 06/10/17 1613    Clinical Impression Statement The patient notes "foggy" sensation has cleared, but imbalance still remains.  PT provided high level balance activities to add to HEP and to incorporate into gym routine.  Plan to f/u in 2-3 weeks to determine how progress is going with functional balance.    PT Treatment/Interventions ADLs/Self Care Home Management;Therapeutic activities;Therapeutic exercise;Stair training;Gait training;Neuromuscular re-education;Patient/family education;Vestibular;Canalith Repostioning;Manual techniques   PT Next Visit Plan Check HEP, check LTGs, manual on neck/ stretching, review gaze   Consulted and Agree with Plan of Care Patient      Patient will benefit from skilled therapeutic intervention in order to improve the following deficits and impairments:  Abnormal gait, Decreased balance, Hypomobility, Dizziness  Visit Diagnosis: Other abnormalities of gait and mobility  Unsteadiness on feet  Dizziness and giddiness     Problem List Patient Active Problem List   Diagnosis Date Noted  . Dysphagia 06/09/2015  . History of colonic polyps 06/09/2015  . Dizzy spells 03/08/2012  . Hyperlipidemia 03/08/2012    Bresha Hosack, PT 06/10/2017, 4:14 PM  [facility] [address] [place], [place], [address] Phone: [phone]   Fax:  [phone]  Name: Andrew Hayes MRN: [id number] Date of Birth: 08/03/1941

## 2017-06-29 ENCOUNTER — Ambulatory Visit: Payer: PPO | Attending: Neurology | Admitting: Rehabilitative and Restorative Service Providers"

## 2017-06-29 DIAGNOSIS — R42 Dizziness and giddiness: Secondary | ICD-10-CM

## 2017-06-29 DIAGNOSIS — R2681 Unsteadiness on feet: Secondary | ICD-10-CM | POA: Diagnosis not present

## 2017-06-29 DIAGNOSIS — R2689 Other abnormalities of gait and mobility: Secondary | ICD-10-CM | POA: Diagnosis not present

## 2017-06-29 NOTE — Therapy (Signed)
[facility] [address] [place], [place], [address] Phone: [phone]   Fax:  [phone]  Physical Therapy Treatment and Discharge Summary  Patient Details  Name: Andrew Hayes MRN: [id number] Date of Birth: [birth year] Referring Provider: Star Age, MD  Encounter Date: 06/29/2017      PT End of Session - 06/29/17 1024    Visit Number 3   Number of Visits 6   Date for PT Re-Evaluation 07/07/17   Authorization Type G code every 10th visit   PT Start Time 1020   PT Stop Time 1100   PT Time Calculation (min) 40 min      Past Medical History:  Diagnosis Date  . Actinic keratoses   . Diverticulosis    Mild  . Elevated fasting glucose    Mildly  . Herniated disc   . Junctional nevus   . Lipidemia    Mixed  . Mild sleep apnea   . Organic impotence   . Plantar fasciitis   . Prostate cancer [facility])     Past Surgical History:  Procedure Laterality Date  . APPENDECTOMY    . COLONOSCOPY    . ELBOW SURGERY  2013  . KNEE ARTHROSCOPY  2013   knee and elbow  . PROSTATECTOMY  1986  . TONSILLECTOMY      There were no vitals filed for this visit.      Subjective Assessment - 06/29/17 1023    Subjective The patient reports the "foggy" sensation has cleared and he was able to go trout fishing feeling "fairly close to normal".   He reports no unsteadiness when moving around house.  The patient notes movement is easier with neck and posture.   Patient Stated Goals "I don't even know if I'm at the right place."     Currently in Pain? No/denies                         [facility] Adult PT Treatment/Exercise - 06/29/17 1033      Self-Care   Self-Care Other Self-Care Comments   Other Self-Care Comments  FISHING:  Patient has returned to recreational trout fishing and notes improving balance.  GOLF:  The patient notes he was having difficulty with balance duirng golf swing and we discussed using  visual fixation for balance (spotting at end of swing) to improve balance.  Patient notes he had gotten frustrated and quit playing recently.  PT encouraged him to go to hitting range to practice swing with goal of returning to golf as recreational activity.  REGARDING DIAGNOSIS:  Patient inquired and asked many questions re: BPPV.  PT discussed my role in care is to treat impairments found on clinical examination to optimize functional level.  We do not provide a medical diagnosis or order testing for diagnostics.  At initial evaluation, he did not have positive testing for positional vertigo and his subjective onset is more consistent with a vestibular nerve issue.  PT discussed further f/u with MD if future episodes of vertigo occur.      Neuro Re-ed    Neuro Re-ed Details  Single leg stance x 10 seconds on each side with tremoring noted during performance, patient demonstrated narrowing base of support with eyes closed performing partial heel/toe.  PT discussed progression of HEP recommending narrowing base to tandem stance as he progresses with activities.  Performed standing gaze x 1 adaptation x 30 seconds with improved ability to isolate  head movement from trunk.  Eye/hand coordination activities performing tennis ball toss during gait and adding marching for greater balance challenge.                  PT Education - 06/29/17 1404    Education provided Yes   Education Details PT answered patient's questions regarding diagnosis (see self care), progression of home exercises, gym activities, and recreational sports.   Person(s) Educated Patient   Methods Explanation   Comprehension Verbalized understanding             PT Long Term Goals - 06/29/17 1024      PT LONG TERM GOAL #1   Title The patient will return demo HEP for high level balance, neck flexibility, and gaze activities.   Baseline Met on 06/29/17   Time 6   Period Weeks   Status Achieved     PT LONG TERM GOAL #2    Title The patient will maintain single leg stance x 10 seconds bilaterally to demo improved narrow base of support control.   Baseline Able to perform, however subjectively notes difficulty and has general shakiness t/o when attempting.   Time 6   Period Weeks   Status Achieved     PT LONG TERM GOAL #3   Title The patient will verbalize understanding of mgmt of episodic vertigo.   Baseline Discussed maintaining HEP handouts and returning to gaze x 1 adaptation exercises if any future episodes of dizziness occur.   Time 6   Period Weeks   Status Achieved     PT LONG TERM GOAL #4   Title The patient will demo gaze x 1 viewing x 30 seconds with isolated neck from trunk movement.   Baseline met on 06/29/17   Time 6   Period Weeks   Status Achieved               Plan - 06/29/17 1411    Clinical Impression Statement The patient met LTGs.  He has a history of episodic spells of vertigo that come on in late afternoon and last 2-3 days followed by 7-10 days of "foggy" sensation.  Symptoms have improved with patient feeling closer to baseline level with exception of not returning to golf due to imbalance at end of swing.  PT discussed plan to practice post d/c from therapy.                                                                                        Patient does have a general shakiness during high level balance tasks, that appears to be a mild coordination issue versus true tremor.  It is unclear if this is related to exertion or concern for falling.     PT Treatment/Interventions ADLs/Self Care Home Management;Therapeutic activities;Therapeutic exercise;Stair training;Gait training;Neuromuscular re-education;Patient/family education;Vestibular;Canalith Repostioning;Manual techniques   PT Next Visit Plan discharge today   Consulted and Agree with Plan of Care Patient      Patient will benefit from skilled therapeutic intervention in order to improve the following deficits and  impairments:  Abnormal gait, Decreased balance, Hypomobility, Dizziness  Visit Diagnosis: Other abnormalities of gait and  mobility  Unsteadiness on feet  Dizziness and giddiness       G-Codes - 07-08-2017 1411    Functional Assessment Tool Used (Outpatient Only) Episodic dizziness   Functional Limitation Self care   Self Care Goal Status (I3254) At least 1 percent but less than 20 percent impaired, limited or restricted   Self Care Discharge Status [phone]) At least 1 percent but less than 20 percent impaired, limited or restricted      PHYSICAL THERAPY DISCHARGE SUMMARY  Visits from Start of Care: 3  Current functional level related to goals / functional outcomes: See above   Remaining deficits: Dec'd high level balance (single limb stance) Dec'd return to golfing activities   Education / Equipment: Home program, gym routine, return to recreational activities.  Plan: Patient agrees to discharge.  Patient goals were met. Patient is being discharged due to meeting the stated rehab goals.  ?????        Thank you for the referral of this patient. Andrew Hayes, MPT   Andrew Hayes 08-Jul-2017, 2:15 PM  [facility] [address] [place], [place], [address] Phone: [phone]   Fax:  [phone]  Name: Andrew Hayes MRN: [id number] Date of Birth: [birth year]

## 2017-07-17 IMAGING — MR MR HEAD WO/W CM
9 of 12 series · 32 of 48 positions shown · IV contrast (multihance)
Comparison: None.

CLINICAL DATA: Active Meniere's disease, cochlear and vestibular,
hearing loss. Patient has had symptoms on and off for 30 years.

EXAM:
MRI HEAD WITHOUT AND WITH CONTRAST
TECHNIQUE: Multiplanar, multiecho pulse sequences of the brain and surrounding
structures were obtained without and with intravenous contrast.
BUN and creatinine were obtained on site at [HOSPITAL] at
[HOSPITAL].
Results:  BUN 24 mg/dL,  Creatinine 1.1 mg/dL.
CONTRAST:  17 cc MultiHance intravenous

[Series 2: T1 · sagittal · 5.0mm · 0.45mm/px · 1 of 19 slices shown]
[im 1/19]
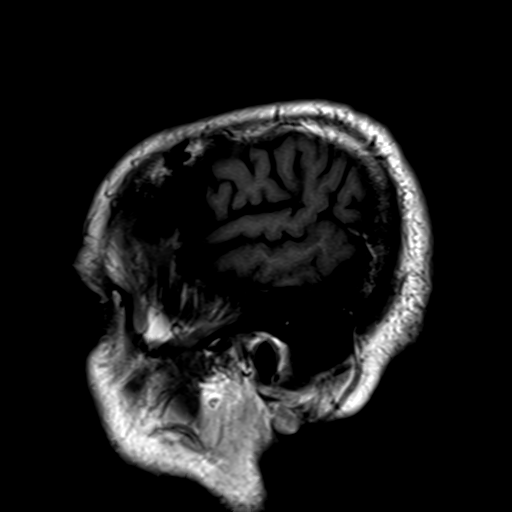

[Series 3: ep2d_diff_(id)_trace · axial · 3.0mm · 1.80mm/px · z∈[-66,+80]mm · 8 of 98 slices shown]
[im 1/98]
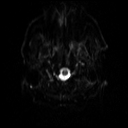
[im 11/98]
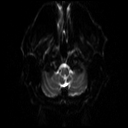
[im 33/98]
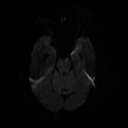
[im 44/98]
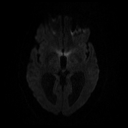
[im 54/98]
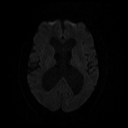
[im 65/98]
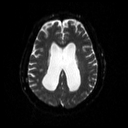
[im 87/98]
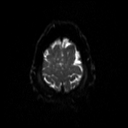
[im 98/98]
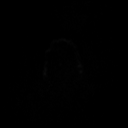

[Series 4: ep2d_diff_(id)_trace_adc · axial · 3.0mm · 1.80mm/px · z∈[-66,+80]mm · 6 of 50 slices shown]
[im 1/50]
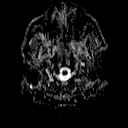
[im 10/50]
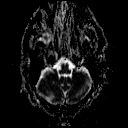
[im 20/50]
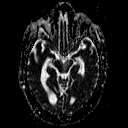
[im 30/50]
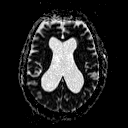
[im 40/50]
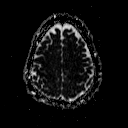
[im 50/50]
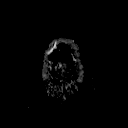

[Series 5: T2 · axial · 5.0mm · 0.45mm/px · z∈[-61,+81]mm · 3 of 23 slices shown (1 of 2)]
[im 1/23]
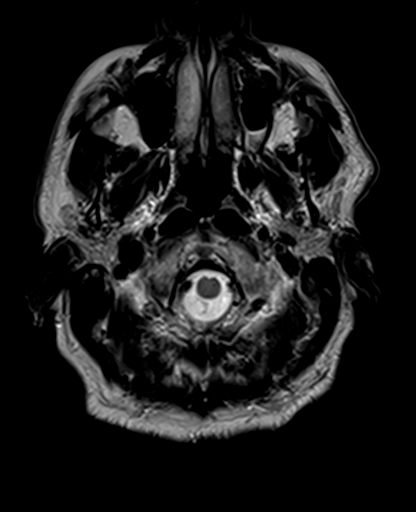
[im 12/23]
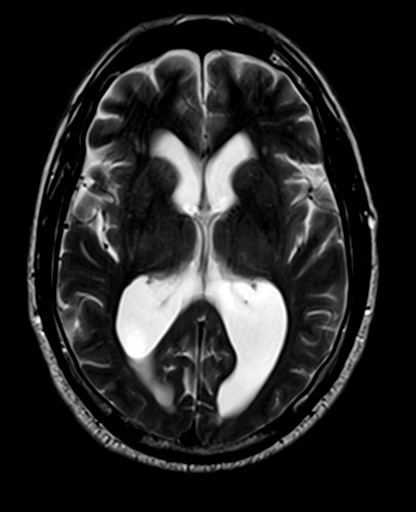
[im 23/23]
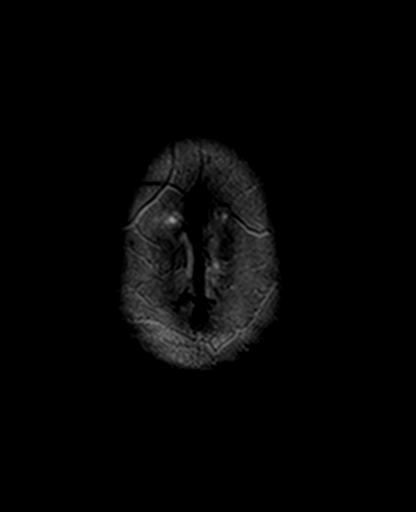

[Series 6: FLAIR · axial · 5.0mm · 0.45mm/px · z∈[-62,+81]mm · 3 of 23 slices shown]
[im 1/23]
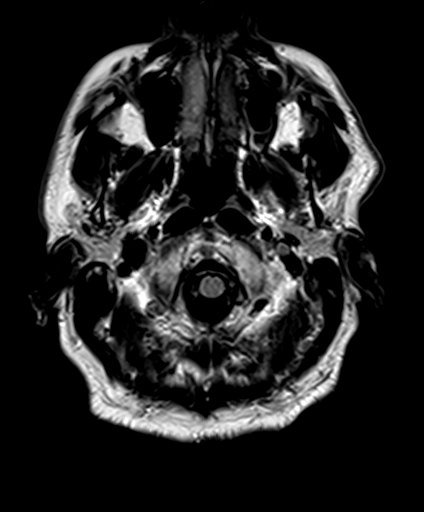
[im 12/23]
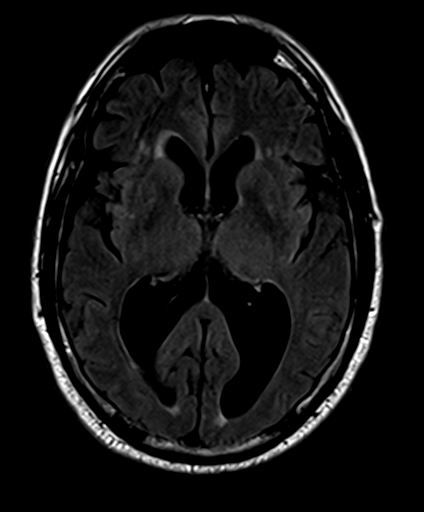
[im 23/23]
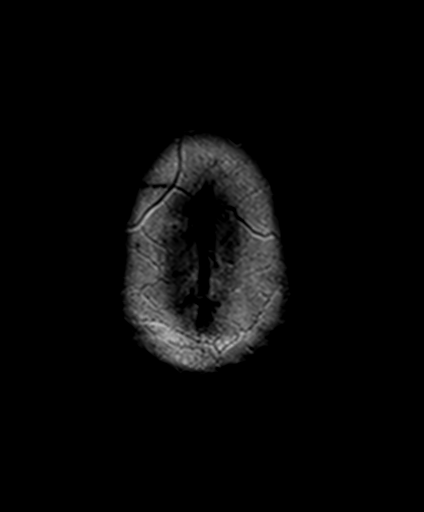

[Series 7: cor 3mm · coronal · 3.0mm · 0.56mm/px · 2 of 18 slices shown]
[im 1/18]
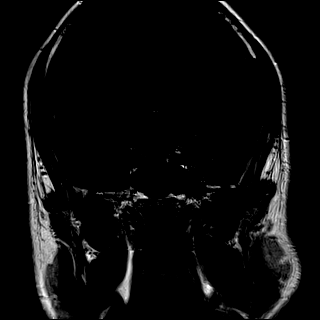
[im 18/18]
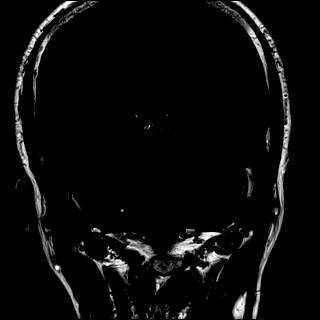

[Series 8: axial 3mm · axial · 3.0mm · 0.56mm/px · z∈[-67,-11]mm · 2 of 18 slices shown]
[im 1/18]
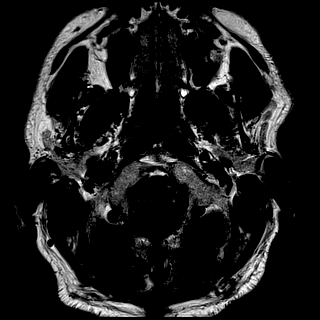
[im 18/18]
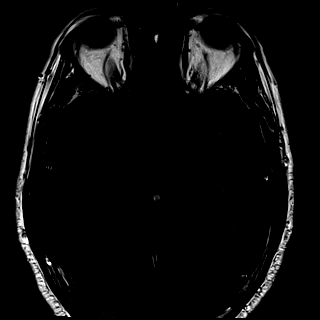

[Series 9: bSSFP · axial · 0.7mm · 0.23mm/px · z∈[-51,-29]mm · 4 of 44 slices shown]
[im 1/44]
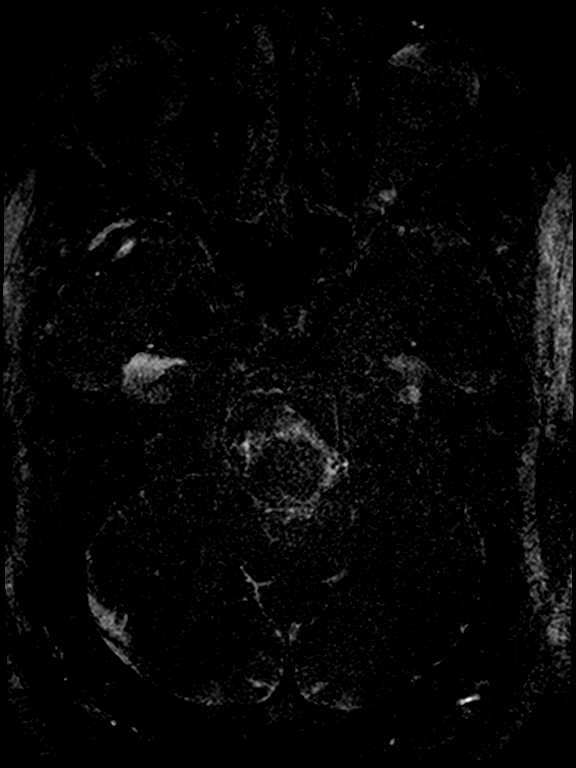
[im 11/44]
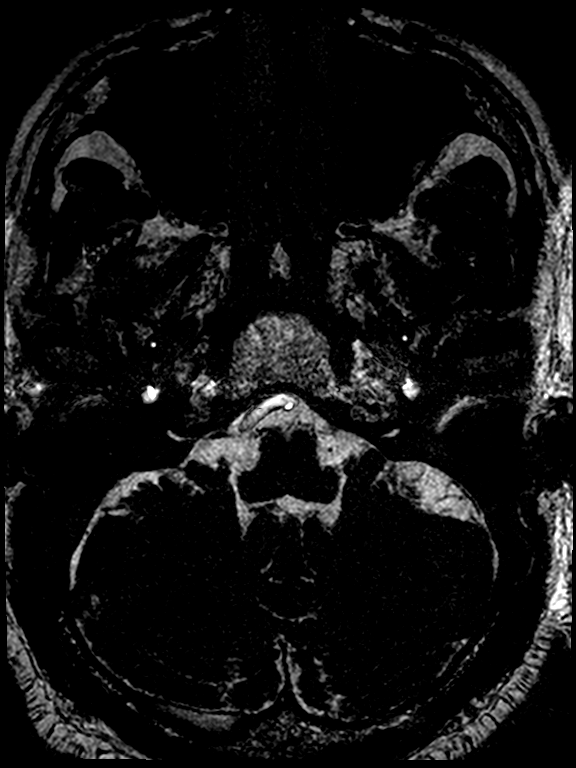
[im 22/44]
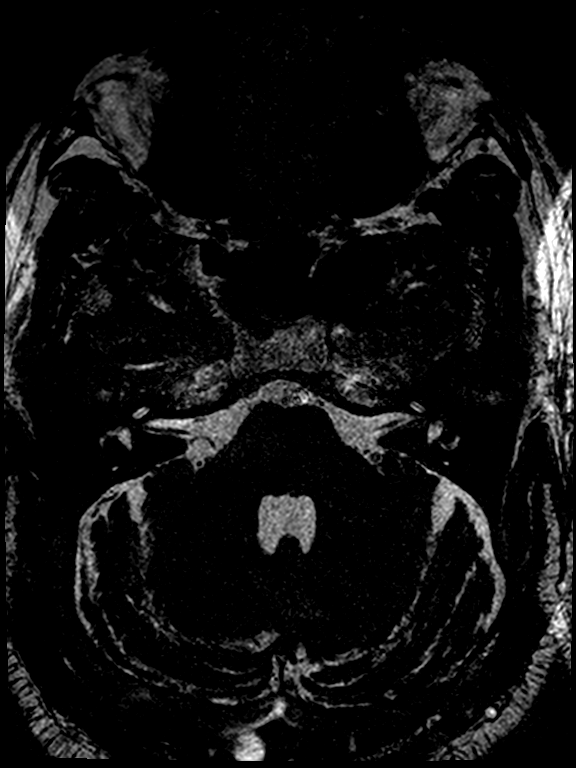
[im 33/44]
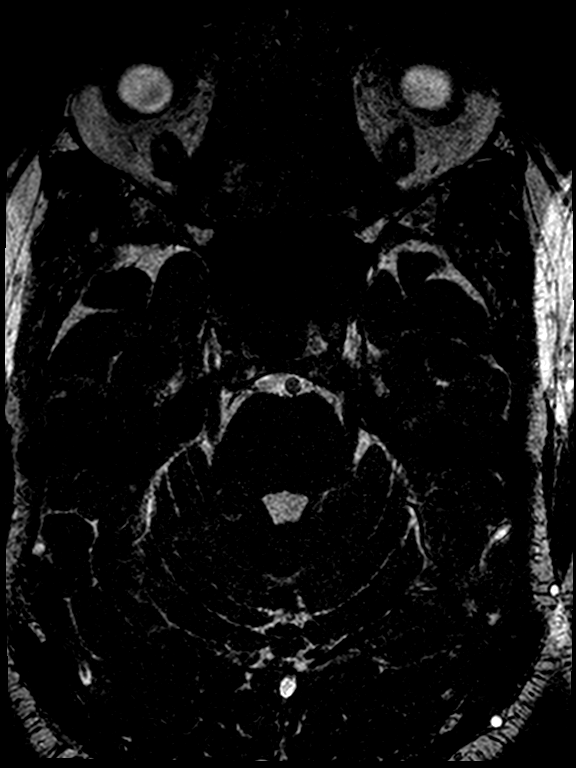

[Series 10: T2 · coronal · 5.0mm · 0.45mm/px · 3 of 26 slices shown (2 of 2)]
[im 1/26]
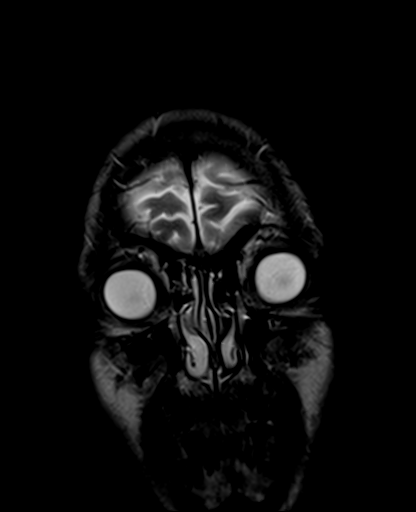
[im 13/26]
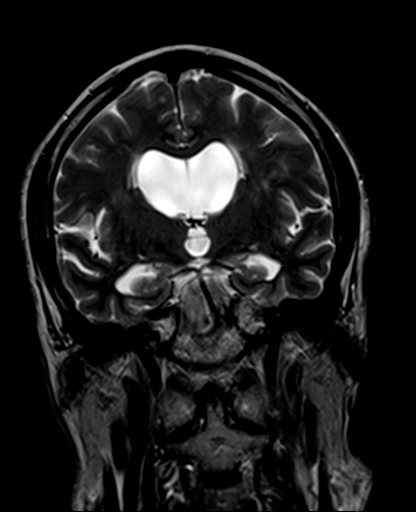
[im 26/26]
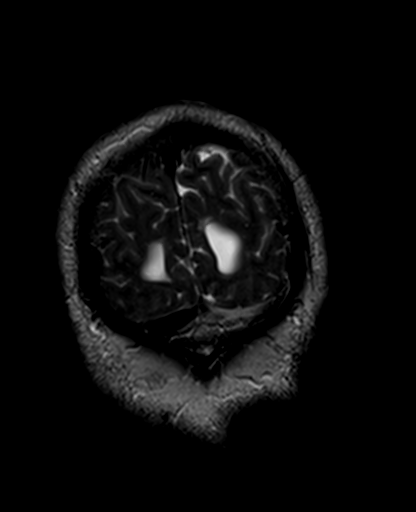

[32 of 48 positions shown; findings below may reference images not displayed]

FINDINGS: Calvarium and upper cervical spine: No focal marrow signal
abnormality.

Cysts in the nasopharynx are likely mucous retention cysts given
they are slightly off midline.

Orbits: No significant findings.

Sinuses and Mastoids: Clear. Mastoid and middle ears are clear.

Brain:

No labyrinthine signal/enhancement abnormality or alternate
explanation for patient's vestibular and cochlear symptoms.
Specifically, there is no evidence of vestibular cochlear
schwannoma, neuritis or brainstem lesion. The vestibular aqueduct is
not well seen on high-resolution series, nonspecific but associated
with Meniere's.

Lateral and third ventriculomegaly. Dilatation is especially
prominent in the atria, with mild scalloping. No notable
hyperdynamic CSF flow through the cerebral aqueduct. No
transependymal CSF flow suspected.

Patchy T2 and FLAIR hyperintense foci in the bilateral cerebral
white matter compatible with chronic small vessel disease.

No acute abnormality such as acute infarct, hemorrhage, obstructive
hydrocephalus, or mass lesion. No evidence of large vessel
occlusion.

Incidental developmental venous anomaly in the inferior right
cerebellum.
IMPRESSION: 1. No imaging explanation for vestibular cochlear symptoms, as
above.
2. Ventriculomegaly. Are there any symptoms of normal pressure
hydrocephalus?

## 2017-07-21 ENCOUNTER — Ambulatory Visit (INDEPENDENT_AMBULATORY_CARE_PROVIDER_SITE_OTHER): Payer: PPO

## 2017-07-21 ENCOUNTER — Encounter (INDEPENDENT_AMBULATORY_CARE_PROVIDER_SITE_OTHER): Payer: Self-pay | Admitting: Surgery

## 2017-07-21 ENCOUNTER — Ambulatory Visit (INDEPENDENT_AMBULATORY_CARE_PROVIDER_SITE_OTHER): Payer: PPO | Admitting: Surgery

## 2017-07-21 VITALS — BP 131/77 | HR 59 | Ht 71.0 in | Wt 185.0 lb

## 2017-07-21 DIAGNOSIS — M25512 Pain in left shoulder: Secondary | ICD-10-CM

## 2017-07-21 DIAGNOSIS — M7542 Impingement syndrome of left shoulder: Secondary | ICD-10-CM

## 2017-07-21 DIAGNOSIS — G8929 Other chronic pain: Secondary | ICD-10-CM | POA: Diagnosis not present

## 2017-07-21 NOTE — Progress Notes (Signed)
Office Visit Note   Patient: Andrew Hayes           Date of Birth: [birth year]           MRN: [id number] Visit Date: 07/21/2017              Requested by: [name], MD [place], [place] [address] PCP: [name], MD   Assessment & Plan: Visit Diagnoses:  1. Chronic left shoulder pain     Plan: Due to patient's ongoing pain and temporary response with previous subacromial Marcaine/Depo-Medrol injection I we'll schedule MRI to rule out rotator cuff pathology. Follow Dr. Lorin Mercy after completion to discuss results and further treatment options.  Follow-Up Instructions: No Follow-up on file.   Orders:  No orders of the defined types were placed in this encounter.  No orders of the defined types were placed in this encounter.     Procedures: No procedures performed   Clinical Data: No additional findings.   Subjective: Chief Complaint  Patient presents with  . Left Shoulder - Pain    HPI Patient comes in today with complaints of left shoulder pain. Last seen by Dr. Silvano Bilis 2018 and subacromial Marcaine/Depo-Medrol injection. States that this helped for few months. Currently bothered with overhead activity and reaching behind his back. Patient is an avid golfer and has pain with certain swings. He also is very active with going to the gym and shoulders bothered with overhead lifting.  No cervical spinal radicular component. Pain does bother him at night when he lies on his left side. Review of Systems No current cardiopulmonary GI GU issues.   Objective: Vital Signs: BP 131/77   Pulse (!) 59   Ht 5\' 11"  (1.803 m)   Wt 185 lb (83.9 kg)   BMI 25.80 kg/m   Physical Exam  Constitutional: He is oriented to person, place, and time. No distress.  HENT:  Head: Normocephalic and atraumatic.  Eyes: Pupils are equal, round, and reactive to light. EOM are normal.  Neck: Normal range of motion.  Pulmonary/Chest: No respiratory distress.    Abdominal: He exhibits no distension.  Musculoskeletal:  Gait is normal. Service spine unremarkable. Left shoulder he does have fairly good range of motion but with discomfort on overhead. Positive impingement test. Pain and weakness with supraspinatus resistance. Negative drop arm test. Does have some trace weakness with resisted external rotation. Neurovascular intact.   Neurological: He is alert and oriented to person, place, and time.  Skin: Skin is warm and dry.  Psychiatric: He has a normal mood and affect.    Ortho Exam  Specialty Comments:  No specialty comments available.  Imaging: No results found.   PMFS History: Patient Active Problem List   Diagnosis Date Noted  . Chronic left shoulder pain 07/21/2017  . Dysphagia 06/09/2015  . History of colonic polyps 06/09/2015  . Dizzy spells 03/08/2012  . Hyperlipidemia 03/08/2012   Past Medical History:  Diagnosis Date  . Actinic keratoses   . Diverticulosis    Mild  . Elevated fasting glucose    Mildly  . Herniated disc   . Junctional nevus   . Lipidemia    Mixed  . Mild sleep apnea   . Organic impotence   . Plantar fasciitis   . Prostate cancer [facility])     Family History  Problem Relation Age of Onset  . Stroke Mother   . Hypertension Mother   . Parkinson's disease Mother   .  COPD Father   . Dementia Brother   . Hydrocephalus Brother   . Parkinson's disease Brother   . Parkinson's disease Sister   . Colon cancer Neg Hx     Past Surgical History:  Procedure Laterality Date  . APPENDECTOMY    . COLONOSCOPY    . ELBOW SURGERY  2013  . KNEE ARTHROSCOPY  2013   knee and elbow  . PROSTATECTOMY  1986  . TONSILLECTOMY     Social History   Occupational History  . cpa Retired    retired   Social History Main Topics  . Smoking status: Former Smoker    Packs/day: 2.00    Years: 24.00    Types: Cigarettes    Quit date: 10/11/1984  . Smokeless tobacco: Former Systems developer  . Alcohol use 4.2 oz/week    7  Standard drinks or equivalent per week     Comment: 7 beers weekly  . Drug use: No  . Sexual activity: Not on file

## 2017-08-06 ENCOUNTER — Ambulatory Visit
Admission: RE | Admit: 2017-08-06 | Discharge: 2017-08-06 | Disposition: A | Discharge status: Home / Self Care | Payer: PPO | Source: Ambulatory Visit | Attending: Surgery | Admitting: Surgery

## 2017-08-06 DIAGNOSIS — M25512 Pain in left shoulder: Principal | ICD-10-CM

## 2017-08-06 DIAGNOSIS — M19012 Primary osteoarthritis, left shoulder: Secondary | ICD-10-CM | POA: Diagnosis not present

## 2017-08-06 DIAGNOSIS — G8929 Other chronic pain: Secondary | ICD-10-CM

## 2017-08-10 ENCOUNTER — Ambulatory Visit (INDEPENDENT_AMBULATORY_CARE_PROVIDER_SITE_OTHER): Payer: PPO | Admitting: Orthopaedic Surgery

## 2017-08-10 ENCOUNTER — Encounter (INDEPENDENT_AMBULATORY_CARE_PROVIDER_SITE_OTHER): Payer: Self-pay | Admitting: Orthopaedic Surgery

## 2017-08-10 VITALS — BP 134/78 | HR 58 | Ht 71.0 in | Wt 185.0 lb

## 2017-08-10 DIAGNOSIS — M7542 Impingement syndrome of left shoulder: Secondary | ICD-10-CM | POA: Diagnosis not present

## 2017-08-10 MED ORDER — BUPIVACAINE HCL 0.25 % IJ SOLN
4.0000 mL | INTRAMUSCULAR | Status: AC | PRN
  Administered 2017-08-10: 4 mL via INTRA_ARTICULAR

## 2017-08-10 MED ORDER — LIDOCAINE HCL 1 % IJ SOLN
1.0000 mL | INTRAMUSCULAR | Status: AC | PRN
  Administered 2017-08-10: 1 mL

## 2017-08-10 MED ORDER — METHYLPREDNISOLONE ACETATE 40 MG/ML IJ SUSP
40.0000 mg | INTRAMUSCULAR | Status: AC | PRN
  Administered 2017-08-10: 40 mg via INTRA_ARTICULAR

## 2017-08-10 NOTE — Progress Notes (Signed)
Office Visit Note   Patient: Andrew Hayes           Date of Birth: [birth year]           MRN: [id number] Visit Date: 08/10/2017              Requested by: [name], MD [place], [place] [address] PCP: [name], MD   Assessment & Plan: Visit Diagnoses:  1. Impingement syndrome of left shoulder     Plan: Left subacromial injection performed with good relief. He has persistent problems he'll let us know. We discussed  possible shoulder arthroscopic treatment of his persistent problems. Previously got many months relief with injection hopefully off similar results with this injection. Return as needed.  Follow-Up Instructions: No Follow-up on file.   Orders:  Orders Placed This Encounter  Procedures  . Large Joint Injection/Arthrocentesis   No orders of the defined types were placed in this encounter.     Procedures: Large Joint Inj Date/Time: 08/10/2017 11:12 AM Performed by: Marybelle Killings Authorized by: Rodell Perna C   Consent Given by:  Patient Indications:  Pain Location:  Shoulder Site:  L subacromial bursa Needle Size:  22 G Needle Length:  1.5 inches Approach:  Lateral Ultrasound Guidance: No   Fluoroscopic Guidance: No   Arthrogram: No   Medications:  1 mL lidocaine 1 %; 40 mg methylPREDNISolone acetate 40 MG/ML; 4 mL bupivacaine 0.25 % Patient tolerance:  Patient tolerated the procedure well with no immediate complications     Clinical Data: No additional findings.   Subjective: Chief Complaint  Patient presents with  . Left Shoulder - Pain, Follow-up    MRI review    HPI 76 year old male returns after MRI scan for persistent problems were shoulder pain without sets reaching external rotation reaching away from his body. He's had previous injection with some relief. MRI scan shows some evidence of bicipital tendinosis and degeneration the superior labrum with tendinosis and fraying of the supraspinatus with type II  acromion but no full-thickness tear. Patient requested repeat injection in the shoulder.  Review of Systems 14 part review of systems updated unchanged from last office visit of than as mentioned in history of present illness.   Objective: Vital Signs: BP 134/78   Pulse (!) 58   Ht 5\' 11"  (1.803 m)   Wt 185 lb (83.9 kg)   BMI 25.80 kg/m   Physical Exam  Constitutional: He is oriented to person, place, and time. He appears well-developed and well-nourished.  HENT:  Head: Normocephalic and atraumatic.  Eyes: Pupils are equal, round, and reactive to light. EOM are normal.  Neck: No tracheal deviation present. No thyromegaly present.  Cardiovascular: Normal rate.   Pulmonary/Chest: Effort normal. He has no wheezes.  Abdominal: Soft. Bowel sounds are normal.  Neurological: He is alert and oriented to person, place, and time.  Skin: Skin is warm and dry. Capillary refill takes less than 2 seconds.  Psychiatric: He has a normal mood and affect. His behavior is normal. Judgment and thought content normal.    Ortho Exam with cervical range of motion no negative crossarm abduction test. Upper extremity reflexes are 2+. Positive impingement left shoulder. Long head of biceps is mild to moderately tender. Negative drop arm test but this reproduces his pain.  Specialty Comments:  No specialty comments available.  Imaging: contrast  Study Result   CLINICAL DATA:  Left upper arm and shoulder pain for 1.5 years. Limited  response to shoulder injection 6 months ago. No specific injury or prior relevant surgery.  EXAM: MRI OF THE LEFT SHOULDER WITHOUT CONTRAST  TECHNIQUE: Multiplanar, multisequence MR imaging of the shoulder was performed. No intravenous contrast was administered.  COMPARISON:  None.  FINDINGS: Rotator cuff: Moderate supraspinatus and infraspinatus tendinosis with bursal surface fraying. There is no discrete rotator cuff tear or tendon retraction. The  subscapularis and teres minor tendons appear normal.  Muscles:  No focal muscular atrophy or edema.  Biceps long head: Probable tendinosis of the intra-articular portion. The distal tendon is normally located in the bicipital groove.  Acromioclavicular Joint: The acromion is type 2. There are moderate acromioclavicular degenerative changes. A small amount of fluid is present in the subacromial -subdeltoid bursa.  Glenohumeral Joint: No significant shoulder joint effusion or glenohumeral arthropathy.  Labrum: Labral assessment is limited by the lack of joint fluid. Probable mild degeneration of the superior labrum without displaced tear or paralabral cyst.  Bones: No acute or significant extra-articular osseous findings.  Other: No significant soft tissue findings.  IMPRESSION: 1. Tendinosis and bursal surface fraying of the supraspinatus and infraspinatus tendons. No discrete rotator cuff tear, tendon retraction or muscular atrophy. 2. Bicipital tendinosis with probable degeneration of the superior labrum. 3. Moderate acromioclavicular degenerative changes and mild subacromial -subdeltoid bursitis.   Electronically Signed   By: Richardean Sale M.D.   On: 08/07/2017 11:16       PMFS History: Patient Active Problem List   Diagnosis Date Noted  . Impingement syndrome of left shoulder 08/10/2017  . Chronic left shoulder pain 07/21/2017  . Dysphagia 06/09/2015  . History of colonic polyps 06/09/2015  . Dizzy spells 03/08/2012  . Hyperlipidemia 03/08/2012   Past Medical History:  Diagnosis Date  . Actinic keratoses   . Diverticulosis    Mild  . Elevated fasting glucose    Mildly  . Herniated disc   . Junctional nevus   . Lipidemia    Mixed  . Mild sleep apnea   . Organic impotence   . Plantar fasciitis   . Prostate cancer [facility])     Family History  Problem Relation Age of Onset  . Stroke Mother   . Hypertension Mother   . Parkinson's disease  Mother   . COPD Father   . Dementia Brother   . Hydrocephalus Brother   . Parkinson's disease Brother   . Parkinson's disease Sister   . Colon cancer Neg Hx     Past Surgical History:  Procedure Laterality Date  . APPENDECTOMY    . COLONOSCOPY    . ELBOW SURGERY  2013  . KNEE ARTHROSCOPY  2013   knee and elbow  . PROSTATECTOMY  1986  . TONSILLECTOMY     Social History   Occupational History  . cpa Retired    retired   Social History Main Topics  . Smoking status: Former Smoker    Packs/day: 2.00    Years: 24.00    Types: Cigarettes    Quit date: 10/11/1984  . Smokeless tobacco: Former Systems developer  . Alcohol use 4.2 oz/week    7 Standard drinks or equivalent per week     Comment: 7 beers weekly  . Drug use: No  . Sexual activity: Not on file

## 2017-08-29 ENCOUNTER — Telehealth (INDEPENDENT_AMBULATORY_CARE_PROVIDER_SITE_OTHER): Payer: Self-pay | Admitting: Orthopaedic Surgery

## 2017-08-29 NOTE — Telephone Encounter (Signed)
Patient called asked for a call back concerning his visit a couple weeks ago. Patient would not give any other information. The number to contact patient is [phone]

## 2017-08-30 NOTE — Telephone Encounter (Signed)
Fyi.

## 2017-08-31 NOTE — Telephone Encounter (Signed)
I called discussed.  

## 2017-09-26 ENCOUNTER — Telehealth: Payer: Self-pay | Admitting: Neurology

## 2017-09-26 DIAGNOSIS — R42 Dizziness and giddiness: Secondary | ICD-10-CM

## 2017-09-26 NOTE — Telephone Encounter (Signed)
Received a VO from Dr. Rexene Alberts to place order to [facility] for vestibular rehab.

## 2017-09-26 NOTE — Addendum Note (Signed)
Addended by: Lester [place] A on: 09/26/2017 05:31 PM   Modules accepted: Orders

## 2017-09-26 NOTE — Telephone Encounter (Signed)
Patient calling to get a referral to [facility] for vertigo and balance issues. Please fax referral to [phone].

## 2017-09-27 NOTE — Telephone Encounter (Signed)
Patient is aware I have talked to Him.  Sent to [employer] [phone] - Fax [phone] .

## 2017-10-12 DIAGNOSIS — R42 Dizziness and giddiness: Secondary | ICD-10-CM | POA: Diagnosis not present

## 2017-10-12 DIAGNOSIS — H903 Sensorineural hearing loss, bilateral: Secondary | ICD-10-CM | POA: Diagnosis not present

## 2017-10-20 DIAGNOSIS — G43109 Migraine with aura, not intractable, without status migrainosus: Secondary | ICD-10-CM | POA: Diagnosis not present

## 2017-10-20 DIAGNOSIS — H903 Sensorineural hearing loss, bilateral: Secondary | ICD-10-CM | POA: Diagnosis not present

## 2017-10-20 DIAGNOSIS — H6122 Impacted cerumen, left ear: Secondary | ICD-10-CM | POA: Diagnosis not present

## 2017-10-20 DIAGNOSIS — H9313 Tinnitus, bilateral: Secondary | ICD-10-CM | POA: Diagnosis not present

## 2017-10-26 DIAGNOSIS — H524 Presbyopia: Secondary | ICD-10-CM | POA: Diagnosis not present

## 2017-10-26 DIAGNOSIS — H5213 Myopia, bilateral: Secondary | ICD-10-CM | POA: Diagnosis not present

## 2017-10-26 DIAGNOSIS — H52223 Regular astigmatism, bilateral: Secondary | ICD-10-CM | POA: Diagnosis not present

## 2017-10-26 DIAGNOSIS — H40033 Anatomical narrow angle, bilateral: Secondary | ICD-10-CM | POA: Diagnosis not present

## 2017-10-26 DIAGNOSIS — H25813 Combined forms of age-related cataract, bilateral: Secondary | ICD-10-CM | POA: Diagnosis not present

## 2017-11-02 ENCOUNTER — Encounter (INDEPENDENT_AMBULATORY_CARE_PROVIDER_SITE_OTHER): Payer: Self-pay | Admitting: Surgery

## 2017-11-02 ENCOUNTER — Ambulatory Visit (INDEPENDENT_AMBULATORY_CARE_PROVIDER_SITE_OTHER): Payer: PPO | Admitting: Surgery

## 2017-11-02 DIAGNOSIS — M7542 Impingement syndrome of left shoulder: Secondary | ICD-10-CM | POA: Diagnosis not present

## 2017-11-02 MED ORDER — LIDOCAINE HCL 1 % IJ SOLN
3.0000 mL | INTRAMUSCULAR | Status: AC | PRN
  Administered 2017-11-02: 3 mL

## 2017-11-02 MED ORDER — METHYLPREDNISOLONE ACETATE 40 MG/ML IJ SUSP
40.0000 mg | INTRAMUSCULAR | Status: AC | PRN
  Administered 2017-11-02: 40 mg via INTRA_ARTICULAR

## 2017-11-02 MED ORDER — BUPIVACAINE HCL 0.25 % IJ SOLN
4.0000 mL | INTRAMUSCULAR | Status: AC | PRN
  Administered 2017-11-02: 4 mL via INTRA_ARTICULAR

## 2017-11-02 NOTE — Progress Notes (Signed)
Office Visit Note   Patient: Andrew Hayes           Date of Birth: Apr 13, 1941           MRN: [id number] Visit Date: 11/02/2017              Requested by: [name], MD [place], [place] [address] PCP: [name], MD   Assessment & Plan: Visit Diagnoses:  1. Impingement syndrome of left shoulder     Plan: Dr. Lorin Mercy stated that it was okay to repeat left shoulder injection today. After patient consent shoulder was prepped with Betadine and subacromial Marcaine/Depo-Medrol injection performed. Advised him to take it easy over the next couple of days. No aggressive activity. Follow-up as needed. Patient understands that ultimately will likely come down him needing surgical intervention of his shoulder. All questions answered.   Follow-Up Instructions: Return if symptoms worsen or fail to improve.   Orders:  Orders Placed This Encounter  Procedures  . Large Joint Inj   No orders of the defined types were placed in this encounter.     Procedures: Large Joint Inj: L subacromial bursa on 11/02/2017 11:32 AM Indications: pain Details: 25 G 1.5 in needle, posterior approach  Arthrogram: No  Medications: 3 mL lidocaine 1 %; 4 mL bupivacaine 0.25 %; 40 mg methylPREDNISolone acetate 40 MG/ML Outcome: tolerated well, no immediate complications Consent was given by the patient. Patient was prepped and draped in the usual sterile fashion.       Clinical Data: No additional findings.   Subjective: Chief Complaint  Patient presents with  . Left Shoulder - Pain    HPI Patient comes in today with complaints of left shoulder pain. MRI scan from 08/06/2017 reports showed: IMPRESSION: 1. Tendinosis and bursal surface fraying of the supraspinatus and infraspinatus tendons. No discrete rotator cuff tear, tendon retraction or muscular atrophy. 2. Bicipital tendinosis with probable degeneration of the superior labrum. 3. Moderate acromioclavicular  degenerative changes and mild subacromial -subdeltoid bursitis.  He is an avid Air cabin crew. Pain with overhead activity and reaching behind his back. Last subacromial Marcaine/Depo-Medrol injection was given by Dr. Lorin Mercy 08/10/2017.   patient is requesting repeat injection today.  Review of Systems No current cardiac pulmonary GI GU issues  Objective: Vital Signs: There were no vitals taken for this visit.  Physical Exam  Constitutional: He is oriented to person, place, and time. No distress.  HENT:  Head: Normocephalic and atraumatic.  Eyes: EOM are normal. Pupils are equal, round, and reactive to light.  Pulmonary/Chest: No respiratory distress.  Musculoskeletal:  The shoulder range of motion about 140-150 flexion/abduction. Positive impingement test. Negative drop arm. Pain with supraspinatus resistance.  Neurological: He is alert and oriented to person, place, and time.    Ortho Exam  Specialty Comments:  No specialty comments available.  Imaging: No results found.   PMFS History: Patient Active Problem List   Diagnosis Date Noted  . Impingement syndrome of left shoulder 08/10/2017  . Chronic left shoulder pain 07/21/2017  . Dysphagia 06/09/2015  . History of colonic polyps 06/09/2015  . Dizzy spells 03/08/2012  . Hyperlipidemia 03/08/2012   Past Medical History:  Diagnosis Date  . Actinic keratoses   . Diverticulosis    Mild  . Elevated fasting glucose    Mildly  . Herniated disc   . Junctional nevus   . Lipidemia    Mixed  . Mild sleep apnea   . Organic impotence   .  Plantar fasciitis   . Prostate cancer [facility])     Family History  Problem Relation Age of Onset  . Stroke Mother   . Hypertension Mother   . Parkinson's disease Mother   . COPD Father   . Dementia Brother   . Hydrocephalus Brother   . Parkinson's disease Brother   . Parkinson's disease Sister   . Colon cancer Neg Hx     Past Surgical History:  Procedure Laterality Date  .  APPENDECTOMY    . COLONOSCOPY    . ELBOW SURGERY  2013  . KNEE ARTHROSCOPY  2013   knee and elbow  . PROSTATECTOMY  1986  . TONSILLECTOMY     Social History   Occupational History  . Occupation: cpa    Employer: RETIRED    Comment: retired  Tobacco Use  . Smoking status: Former Smoker    Packs/day: 2.00    Years: 24.00    Pack years: 48.00    Types: Cigarettes    Last attempt to quit: 10/11/1984    Years since quitting: 33.0  . Smokeless tobacco: Former Network engineer and Sexual Activity  . Alcohol use: Yes    Alcohol/week: 4.2 oz    Types: 7 Standard drinks or equivalent per week    Comment: 7 beers weekly  . Drug use: No  . Sexual activity: Not on file

## 2017-11-23 DIAGNOSIS — H2513 Age-related nuclear cataract, bilateral: Secondary | ICD-10-CM | POA: Diagnosis not present

## 2017-11-23 DIAGNOSIS — G43109 Migraine with aura, not intractable, without status migrainosus: Secondary | ICD-10-CM | POA: Diagnosis not present

## 2017-11-30 DIAGNOSIS — D225 Melanocytic nevi of trunk: Secondary | ICD-10-CM | POA: Diagnosis not present

## 2017-11-30 DIAGNOSIS — L570 Actinic keratosis: Secondary | ICD-10-CM | POA: Diagnosis not present

## 2017-11-30 DIAGNOSIS — L821 Other seborrheic keratosis: Secondary | ICD-10-CM | POA: Diagnosis not present

## 2017-11-30 DIAGNOSIS — Z23 Encounter for immunization: Secondary | ICD-10-CM | POA: Diagnosis not present

## 2017-11-30 DIAGNOSIS — Z85828 Personal history of other malignant neoplasm of skin: Secondary | ICD-10-CM | POA: Diagnosis not present

## 2017-11-30 DIAGNOSIS — Z86018 Personal history of other benign neoplasm: Secondary | ICD-10-CM | POA: Diagnosis not present

## 2017-12-26 ENCOUNTER — Encounter: Payer: Self-pay | Admitting: Neurology

## 2017-12-28 ENCOUNTER — Ambulatory Visit (INDEPENDENT_AMBULATORY_CARE_PROVIDER_SITE_OTHER): Payer: PPO | Admitting: Neurology

## 2017-12-28 ENCOUNTER — Encounter: Payer: Self-pay | Admitting: Neurology

## 2017-12-28 ENCOUNTER — Encounter (INDEPENDENT_AMBULATORY_CARE_PROVIDER_SITE_OTHER): Payer: Self-pay

## 2017-12-28 VITALS — BP 146/87 | HR 74 | Ht 71.0 in | Wt 188.0 lb

## 2017-12-28 DIAGNOSIS — Z9989 Dependence on other enabling machines and devices: Secondary | ICD-10-CM

## 2017-12-28 DIAGNOSIS — R419 Unspecified symptoms and signs involving cognitive functions and awareness: Secondary | ICD-10-CM

## 2017-12-28 DIAGNOSIS — R42 Dizziness and giddiness: Secondary | ICD-10-CM | POA: Diagnosis not present

## 2017-12-28 DIAGNOSIS — G4733 Obstructive sleep apnea (adult) (pediatric): Secondary | ICD-10-CM

## 2017-12-28 NOTE — Progress Notes (Signed)
Subjective:    Patient ID: Andrew Hayes is a 77 y.o. male.  HPI     Interim history:   Andrew Hayes is a 77 year old right-handed gentleman with an underlying medical history of prostate cancer diagnosed in 1996, hyperlipidemia, colonic polyps, diverticulosis, plantar fasciitis, vertigo and dizziness, who presents for follow-up consultation of his obstructive sleep apnea, established on CPAP therapy. I last saw him on 05/18/2017 for a sooner than scheduled appointment because of recurrent vertigo symptoms. I suggested a referral to vestibular rehabilitation. He reported about 2 episodes per year of vertigo, lasting a few days at a time. He had some lightheadedness as well, reported no recent falls. She was trying to stay active, played golf, hydrating well.   He called in the interim in December 2018 requesting a referral to [facility] vestibular clinic. I placed a referral for this. He saw ENT at [facility], Dr. Henderson Hayes on 10/20/2017 and I reviewed the note. He was found to have some hearing loss. He was suspected to have possible vestibular migraine. He had some cerumen impaction on the right side. He was educated regarding a migraine diet. He was felt to be a candidate for amplification for his bilateral sensorineural hearing loss. He was not particularly concerned about his tinnitus.  Today, 12/28/2017: I reviewed his CPAP compliance data from 11/27/2017 through 12/26/2017 which is a total of 30 days, during which time he used his CPAP 28 days with percent used days greater than 4 hours at 87%, indicating very good compliance with an average usage of 6 hours and 14 minutes, residual AHI borderline at 6.2 per hour, leak on the higher end with the 95th percentile at 18.1 L/m on a pressure of 8 cm. He reports doing okay, vest. rehab went okay, actually feels a little better, no big vertigo spell. He feels stable, has not had any fear of falling.    The patient's allergies, current medications, family  history, past medical history, past social history, past surgical history and problem list were reviewed and updated as appropriate.    Previously (copied from previous notes for reference):    I saw him on 12/22/2016, at which time he was compliant with his CPAP. AHI was borderline at 6.3 per hour. He had started seeing Dr. Lorin Mercy for his left shoulder pain, status post injection into the left shoulder. He was worried about his memory and cognitive function, MMSE was 30 out of 30 at the time. He was agreeable to pursuing neuropsychological evaluation. He had a consultation with Dr. Bonita Quin, but opted not to pursue the full cognitive testing at the time.   He called in the interim reporting recurrent bouts of vertigo.   I reviewed his CPAP compliance data from 04/17/2017 through 05/16/2017, which is a total of 30 days, during which time he used his machine every night with percent used days greater than 4 hours at 97%, indicating excellent compliance with an average usage of 6 hours and 31 minutes, residual AHI 4.8 per hour, leak acceptable with the 95th percentile at 13.8 L/m on a pressure of 8 cm.   I reviewed his CPAP compliance data from 11/21/2016 through 12/20/2016, which is a total of 30 days, during which time he used his CPAP every night with percent used days greater than 4 hours at 100%, indicating superb compliance with an average usage of 6 hours and 39 minutes, residual AHI borderline at 6.3 per hour, leak on the high side for the 95th percentile at  22 L/m on a pressure of 8 cm.    I saw him on 12/23/2015, at which time he was fully compliant with CPAP therapy and reported doing well, had no recent concerns, but was overall still worries about his family history which included Parkinson's disease, NPH, dementia. His exam was stable. I suggested a one-year checkup.   I saw him on 06/18/2015 at which time he was referred by his primary care physician for problems with paresthesias and  concern for neuropathy. He reported tingling sensation in both feet. I reviewed Dr. Eddie Dibbles office note from 06/06/2015. Laboratory test results were reviewed from 06/06/2015: CBC with differential was unremarkable with the exception of MCV of 96.4, CMP was unremarkable with the exception of total bilirubin of 1.7, vitamin B12 was 259, TSH normal at 1.53. He described an abnormal sensation at the bottom of his feet but no pain. This was ongoing for 3-4 months at the time. He denied any actual numbness. He had noted no weakness. He had never been told that he had diabetes and was never a heavy alcohol consumer, drinking about 1 beer/day. I suggested we proceed with an EMG and nerve conduction tests. He had this on 07/15/2015: IMPRESSION:   This is a normal study. There was no electrodiagnostic evidence of large fiber peripheral neuropathy or left lumbosacral radiculopathy. But today's test could not rule out the possibility of small fiber neuropathy, if clinically indicated, may consider skin biopsy.   We called him with his test results.   I reviewed his CPAP compliance data from 11/22/2015 through 12/21/2015 which is a total of 30 days during which time he used his machine every night with percent used days greater than 4 hours at 100%, indicating superb compliance with an average usage of 6 hours and 19 minutes, residual AHI 3.4 per hour, leak acceptable with the 95th percentile at 17.4 L/m on a pressure of 8 cm.   I saw him on 05/23/2015 for a new problem of possible hydrocephalus. The patient is unaccompanied today. At the last visit he reported 2 recent episodes of vertigo. He has a long-standing history of vertigo of over 30 years duration. He reported staying active and drinking enough water. He was drinking one beer per day and reported smoking cessation and about 30 years prior. He was a heavy smoker in the past. We talked about his brain MRI with and without contrast from 05/02/2015: No imaging  explanation for vestibular cochlear symptoms as above. Ventriculomegaly. Are there any symptoms of normal pressure hydrocephalus? In addition, I personally reviewed the images through the PACS system and I shared images with him on my computer. He was referred by his ENT doctor, Dr. Janace Hoard who follows him for his Mnire's disease and his vertigo. The patient reported that his memory was not as good. He reported that his older brother died at the age of 54 and was diagnosed with NPH and had a shunt placed. His brother had memory loss but also did not take care of himself very well per patient. The patient was compliant with CPAP treatment felt well with it.    I saw him on 12/25/2014 for follow-up of his OSA. He was compliant with CPAP therapy at the time.    I reviewed his CPAP compliance data from 04/22/2015 through 05/21/2015 which is a total of 30 days during which time he used his machine 22 days with percent used days greater than 4 hours at 70%, indicating adequate compliance with an average  usage of 4 hours and 20 minutes. Residual AHI borderline at 5.8 per hour, leaked low with the 95th percentile at 16.9 L/m on a pressure of 8 cm.     I saw him on 09/19/2014, at which time that he reported trying to be compliant with treatment. He did not do well with nasal pillows. He had trouble tolerating a chinstrap. He had issues with air leaking from the mask. Overall, however, he felt that he was sleeping better since treatment was started. He denied any significant restless leg symptoms. His balance had improved. For better tolerance of reduce his pressure to 8 cm from 9 cm.     I reviewed his CPAP compliance data from 11/24/2014 through 12/23/2014 which is a total of 30 days during which time he used his machine every night with percent used days greater than 4 hours at 97% indicating excellent compliance with an average usage of 5 hours and 38 minutes, setting at 8 cm with EPR of 1. Residual AHI has  increased to 7 per hour but mostly because of central apneas. When we look back at his CPAP titration study results from September 2015, he did have central apneas throughout the study on CPAP then but none with any desaturations. His leak fluctuates in the 95th percentile is at 22.2 L/m at this time. He's using a chinstrap every night. He still does not like it. He worries about his memory but has not had any recent decline. He has a family history of dementia and his older brother. He worries if he should be on a statin. He is on low-dose atorvastatin, 10 mg. He has primarily high triglycerides. He has an appointment with his primary care physician coming up for his yearly checkup. He has seen ENT for vertigo. He has had no recent issues. He does have hearing loss bilaterally.     I first met him on 02/14/2014 at the request of his primary care physician, at which time the patient reported non-restorative sleep, snoring, daytime somnolence and nighttime awakenings, essentially since 1996 (after his prostate cancer surgery). I suggested he return for sleep study. He had a baseline sleep study on 04/18/2014 followed by a CPAP titration study on 06/18/2014 and went over his test results with him in detail today. His baseline sleep study showed a reduced sleep efficiency at 72.8% with a latency to sleep normal at 11 minutes and wake after sleep onset elevated at 92 minutes with mild sleep fragmentation noted. The arousal index was mildly elevated. He had an increased percentage of stage II sleep, 3.4% of slow-wave sleep, and REM sleep at 19.3% with a normal REM latency of 96.5 minutes. He had no significant PLMS or EKG changes. He had no significant EEG changes. He had mild intermittent snoring. Total AHI was 8.4 per hour, rising to 12.3 per hour during REM sleep and 15.3 per hour in the supine position. Average oxygen saturation was 95%, nadir was 81% in REM sleep. Based on his sleep related complaints I advised  him to return for another sleep study for CPAP titration. His sleep efficiency during the second study was 72.8%. Latency to sleep was normal at 8 minutes and wake after sleep onset was elevated at 91.5 minutes with moderate sleep fragmentation noted. He had a mildly elevated arousal index. He had a normal percentage of light stage sleep, an increased percentage of deep sleep at 29.4% and a mildly reduced percentage of REM sleep at 16.5% with a normal REM latency.  He had mild PLMS at 8.5 per hour with an associated arousal index of 4.5 per hour. He had no significant EKG or EEG changes. He was titrated on CPAP from 5-13 cm. AHI was 1.8 per hour at the pressure of 9. Supine REM sleep was achieved. He did indicate sleeping better with CPAP during that night.   I reviewed his CPAP compliance data from 08/14/2014 through 09/12/2014 which is a total of 30 days during which time he used his machine 26 days. Percent used days greater than 4 hours was 83%, indicating fairly good compliance. Average usage was 4 hours and 31 minutes. Pressure at 9 cm. Leak was at times high with the 95th percentile at 27.5 L/m. Average AHI was suboptimal at 7.5 per hour. Looking at the breakdown of his AHI it appears that his central apnea index was elevated at 4.2 per hour.     He had seen Dr. Maxwell Caul for OSA before and had a home sleep test on 10/25/2013 which indicated no significant sleep disorder breathing with an AHI estimated at 3 per hour. Time below 89% saturation was 2 minutes. He reports a bedtime of 11:30 to 11:45 PM, and goes to sleep in minutes, with occasional OTC sleep aid, but not on a night to night basis. He wakes up 1-2 times per night, and sometimes his mind races and he feels it helps to turn his radio on for distraction. He goes to the bathroom 0-1 times per night. His wife's sleep used to disturb him, because of her snoring and restlessness, but they have been sleeping in separate bedrooms for years. He does not  wake up with headaches. He snores, but did not used to snore. He has been able to lose weight and plays golf 2 times a week, goes to the gym 2 times per week. He likes fly fishing. He does not wake up rested. His rise time is between 5:30 to 6 AM. He rarely dreams. He is sleepy during the day, but does not take a nap on a scheduled basis. If he lies down in the afternoon, he will go to sleep. His ESS is 14/24 today. He has fallen asleep at the wheel x 2, both of which happened after a long day of fishing on a longer drive home. He has not had a MVA and no dozing off at the wheel in the last 3 years.   There is no report of nighttime reflux, with no nighttime cough experienced. The patient has not noted any RLS symptoms and is not known to kick while asleep or before falling asleep. There is no family history of RLS or OSA. Father was a heavy snorer, mother had some sleep disruption.    He is not a very restless sleeper.    He denies cataplexy, sleep paralysis, hypnagogic or hypnopompic hallucinations, or sleep attacks. He does not report any vivid dreams, nightmares, dream enactments, or parasomnias, such as sleep talking or sleep walking. The patient has not had an attended sleep study.   He consumes 2 to 3 caffeinated beverages per day, usually in the form of coffee in the morning and ice tea with meals some time.   His bedroom is usually dark and cool. There is no TV in the bedroom and he uses a radio, with low volume.   He quit smoking in '86 and drinks 1 beer per day.     His Past Medical History Is Significant For: Past Medical History:  Diagnosis Date  .  Actinic keratoses   . Diverticulosis    Mild  . Elevated fasting glucose    Mildly  . Herniated disc   . Junctional nevus   . Lipidemia    Mixed  . Mild sleep apnea   . Organic impotence   . Plantar fasciitis   . Prostate cancer [facility])     His Past Surgical History Is Significant For: Past Surgical History:  Procedure Laterality  Date  . APPENDECTOMY    . COLONOSCOPY    . ELBOW SURGERY  2013  . KNEE ARTHROSCOPY  2013   knee and elbow  . PROSTATECTOMY  1986  . TONSILLECTOMY      His Family History Is Significant For: Family History  Problem Relation Age of Onset  . Stroke Mother   . Hypertension Mother   . Parkinson's disease Mother   . COPD Father   . Dementia Brother   . Hydrocephalus Brother   . Parkinson's disease Brother   . Parkinson's disease Sister   . Colon cancer Neg Hx     His Social History Is Significant For: Social History   Socioeconomic History  . Marital status: Married    Spouse name: Hassan Rowan  . Number of children: 2  . Years of education: 29  . Highest education level: None  Social Needs  . Financial resource strain: None  . Food insecurity - worry: None  . Food insecurity - inability: None  . Transportation needs - medical: None  . Transportation needs - non-medical: None  Occupational History  . Occupation: cpa    Employer: RETIRED    Comment: retired  Tobacco Use  . Smoking status: Former Smoker    Packs/day: 2.00    Years: 24.00    Pack years: 48.00    Types: Cigarettes    Last attempt to quit: 10/11/1984    Years since quitting: 33.2  . Smokeless tobacco: Former Network engineer and Sexual Activity  . Alcohol use: Yes    Alcohol/week: 4.2 oz    Types: 7 Standard drinks or equivalent per week    Comment: 7 beers weekly  . Drug use: No  . Sexual activity: None  Other Topics Concern  . None  Social History Narrative   Patient lives at home with family.   Patient consumes 2-3 cups of caffeine daily,right handed.   2 children (1 deceased)    His Allergies Are:  Allergies  Allergen Reactions  . Percocet [Oxycodone-Acetaminophen] Other (See Comments)    Patient states he is unable to tolerate  :   His Current Medications Are:  Outpatient Encounter Medications as of 12/28/2017  Medication Sig  . aspirin 81 MG tablet Take 81 mg by mouth daily.  Marland Kitchen  atorvastatin (LIPITOR) 10 MG tablet Take 1 tablet by mouth daily.  . cholecalciferol (VITAMIN D) 1000 units tablet Take 2,000 Units by mouth daily.  Marland Kitchen co-enzyme Q-10 30 MG capsule Take 30 mg by mouth 2 (two) times daily.   . cyanocobalamin 1000 MCG tablet Take 1,000 mcg by mouth daily.  Marland Kitchen ibuprofen (ADVIL,MOTRIN) 200 MG tablet Take 200 mg by mouth every 6 (six) hours as needed.  . Multiple Vitamin (MULTI-VITAMIN PO) Take by mouth daily.  . Omega-3 Fatty Acids (FISH OIL PO) Take by mouth daily.   No facility-administered encounter medications on file as of 12/28/2017.   :  Review of Systems:  Out of a complete 14 point review of systems, all are reviewed and negative with the exception of  these symptoms as listed below: Review of Systems  Neurological:       Pt presents today to discuss the possibility of atypical migraines causing his symptoms. Pt's cpap is going well.    Objective:  Neurological Exam  Physical Exam Physical Examination:   Vitals:   12/28/17 0920  BP: (!) 146/87  Pulse: 74    General Examination: The patient is a very pleasant 77 y.o. male in no acute distress. He appears well-developed and well-nourished and well groomed.   HEENT:Normocephalic, atraumatic, pupils are equal, round and reactive to light and accommodation. He has corrective eyeglasses. Extraocular tracking is good without limitation to gaze excursion or nystagmus noted. Normal smooth pursuit is noted. Hearing is mildly impaired, seems stable. Face is symmetric with normal facial animation and normal facial sensation. Speech is clear with no dysarthria noted. There is no hypophonia. There is no lip, neck/head, jaw or voice tremor. Neck with FROM. Oropharynx exam reveals: no significantmouth dryness, adequatedental hygiene and moderateairway crowding. Mallampati is class II. Tongue protrudes centrally and palate elevates symmetrically. Tonsils are absent.   Chest:Clear to auscultation without  wheezing, rhonchi or crackles noted.  Heart:S1+S2+0, regular and normal without murmurs, rubs or gallops noted.   Abdomen:Soft, non-tender and non-distended with normal bowel sounds appreciated on auscultation.  Extremities:There is nopitting edema in the distal lower extremities bilaterally. Pedal pulses are intact.  Skin: Warm and dry without trophic changes noted.  Musculoskeletal: exam reveals no obvious joint deformities, tenderness or joint swelling or erythema. Some left shoulder discomfort.  Neurologically:  Mental status: The patient is awake, alert and oriented in all 4 spheres. Hisimmediate and remote memory, attention, language skills and fund of knowledge are appropriate. There is no evidence of aphasia, agnosia, apraxia or anomia. Speech is clear with normal prosody and enunciation. Thought process is linear. Mood is normaland affect is normal.   On 12/22/2016: MMSE: 30/30, CDT: 4/4, AFT 17/min.   Cranial nerves II - XII are as described above under HEENT exam. In addition: shoulder shrug is normal with equal shoulder height noted. Motor exam: Normal bulk, strength and tone is noted. There is no drift, tremor or rebound. Romberg is negative, except for minimal swaying. Reflexes are 1+ throughout. Fine motor skills and coordination: intact in the UEs and LEs.   Cerebellar testing: No dysmetria or intention tremor on finger to nose testing. Heel to shin is unremarkable bilaterally. There is no truncal or gait ataxia.  Sensory exam: intact to light touch in the upper and lower extremities.  Gait, station and balance: Hestands easily. No veering to one side is noted. No leaning to one side is noted. Posture is age-appropriate and stance is narrow based. Gait shows normalstride length and normalpace. No problems turning are noted.   Assessment and Plan:  In summary, Sebasthian Stailey a very pleasant 77 year old malewith an underlying medical history of  hyperlipidemia, prostate cancer in 1996, colonic polyps, diverticulosis, plantar fasciitis, recurrent vertigo, cognitive complaints, intermittent paresthesias and obstructive sleep apnea, well established on CPAP therapy, who returns routine follow-up. He is compliant with CPAP and commended for this. As far as his vertigo he has not had any significant health since mid last year thankfully. He had a consultation at [facility] for his vertigo. He was noted to have sensorineural hearing loss but is not pursuing hearing aids at this time, tinnitus does not typically bother him that much. He was given a diet recommendation for possibility of underlying vertiginous migraines. He  does not have a history of migraine headaches. He went to neuro rehabilitation and they gave him some handouts for exercises which he found helpful. He looks well and feels stable at this time. He does have a history of cognitive complaints and concerns about dementia what with his family history but feels stable in that regard and previously we had pursued memory testing but he decided not to go through with the full cognitive testing batteries. From my end of things he looks well and is advised to continue to pursue healthy lifestyle, weight has been stable, he exercises on a regular basis, is active physically and socially. He tries to hydrate well. I suggested a one-year checkup, he can see one of our nurse practitioners next time. I answered all his questions today and he was in agreement. I spent 25 minutes in total face-to-face time with the patient, more than 50% of which was spent in counseling and coordination of care, reviewing test results, reviewing medication and discussing or reviewing the diagnosis of OSA, recurrent vertigo, its prognosis and treatment options. Pertinent laboratory and imaging test results that were available during this visit with the patient were reviewed by me and considered in my medical decision making (see  chart for details).

## 2017-12-28 NOTE — Patient Instructions (Addendum)
Please continue using your CPAP regularly. While your insurance requires that you use CPAP at least 4 hours each night on 70% of the nights, I recommend, that you not skip any nights and use it throughout the night if you can. Getting used to CPAP and staying with the treatment long term does take time and patience and discipline. Untreated obstructive sleep apnea when it is moderate to severe can have an adverse impact on cardiovascular health and raise her risk for heart disease, arrhythmias, hypertension, congestive heart failure, stroke and diabetes. Untreated obstructive sleep apnea causes sleep disruption, nonrestorative sleep, and sleep deprivation. This can have an impact on your day to day functioning and cause daytime sleepiness and impairment of cognitive function, memory loss, mood disturbance, and problems focussing. Using CPAP regularly can improve these symptoms.  Keep up the good work! We can see you in 1 year, you can see one of our nurse practitioners as you are stable. I will see you after that.   Keep taking care of yourself. You look well and have taken care of your diet and exercise.

## 2018-02-08 ENCOUNTER — Telehealth (INDEPENDENT_AMBULATORY_CARE_PROVIDER_SITE_OTHER): Payer: Self-pay | Admitting: Orthopaedic Surgery

## 2018-02-08 NOTE — Telephone Encounter (Signed)
Patient called and wanted you to call him back, he stated that he had some questions.  CB#[phone].  Thank you.

## 2018-02-09 NOTE — Telephone Encounter (Signed)
I called and spoke with Mr. Bazar. Phone call is actually regarding his wife. Note placed in her chart and sent to Dr. Lorin Mercy.

## 2018-02-17 ENCOUNTER — Telehealth (INDEPENDENT_AMBULATORY_CARE_PROVIDER_SITE_OTHER): Payer: Self-pay | Admitting: Orthopaedic Surgery

## 2018-02-17 NOTE — Telephone Encounter (Signed)
I called left message I will call back

## 2018-02-17 NOTE — Telephone Encounter (Signed)
Please advise 

## 2018-02-17 NOTE — Telephone Encounter (Signed)
He was calling about Neuro referral status for his wife. He is talking to [place] about this

## 2018-02-17 NOTE — Telephone Encounter (Signed)
Patient has some questions and would like a call back from you. CB # [phone]

## 2018-03-28 DIAGNOSIS — Z79899 Other long term (current) drug therapy: Secondary | ICD-10-CM | POA: Diagnosis not present

## 2018-03-28 DIAGNOSIS — E7800 Pure hypercholesterolemia, unspecified: Secondary | ICD-10-CM | POA: Diagnosis not present

## 2018-03-28 DIAGNOSIS — Z125 Encounter for screening for malignant neoplasm of prostate: Secondary | ICD-10-CM | POA: Diagnosis not present

## 2018-03-29 DIAGNOSIS — G4730 Sleep apnea, unspecified: Secondary | ICD-10-CM | POA: Diagnosis not present

## 2018-03-29 DIAGNOSIS — Z0000 Encounter for general adult medical examination without abnormal findings: Secondary | ICD-10-CM | POA: Diagnosis not present

## 2018-03-29 DIAGNOSIS — Z86010 Personal history of colonic polyps: Secondary | ICD-10-CM | POA: Diagnosis not present

## 2018-03-29 DIAGNOSIS — R7301 Impaired fasting glucose: Secondary | ICD-10-CM | POA: Diagnosis not present

## 2018-03-29 DIAGNOSIS — E7800 Pure hypercholesterolemia, unspecified: Secondary | ICD-10-CM | POA: Diagnosis not present

## 2018-03-29 DIAGNOSIS — R8290 Unspecified abnormal findings in urine: Secondary | ICD-10-CM | POA: Diagnosis not present

## 2018-03-29 DIAGNOSIS — Z8546 Personal history of malignant neoplasm of prostate: Secondary | ICD-10-CM | POA: Diagnosis not present

## 2018-03-29 DIAGNOSIS — Z1389 Encounter for screening for other disorder: Secondary | ICD-10-CM | POA: Diagnosis not present

## 2018-03-29 DIAGNOSIS — Z8719 Personal history of other diseases of the digestive system: Secondary | ICD-10-CM | POA: Diagnosis not present

## 2018-03-29 DIAGNOSIS — Z79899 Other long term (current) drug therapy: Secondary | ICD-10-CM | POA: Diagnosis not present

## 2018-04-26 DIAGNOSIS — G4733 Obstructive sleep apnea (adult) (pediatric): Secondary | ICD-10-CM | POA: Diagnosis not present

## 2018-04-27 DIAGNOSIS — G4733 Obstructive sleep apnea (adult) (pediatric): Secondary | ICD-10-CM | POA: Diagnosis not present

## 2018-05-11 ENCOUNTER — Ambulatory Visit (INDEPENDENT_AMBULATORY_CARE_PROVIDER_SITE_OTHER): Payer: PPO | Admitting: Surgery

## 2018-05-11 ENCOUNTER — Encounter (INDEPENDENT_AMBULATORY_CARE_PROVIDER_SITE_OTHER): Payer: Self-pay | Admitting: Surgery

## 2018-05-11 ENCOUNTER — Ambulatory Visit (INDEPENDENT_AMBULATORY_CARE_PROVIDER_SITE_OTHER): Payer: PPO

## 2018-05-11 VITALS — BP 152/82 | HR 61 | Ht 71.0 in | Wt 188.0 lb

## 2018-05-11 DIAGNOSIS — M25571 Pain in right ankle and joints of right foot: Secondary | ICD-10-CM | POA: Diagnosis not present

## 2018-05-11 DIAGNOSIS — S93491A Sprain of other ligament of right ankle, initial encounter: Secondary | ICD-10-CM | POA: Diagnosis not present

## 2018-05-11 NOTE — Progress Notes (Signed)
Office Visit Note   Patient: Andrew Hayes           Date of Birth: 11/01/1940           MRN: [id number] Visit Date: 05/11/2018              Requested by: [name], MD [place], [place] [id number] PCP: [name], MD   Assessment & Plan: Visit Diagnoses:  1. Pain in right ankle and joints of right foot   2. Sprain of anterior talofibular ligament of right ankle, initial encounter     Plan: Patient was put in a lace up brace today.  We will continue to ice off and on as needed and use oral NSAID.  Follow-up in 3 weeks for recheck.  If he is pain-free and not have any issues he may cancel the appointment.  Follow-Up Instructions: Return in about 3 weeks (around 06/01/2018).   Orders:  Orders Placed This Encounter  Procedures  . XR Ankle Complete Right   No orders of the defined types were placed in this encounter.     Procedures: No procedures performed   Clinical Data: No additional findings.   Subjective: Chief Complaint  Patient presents with  . Right Ankle - Pain    HPI 77 year old white male comes in today with complaints of right lateral ankle pain.  States that he was fishing in a stream about 30 days ago when he slipped on a rock and rolled his ankle.  Pain over the ATFL.  At the time of injury he did have a lot of lateral ankle swelling and some bruising.  Swelling has decreased.  He has returned back to his activities but admits to having some ongoing lateral ankle pain.  No feeling of instability. Review of Systems No current cardiac pulmonary GI GU issues  Objective: Vital Signs: BP (!) 152/82   Pulse 61   Ht 5\' 11"  (1.803 m)   Wt 188 lb (85.3 kg)   BMI 26.22 kg/m   Physical Exam  Constitutional: He is oriented to person, place, and time. No distress.  HENT:  Head: Normocephalic.  Eyes: Pupils are equal, round, and reactive to light. EOM are normal.  Musculoskeletal:  Gait is normal.  Has some discomfort with a  single limb heel raise.  Minimal lateral ankle swelling.  He is mildly tender over the ATFL.  Ankle ligaments are stable.  Neurovascular intact.  Neurological: He is alert and oriented to person, place, and time.  Skin: Skin is warm and dry.    Ortho Exam  Specialty Comments:  No specialty comments available.  Imaging: No results found.   PMFS History: Patient Active Problem List   Diagnosis Date Noted  . Impingement syndrome of left shoulder 08/10/2017  . Chronic left shoulder pain 07/21/2017  . Dysphagia 06/09/2015  . History of colonic polyps 06/09/2015  . Dizzy spells 03/08/2012  . Hyperlipidemia 03/08/2012   Past Medical History:  Diagnosis Date  . Actinic keratoses   . Diverticulosis    Mild  . Elevated fasting glucose    Mildly  . Herniated disc   . Junctional nevus   . Lipidemia    Mixed  . Mild sleep apnea   . Organic impotence   . Plantar fasciitis   . Prostate cancer [facility])     Family History  Problem Relation Age of Onset  . Stroke Mother   . Hypertension Mother   . Parkinson's disease Mother   .  COPD Father   . Dementia Brother   . Hydrocephalus Brother   . Parkinson's disease Brother   . Parkinson's disease Sister   . Colon cancer Neg Hx     Past Surgical History:  Procedure Laterality Date  . APPENDECTOMY    . COLONOSCOPY    . ELBOW SURGERY  2013  . KNEE ARTHROSCOPY  2013   knee and elbow  . PROSTATECTOMY  1986  . TONSILLECTOMY     Social History   Occupational History  . Occupation: cpa    Employer: RETIRED    Comment: retired  Tobacco Use  . Smoking status: Former Smoker    Packs/day: 2.00    Years: 24.00    Pack years: 48.00    Types: Cigarettes    Last attempt to quit: 10/11/1984    Years since quitting: 33.6  . Smokeless tobacco: Former Network engineer and Sexual Activity  . Alcohol use: Yes    Alcohol/week: 4.2 oz    Types: 7 Standard drinks or equivalent per week    Comment: 7 beers weekly  . Drug use: No  . Sexual  activity: Not on file

## 2018-06-01 ENCOUNTER — Encounter (INDEPENDENT_AMBULATORY_CARE_PROVIDER_SITE_OTHER): Payer: Self-pay | Admitting: Surgery

## 2018-06-01 ENCOUNTER — Ambulatory Visit (INDEPENDENT_AMBULATORY_CARE_PROVIDER_SITE_OTHER): Payer: PPO | Admitting: Surgery

## 2018-06-01 DIAGNOSIS — M25571 Pain in right ankle and joints of right foot: Secondary | ICD-10-CM | POA: Diagnosis not present

## 2018-06-01 NOTE — Progress Notes (Signed)
Office Visit Note   Patient: Andrew Hayes           Date of Birth: 1941/09/13           MRN: [id number] Visit Date: 06/01/2018              Requested by: [name], MD [place], [place] [id number] PCP: [name], MD   Assessment & Plan: Visit Diagnoses:  1. Pain in right ankle and joints of right foot     Plan: Recommend the patient return back to wearing ASO brace as previously recommended.  Can attempt to wean out of this over the next 1 to 2 weeks.  Continue icing off and on as needed and using ibuprofen.  Recommend that he increase his dose to 800 mg every 8 hours as needed with food.  Return if symptoms not getting better over the next several weeks.  Follow-Up Instructions: Return if symptoms worsen or fail to improve.   Orders:  No orders of the defined types were placed in this encounter.  No orders of the defined types were placed in this encounter.     Procedures: No procedures performed   Clinical Data: No additional findings.   Subjective: Chief Complaint  Patient presents with  . Right Ankle - Follow-up    HPI 77 year old white male returns for recheck of his right ankle sprain.  States that he continues to have some soreness lateral ankle.  He stopped wearing the ASO brace that was given last office visit because it was somewhat of a hassle to get it off and on.  States that the had a ankle sleeve at home and started using this but this is not give any sort of immobilization/support above the ankle joint. Review of Systems No current cardiac pulmonary GI GU issues  Objective: Vital Signs: There were no vitals taken for this visit.  Physical Exam  Constitutional: He is oriented to person, place, and time. No distress.  HENT:  Head: Normocephalic and atraumatic.  Eyes: Pupils are equal, round, and reactive to light. EOM are normal.  Pulmonary/Chest: No respiratory distress.  Musculoskeletal:  Gait is normal.  Ankle he  has good range of motion.  Ligament stable.  Is still somewhat tender over the ATFL and also tender over the peroneal tendon.  No bruising or swelling.  Calf nontender.  Neurological: He is alert and oriented to person, place, and time.  Psychiatric: He has a normal mood and affect.    Ortho Exam  Specialty Comments:  No specialty comments available.  Imaging: No results found.   PMFS History: Patient Active Problem List   Diagnosis Date Noted  . Impingement syndrome of left shoulder 08/10/2017  . Chronic left shoulder pain 07/21/2017  . Dysphagia 06/09/2015  . History of colonic polyps 06/09/2015  . Dizzy spells 03/08/2012  . Hyperlipidemia 03/08/2012   Past Medical History:  Diagnosis Date  . Actinic keratoses   . Diverticulosis    Mild  . Elevated fasting glucose    Mildly  . Herniated disc   . Junctional nevus   . Lipidemia    Mixed  . Mild sleep apnea   . Organic impotence   . Plantar fasciitis   . Prostate cancer [facility])     Family History  Problem Relation Age of Onset  . Stroke Mother   . Hypertension Mother   . Parkinson's disease Mother   . COPD Father   . Dementia Brother   .  Hydrocephalus Brother   . Parkinson's disease Brother   . Parkinson's disease Sister   . Colon cancer Neg Hx     Past Surgical History:  Procedure Laterality Date  . APPENDECTOMY    . COLONOSCOPY    . ELBOW SURGERY  2013  . KNEE ARTHROSCOPY  2013   knee and elbow  . PROSTATECTOMY  1986  . TONSILLECTOMY     Social History   Occupational History  . Occupation: cpa    Employer: RETIRED    Comment: retired  Tobacco Use  . Smoking status: Former Smoker    Packs/day: 2.00    Years: 24.00    Pack years: 48.00    Types: Cigarettes    Last attempt to quit: 10/11/1984    Years since quitting: 33.6  . Smokeless tobacco: Former Network engineer and Sexual Activity  . Alcohol use: Yes    Alcohol/week: 7.0 standard drinks    Types: 7 Standard drinks or equivalent per week      Comment: 7 beers weekly  . Drug use: No  . Sexual activity: Not on file

## 2018-06-25 DIAGNOSIS — R030 Elevated blood-pressure reading, without diagnosis of hypertension: Secondary | ICD-10-CM | POA: Diagnosis not present

## 2018-07-04 ENCOUNTER — Ambulatory Visit (INDEPENDENT_AMBULATORY_CARE_PROVIDER_SITE_OTHER): Payer: PPO | Admitting: Family Medicine

## 2018-07-04 ENCOUNTER — Encounter (INDEPENDENT_AMBULATORY_CARE_PROVIDER_SITE_OTHER): Payer: Self-pay | Admitting: Family Medicine

## 2018-07-04 DIAGNOSIS — M25571 Pain in right ankle and joints of right foot: Secondary | ICD-10-CM

## 2018-07-04 DIAGNOSIS — M7671 Peroneal tendinitis, right leg: Secondary | ICD-10-CM | POA: Diagnosis not present

## 2018-07-04 NOTE — Progress Notes (Signed)
I saw and examined the patient with Dr. Okey Dupre and agree with assessment and plan as outlined.  Inject peroneal tendon sheath today.  Nitro patches. Home exercises. Consider PT or PRP injection if pain persists.

## 2018-07-04 NOTE — Progress Notes (Signed)
  Andrew Hayes - 77 y.o. male MRN [id number]  Date of birth: 10-23-1940    SUBJECTIVE:      Chief Complaint:/ HPI:  77 year old male with right ankle pain.  Patient presents with 3 months of 4-5/10 lateral right ankle pain.  Patient states he slipped while fishing and inverted his ankle.  He was seen initially following injury, x-rays were obtained to rule out fracture, and he was placed in ASO.  He has been wearing his ankle brace when more active and taking Advil as needed for pain.  His pain is worse with increased activity and in particular dorsiflexion.  He feels more pain while driving or when he tries to go to the gym.  He has been doing some light golfing which she has tolerated.  He denies any local swelling or erythema.  No numbness or tingling in the foot.  No bruising.  No skin changes   ROS:     See HPI  PERTINENT  PMH / PSH FH / / SH:  Past Medical, Surgical, Social, and Family History Reviewed & Updated in the EMR.   OBJECTIVE: There were no vitals taken for this visit.  Physical Exam:  Vital signs are reviewed.  GEN: Alert and oriented, NAD Pulm: Breathing unlabored PSY: normal mood, congruent affect  MSK: Right ankle: - Inspection: No obvious deformity, erythema, swelling, or ecchymosis - Palpation: No bony or ligamentous tenderness.  Tenderness over the peroneal tendons. - Strength: Normal strength with dorsiflexion, plantarflexion, inversion, and eversion.  Pain with resisted dorsiflexion and eversion - ROM: Full ROM - Neuro/vasc: NV intact - Special Tests: Negative anterior drawer.  Negative syndesmotic compression.  Negative talar tilt - MSK [place]: limited US of the lateral ankle. Peroneus brevis and longus viewed in long and transverse. There appears to be a small, approximately 53mm intrasubstance longitudinal tear in the peroneus longus tendon just below the lateral malleolus.  Left ankle.  No deformity or swelling No TTP Full ROM with 5/5 strength NV  intact   ASSESSMENT & PLAN:  1.  Peroneal tendinopathy- overall patient's pain does not seem to be related to ligamentous injury.  Based on physical and ultrasound exam, there is evidence of peroneal tendinopathy.  Multiple treatment options were discussed with the patient - Ice -Continue NSAIDs as needed -Patient elected for steroid injection which were performed today as described below - Nitroglycerin patches - Patient instructed on home exercise and provided with Thera-Band -Follow-up in 6 weeks or as needed   Procedure performed: Right peroneal tendon steroid injection; US guided  Consent obtained and verified. Time-out conducted. Noted no overlying erythema, induration, or other signs of local infection. The  right peroneal tendons were identified with ultrasound.  The overlying skin was prepped in a sterile fashion. Topical analgesic spray: Ethyl chloride. Needle: 25-gauge, 1.5 inch Using an in plane approach the needle was advanced into the tendon sheath and medications were injected.  Completed without difficulty. Meds: Lidocaine 3 cc, Depo-Medrol 40 mg

## 2018-07-04 NOTE — Patient Instructions (Signed)

## 2018-07-05 ENCOUNTER — Telehealth (INDEPENDENT_AMBULATORY_CARE_PROVIDER_SITE_OTHER): Payer: Self-pay | Admitting: Family Medicine

## 2018-07-05 NOTE — Telephone Encounter (Signed)
Patient called advised the Rx for his ankle is not showing received at the pharmacy yet. Patient uses the [facility] on [address]. The number to contact patient is [phone]

## 2018-07-06 ENCOUNTER — Other Ambulatory Visit (INDEPENDENT_AMBULATORY_CARE_PROVIDER_SITE_OTHER): Payer: Self-pay | Admitting: Family Medicine

## 2018-07-06 MED ORDER — NITROGLYCERIN 0.1 MG/HR TD PT24: MEDICATED_PATCH | TRANSDERMAL | 3 refills | Status: DC

## 2018-07-06 NOTE — Telephone Encounter (Signed)
I do not see a Rx from 07/04/18 visit in the chart, pls advise? Thanks.

## 2018-07-06 NOTE — Telephone Encounter (Signed)
I just sent the Rx.

## 2018-07-26 DIAGNOSIS — Z23 Encounter for immunization: Secondary | ICD-10-CM | POA: Diagnosis not present

## 2018-07-26 DIAGNOSIS — G43109 Migraine with aura, not intractable, without status migrainosus: Secondary | ICD-10-CM | POA: Diagnosis not present

## 2018-08-01 DIAGNOSIS — L570 Actinic keratosis: Secondary | ICD-10-CM | POA: Diagnosis not present

## 2018-08-03 DIAGNOSIS — R2681 Unsteadiness on feet: Secondary | ICD-10-CM | POA: Diagnosis not present

## 2018-08-08 DIAGNOSIS — S01541A Puncture wound with foreign body of lip, initial encounter: Secondary | ICD-10-CM | POA: Diagnosis not present

## 2018-08-08 DIAGNOSIS — S00551A Superficial foreign body of lip, initial encounter: Secondary | ICD-10-CM | POA: Diagnosis not present

## 2018-08-09 DIAGNOSIS — R2681 Unsteadiness on feet: Secondary | ICD-10-CM | POA: Diagnosis not present

## 2018-08-29 ENCOUNTER — Ambulatory Visit (INDEPENDENT_AMBULATORY_CARE_PROVIDER_SITE_OTHER): Payer: PPO | Admitting: Family Medicine

## 2018-08-29 ENCOUNTER — Encounter (INDEPENDENT_AMBULATORY_CARE_PROVIDER_SITE_OTHER): Payer: Self-pay | Admitting: Family Medicine

## 2018-08-29 DIAGNOSIS — M7671 Peroneal tendinitis, right leg: Secondary | ICD-10-CM

## 2018-08-29 DIAGNOSIS — R42 Dizziness and giddiness: Secondary | ICD-10-CM

## 2018-08-29 MED ORDER — RIZATRIPTAN BENZOATE 10 MG PO TBDP: 10.0000 mg | ORAL_TABLET | ORAL | 6 refills | Status: DC | PRN

## 2018-08-29 NOTE — Progress Notes (Signed)
I saw and examined the patient with Dr. Okey Dupre and agree with assessment and plan as outlined.    Try PT at [facility] for right ankle and vertigo/silent migraines.  Try triptan for migraine.   Could try food elimination diet if persists.

## 2018-08-29 NOTE — Progress Notes (Signed)
  Andrew Hayes - 77 y.o. male MRN [id number]  Date of birth: Mar 04, 1941    SUBJECTIVE:    Chief Complaint:/ HPI:  77 year old male presents with right lateral ankle pain.  Patient returns for lateral right ankle pain.  He was seen approximately 8 weeks ago for similar pain.  He had evidence of peroneal tenosynovitis.  He was treated with ASO, home exercises, nitroglycerin patch, and steroid injection.  He reports gradual, and ultimately complete resolution of his pain for a few weeks.  Now, 3 weeks ago his pain returned and seems to be worse than it was originally.  His pain is now located slightly more proximally and posterior to the lateral malleolus.  He also reports return of some swelling.  This pain is now affecting daily activity when going to the gym or doing his balance exercises for rehab.  Last week he restarted the nitroglycerin patches.  He denies any associated erythema or bruising.  No numbness or tingling  Additionally today, the patient describes his symptoms of vestibular migraines.  His balance rehab that he does as part of near rehab to help with the symptoms.  In discussion, he states that he has never been tried on a triptan for abortive therapy.  ROS:     See HPI  PERTINENT  PMH / PSH FH / / SH:  Past Medical, Surgical, Social, and Family History Reviewed & Updated in the EMR.  Pertinent findings include:  Vestibular migraines  OBJECTIVE: There were no vitals taken for this visit.  Physical Exam:  Vital signs are reviewed.  GEN: Alert and oriented, NAD Pulm: Breathing unlabored PSY: normal mood, congruent affect  MSK: Right ankle: - Inspection: No obvious deformity.  Soft tissue swelling posterior to the lateral malleolus.  No erythema or bruising - Palpation: Tenderness along the peroneal tendons posterior to and inferior to the lateral malleolus - Strength: Normal strength with dorsiflexion, plantarflexion, inversion, and eversion.  Reproduced pain with  resisted dorsiflexion - ROM: Full ROM - Neuro/vasc: NV intact    ASSESSMENT & PLAN:  1.  Peroneal tendinitis, right- patient has return of his peroneal tendinitis. - Restart nitroglycerin patch - Referral to physical therapy - Activity as tolerated  2.  Vertigo secondary to vestibular migraines- patient describes symptoms of vestibular migraines and vertigo which at times can be debilitating for up to 48 hours. -We will trial him on more medication with a triptan since he has never tried this before.

## 2018-08-31 DIAGNOSIS — R2689 Other abnormalities of gait and mobility: Secondary | ICD-10-CM | POA: Diagnosis not present

## 2018-08-31 DIAGNOSIS — M7671 Peroneal tendinitis, right leg: Secondary | ICD-10-CM | POA: Diagnosis not present

## 2018-08-31 DIAGNOSIS — R42 Dizziness and giddiness: Secondary | ICD-10-CM | POA: Diagnosis not present

## 2018-09-01 DIAGNOSIS — M7671 Peroneal tendinitis, right leg: Secondary | ICD-10-CM | POA: Diagnosis not present

## 2018-09-01 DIAGNOSIS — S86391D Other injury of muscle(s) and tendon(s) of peroneal muscle group at lower leg level, right leg, subsequent encounter: Secondary | ICD-10-CM | POA: Diagnosis not present

## 2018-09-05 DIAGNOSIS — M7671 Peroneal tendinitis, right leg: Secondary | ICD-10-CM | POA: Diagnosis not present

## 2018-09-05 DIAGNOSIS — S86391D Other injury of muscle(s) and tendon(s) of peroneal muscle group at lower leg level, right leg, subsequent encounter: Secondary | ICD-10-CM | POA: Diagnosis not present

## 2018-09-12 DIAGNOSIS — M7671 Peroneal tendinitis, right leg: Secondary | ICD-10-CM | POA: Diagnosis not present

## 2018-09-12 DIAGNOSIS — R2689 Other abnormalities of gait and mobility: Secondary | ICD-10-CM | POA: Diagnosis not present

## 2018-09-12 DIAGNOSIS — R42 Dizziness and giddiness: Secondary | ICD-10-CM | POA: Diagnosis not present

## 2018-09-13 ENCOUNTER — Telehealth (INDEPENDENT_AMBULATORY_CARE_PROVIDER_SITE_OTHER): Payer: Self-pay | Admitting: Family Medicine

## 2018-09-13 DIAGNOSIS — S86391D Other injury of muscle(s) and tendon(s) of peroneal muscle group at lower leg level, right leg, subsequent encounter: Secondary | ICD-10-CM | POA: Diagnosis not present

## 2018-09-13 DIAGNOSIS — M7671 Peroneal tendinitis, right leg: Secondary | ICD-10-CM | POA: Diagnosis not present

## 2018-09-13 NOTE — Telephone Encounter (Signed)
Beth @ [facility]. Called for any imaging on the ankle. I advised her that none were done

## 2018-09-15 DIAGNOSIS — R42 Dizziness and giddiness: Secondary | ICD-10-CM | POA: Diagnosis not present

## 2018-09-15 DIAGNOSIS — M7671 Peroneal tendinitis, right leg: Secondary | ICD-10-CM | POA: Diagnosis not present

## 2018-09-15 DIAGNOSIS — R2689 Other abnormalities of gait and mobility: Secondary | ICD-10-CM | POA: Diagnosis not present

## 2018-09-19 DIAGNOSIS — S86391D Other injury of muscle(s) and tendon(s) of peroneal muscle group at lower leg level, right leg, subsequent encounter: Secondary | ICD-10-CM | POA: Diagnosis not present

## 2018-09-19 DIAGNOSIS — M7671 Peroneal tendinitis, right leg: Secondary | ICD-10-CM | POA: Diagnosis not present

## 2018-09-21 DIAGNOSIS — M7671 Peroneal tendinitis, right leg: Secondary | ICD-10-CM | POA: Diagnosis not present

## 2018-09-21 DIAGNOSIS — R42 Dizziness and giddiness: Secondary | ICD-10-CM | POA: Diagnosis not present

## 2018-09-21 DIAGNOSIS — R2689 Other abnormalities of gait and mobility: Secondary | ICD-10-CM | POA: Diagnosis not present

## 2018-09-26 DIAGNOSIS — M7671 Peroneal tendinitis, right leg: Secondary | ICD-10-CM | POA: Diagnosis not present

## 2018-09-26 DIAGNOSIS — S86391D Other injury of muscle(s) and tendon(s) of peroneal muscle group at lower leg level, right leg, subsequent encounter: Secondary | ICD-10-CM | POA: Diagnosis not present

## 2018-09-28 DIAGNOSIS — R42 Dizziness and giddiness: Secondary | ICD-10-CM | POA: Diagnosis not present

## 2018-09-28 DIAGNOSIS — R2689 Other abnormalities of gait and mobility: Secondary | ICD-10-CM | POA: Diagnosis not present

## 2018-09-28 DIAGNOSIS — M7671 Peroneal tendinitis, right leg: Secondary | ICD-10-CM | POA: Diagnosis not present

## 2018-09-29 DIAGNOSIS — M7671 Peroneal tendinitis, right leg: Secondary | ICD-10-CM | POA: Diagnosis not present

## 2018-09-29 DIAGNOSIS — S86391D Other injury of muscle(s) and tendon(s) of peroneal muscle group at lower leg level, right leg, subsequent encounter: Secondary | ICD-10-CM | POA: Diagnosis not present

## 2018-10-03 DIAGNOSIS — M7671 Peroneal tendinitis, right leg: Secondary | ICD-10-CM | POA: Diagnosis not present

## 2018-10-03 DIAGNOSIS — S86391D Other injury of muscle(s) and tendon(s) of peroneal muscle group at lower leg level, right leg, subsequent encounter: Secondary | ICD-10-CM | POA: Diagnosis not present

## 2018-10-10 DIAGNOSIS — S86391D Other injury of muscle(s) and tendon(s) of peroneal muscle group at lower leg level, right leg, subsequent encounter: Secondary | ICD-10-CM | POA: Diagnosis not present

## 2018-10-10 DIAGNOSIS — M7671 Peroneal tendinitis, right leg: Secondary | ICD-10-CM | POA: Diagnosis not present

## 2018-10-11 DIAGNOSIS — A6920 Lyme disease, unspecified: Secondary | ICD-10-CM

## 2018-10-11 HISTORY — DX: Lyme disease, unspecified: A69.20

## 2018-10-25 DIAGNOSIS — S86391D Other injury of muscle(s) and tendon(s) of peroneal muscle group at lower leg level, right leg, subsequent encounter: Secondary | ICD-10-CM | POA: Diagnosis not present

## 2018-10-25 DIAGNOSIS — H25813 Combined forms of age-related cataract, bilateral: Secondary | ICD-10-CM | POA: Diagnosis not present

## 2018-10-25 DIAGNOSIS — H40013 Open angle with borderline findings, low risk, bilateral: Secondary | ICD-10-CM | POA: Diagnosis not present

## 2018-10-25 DIAGNOSIS — M7671 Peroneal tendinitis, right leg: Secondary | ICD-10-CM | POA: Diagnosis not present

## 2018-10-25 DIAGNOSIS — H52223 Regular astigmatism, bilateral: Secondary | ICD-10-CM | POA: Diagnosis not present

## 2018-10-25 DIAGNOSIS — H40033 Anatomical narrow angle, bilateral: Secondary | ICD-10-CM | POA: Diagnosis not present

## 2018-10-25 DIAGNOSIS — H5213 Myopia, bilateral: Secondary | ICD-10-CM | POA: Diagnosis not present

## 2018-12-13 DIAGNOSIS — Z23 Encounter for immunization: Secondary | ICD-10-CM | POA: Diagnosis not present

## 2018-12-13 DIAGNOSIS — Z86018 Personal history of other benign neoplasm: Secondary | ICD-10-CM | POA: Diagnosis not present

## 2018-12-13 DIAGNOSIS — D225 Melanocytic nevi of trunk: Secondary | ICD-10-CM | POA: Diagnosis not present

## 2018-12-13 DIAGNOSIS — L851 Acquired keratosis [keratoderma] palmaris et plantaris: Secondary | ICD-10-CM | POA: Diagnosis not present

## 2018-12-13 DIAGNOSIS — L821 Other seborrheic keratosis: Secondary | ICD-10-CM | POA: Diagnosis not present

## 2018-12-13 DIAGNOSIS — Z85828 Personal history of other malignant neoplasm of skin: Secondary | ICD-10-CM | POA: Diagnosis not present

## 2018-12-13 DIAGNOSIS — L570 Actinic keratosis: Secondary | ICD-10-CM | POA: Diagnosis not present

## 2019-01-02 ENCOUNTER — Telehealth: Payer: Self-pay

## 2019-01-02 NOTE — Telephone Encounter (Signed)
Due to current COVID 19 pandemic, our office is severely reducing in office visits for at least the next 2 weeks, in order to minimize the risk to our patients and healthcare providers.   I called pt and discussed this with him, offered him a delayed in office visit or a telephone visit with Dr. Rexene Alberts tomorrow. Pt chose a telephone visit with Dr. Rexene Alberts tomorrow.  Pt understands that although there may be some limitations with this type of visit, we will take all precautions to reduce any security or privacy concerns.  Pt understands that this will be treated like an in office visit and we will file with pt's insurance, and there may be a patient responsible charge related to this service.  Please call pt's cell phone number at [phone].

## 2019-01-03 ENCOUNTER — Ambulatory Visit: Payer: PPO | Admitting: Nurse Practitioner

## 2019-01-03 ENCOUNTER — Ambulatory Visit (INDEPENDENT_AMBULATORY_CARE_PROVIDER_SITE_OTHER): Payer: PPO | Admitting: Neurology

## 2019-01-03 ENCOUNTER — Other Ambulatory Visit: Payer: Self-pay

## 2019-01-03 DIAGNOSIS — Z9989 Dependence on other enabling machines and devices: Secondary | ICD-10-CM

## 2019-01-03 DIAGNOSIS — G4733 Obstructive sleep apnea (adult) (pediatric): Secondary | ICD-10-CM

## 2019-01-03 DIAGNOSIS — R42 Dizziness and giddiness: Secondary | ICD-10-CM | POA: Diagnosis not present

## 2019-01-03 NOTE — Progress Notes (Signed)
Andrew Hayes is a 78 year old right-handed gentleman with an underlying medical history of prostate cancer diagnosed in 1996, hyperlipidemia, colonic polyps, diverticulosis, plantar fasciitis, vertigo and dizziness, with whom I am conducting a phone visit today (see below). We are discussing his sleep apnea diagnosis, and his treatment with CPAP. The patient is unaccompanied today. I last saw him on 12/28/2017, at which time he was compliant with his CPAP, he reported having had vestibular rehabilitation for vertigo and had no recent major vertigo spell thankfully. He was advised to follow-up routinely in one year.  Today, 01/03/2019: Please also see below for phone call visit. I reviewed his CPAP compliance data from 12/03/2018 through 01/01/2019 which is a total of 30 days, during which time he used his machine every night with percent used days greater than 4 hours at 100%, indicating superb compliance with an average usage of 6 hours and 28 minutes, residual AHI borderline at 7.4 per hour, leak on the higher end with the 95th percentile at 23.1 L/m on a pressure of 8 cm.   The patient's allergies, current medications, family history, past medical history, past social history, past surgical history and problem list were reviewed and updated as appropriate.    Previously (copied from previous notes for reference):      I saw him on 05/18/2017 for a sooner than scheduled appointment because of recurrent vertigo symptoms. I suggested a referral to vestibular rehabilitation. He reported about 2 episodes per year of vertigo, lasting a few days at a time. He had some lightheadedness as well, reported no recent falls. She was trying to stay active, played golf, hydrating well.    He called in the interim in December 2018 requesting a referral to [facility] vestibular clinic. I placed a referral for this. He saw ENT at [facility], Dr. Henderson Newcomer on 10/20/2017 and I reviewed the note. He was found to have some hearing  loss. He was suspected to have possible vestibular migraine. He had some cerumen impaction on the right side. He was educated regarding a migraine diet. He was felt to be a candidate for amplification for his bilateral sensorineural hearing loss. He was not particularly concerned about his tinnitus.   I reviewed his CPAP compliance data from 11/27/2017 through 12/26/2017 which is a total of 30 days, during which time he used his CPAP 28 days with percent used days greater than 4 hours at 87%, indicating very good compliance with an average usage of 6 hours and 14 minutes, residual AHI borderline at 6.2 per hour, leak on the higher end with the 95th percentile at 18.1 L/m on a pressure of 8 cm.   I saw him on 12/22/2016, at which time he was compliant with his CPAP. AHI was borderline at 6.3 per hour. He had started seeing Dr. Lorin Mercy for his left shoulder pain, status post injection into the left shoulder. He was worried about his memory and cognitive function, MMSE was 30 out of 30 at the time. He was agreeable to pursuing neuropsychological evaluation. He had a consultation with Dr. Bonita Quin, but opted not to pursue the full cognitive testing at the time.   He called in the interim reporting recurrent bouts of vertigo.   I reviewed his CPAP compliance data from 04/17/2017 through 05/16/2017, which is a total of 30 days, during which time he used his machine every night with percent used days greater than 4 hours at 97%, indicating excellent compliance with an average usage of 6 hours and 31  minutes, residual AHI 4.8 per hour, leak acceptable with the 95th percentile at 13.8 L/m on a pressure of 8 cm.    I reviewed his CPAP compliance data from 11/21/2016 through 12/20/2016, which is a total of 30 days, during which time he used his CPAP every night with percent used days greater than 4 hours at 100%, indicating superb compliance with an average usage of 6 hours and 39 minutes, residual AHI borderline  at 6.3 per hour, leak on the high side for the 95th percentile at 22 L/m on a pressure of 8 cm.    I saw him on 12/23/2015, at which time he was fully compliant with CPAP therapy and reported doing well, had no recent concerns, but was overall still worries about his family history which included Parkinson's disease, NPH, dementia. His exam was stable. I suggested a one-year checkup.   I saw him on 06/18/2015 at which time he was referred by his primary care physician for problems with paresthesias and concern for neuropathy. He reported tingling sensation in both feet. I reviewed Dr. Eddie Dibbles office note from 06/06/2015. Laboratory test results were reviewed from 06/06/2015: CBC with differential was unremarkable with the exception of MCV of 96.4, CMP was unremarkable with the exception of total bilirubin of 1.7, vitamin B12 was 259, TSH normal at 1.53. He described an abnormal sensation at the bottom of his feet but no pain. This was ongoing for 3-4 months at the time. He denied any actual numbness. He had noted no weakness. He had never been told that he had diabetes and was never a heavy alcohol consumer, drinking about 1 beer/day. I suggested we proceed with an EMG and nerve conduction tests. He had this on 07/15/2015: IMPRESSION:   This is a normal study. There was no electrodiagnostic evidence of large fiber peripheral neuropathy or left lumbosacral radiculopathy. But today's test could not rule out the possibility of small fiber neuropathy, if clinically indicated, may consider skin biopsy.   We called him with his test results.   I reviewed his CPAP compliance data from 11/22/2015 through 12/21/2015 which is a total of 30 days during which time he used his machine every night with percent used days greater than 4 hours at 100%, indicating superb compliance with an average usage of 6 hours and 19 minutes, residual AHI 3.4 per hour, leak acceptable with the 95th percentile at 17.4 L/m on a pressure  of 8 cm.   I saw him on 05/23/2015 for a new problem of possible hydrocephalus. The patient is unaccompanied today. At the last visit he reported 2 recent episodes of vertigo. He has a long-standing history of vertigo of over 30 years duration. He reported staying active and drinking enough water. He was drinking one beer per day and reported smoking cessation and about 30 years prior. He was a heavy smoker in the past. We talked about his brain MRI with and without contrast from 05/02/2015: No imaging explanation for vestibular cochlear symptoms as above. Ventriculomegaly. Are there any symptoms of normal pressure hydrocephalus? In addition, I personally reviewed the images through the PACS system and I shared images with him on my computer. He was referred by his ENT doctor, Dr. Janace Hoard who follows him for his Mnire's disease and his vertigo. The patient reported that his memory was not as good. He reported that his older brother died at the age of 77 and was diagnosed with NPH and had a shunt placed. His brother had memory loss  but also did not take care of himself very well per patient. The patient was compliant with CPAP treatment felt well with it.    I saw him on 12/25/2014 for follow-up of his OSA. He was compliant with CPAP therapy at the time.    I reviewed his CPAP compliance data from 04/22/2015 through 05/21/2015 which is a total of 30 days during which time he used his machine 22 days with percent used days greater than 4 hours at 70%, indicating adequate compliance with an average usage of 4 hours and 20 minutes. Residual AHI borderline at 5.8 per hour, leaked low with the 95th percentile at 16.9 L/m on a pressure of 8 cm.     I saw him on 09/19/2014, at which time that he reported trying to be compliant with treatment. He did not do well with nasal pillows. He had trouble tolerating a chinstrap. He had issues with air leaking from the mask. Overall, however, he felt that he was sleeping  better since treatment was started. He denied any significant restless leg symptoms. His balance had improved. For better tolerance of reduce his pressure to 8 cm from 9 cm.     I reviewed his CPAP compliance data from 11/24/2014 through 12/23/2014 which is a total of 30 days during which time he used his machine every night with percent used days greater than 4 hours at 97% indicating excellent compliance with an average usage of 5 hours and 38 minutes, setting at 8 cm with EPR of 1. Residual AHI has increased to 7 per hour but mostly because of central apneas. When we look back at his CPAP titration study results from September 2015, he did have central apneas throughout the study on CPAP then but none with any desaturations. His leak fluctuates in the 95th percentile is at 22.2 L/m at this time. He's using a chinstrap every night. He still does not like it. He worries about his memory but has not had any recent decline. He has a family history of dementia and his older brother. He worries if he should be on a statin. He is on low-dose atorvastatin, 10 mg. He has primarily high triglycerides. He has an appointment with his primary care physician coming up for his yearly checkup. He has seen ENT for vertigo. He has had no recent issues. He does have hearing loss bilaterally.     I first met him on 02/14/2014 at the request of his primary care physician, at which time the patient reported non-restorative sleep, snoring, daytime somnolence and nighttime awakenings, essentially since 1996 (after his prostate cancer surgery). I suggested he return for sleep study. He had a baseline sleep study on 04/18/2014 followed by a CPAP titration study on 06/18/2014 and went over his test results with him in detail today. His baseline sleep study showed a reduced sleep efficiency at 72.8% with a latency to sleep normal at 11 minutes and wake after sleep onset elevated at 92 minutes with mild sleep fragmentation noted. The  arousal index was mildly elevated. He had an increased percentage of stage II sleep, 3.4% of slow-wave sleep, and REM sleep at 19.3% with a normal REM latency of 96.5 minutes. He had no significant PLMS or EKG changes. He had no significant EEG changes. He had mild intermittent snoring. Total AHI was 8.4 per hour, rising to 12.3 per hour during REM sleep and 15.3 per hour in the supine position. Average oxygen saturation was 95%, nadir was 81% in REM  sleep. Based on his sleep related complaints I advised him to return for another sleep study for CPAP titration. His sleep efficiency during the second study was 72.8%. Latency to sleep was normal at 8 minutes and wake after sleep onset was elevated at 91.5 minutes with moderate sleep fragmentation noted. He had a mildly elevated arousal index. He had a normal percentage of light stage sleep, an increased percentage of deep sleep at 29.4% and a mildly reduced percentage of REM sleep at 16.5% with a normal REM latency. He had mild PLMS at 8.5 per hour with an associated arousal index of 4.5 per hour. He had no significant EKG or EEG changes. He was titrated on CPAP from 5-13 cm. AHI was 1.8 per hour at the pressure of 9. Supine REM sleep was achieved. He did indicate sleeping better with CPAP during that night.   I reviewed his CPAP compliance data from 08/14/2014 through 09/12/2014 which is a total of 30 days during which time he used his machine 26 days. Percent used days greater than 4 hours was 83%, indicating fairly good compliance. Average usage was 4 hours and 31 minutes. Pressure at 9 cm. Leak was at times high with the 95th percentile at 27.5 L/m. Average AHI was suboptimal at 7.5 per hour. Looking at the breakdown of his AHI it appears that his central apnea index was elevated at 4.2 per hour.     He had seen Dr. Maxwell Caul for OSA before and had a home sleep test on 10/25/2013 which indicated no significant sleep disorder breathing with an AHI estimated at 3  per hour. Time below 89% saturation was 2 minutes. He reports a bedtime of 11:30 to 11:45 PM, and goes to sleep in minutes, with occasional OTC sleep aid, but not on a night to night basis. He wakes up 1-2 times per night, and sometimes his mind races and he feels it helps to turn his radio on for distraction. He goes to the bathroom 0-1 times per night. His wife's sleep used to disturb him, because of her snoring and restlessness, but they have been sleeping in separate bedrooms for years. He does not wake up with headaches. He snores, but did not used to snore. He has been able to lose weight and plays golf 2 times a week, goes to the gym 2 times per week. He likes fly fishing. He does not wake up rested. His rise time is between 5:30 to 6 AM. He rarely dreams. He is sleepy during the day, but does not take a nap on a scheduled basis. If he lies down in the afternoon, he will go to sleep. His ESS is 14/24 today. He has fallen asleep at the wheel x 2, both of which happened after a long day of fishing on a longer drive home. He has not had a MVA and no dozing off at the wheel in the last 3 years.   There is no report of nighttime reflux, with no nighttime cough experienced. The patient has not noted any RLS symptoms and is not known to kick while asleep or before falling asleep. There is no family history of RLS or OSA. Father was a heavy snorer, mother had some sleep disruption.    He is not a very restless sleeper.    He denies cataplexy, sleep paralysis, hypnagogic or hypnopompic hallucinations, or sleep attacks. He does not report any vivid dreams, nightmares, dream enactments, or parasomnias, such as sleep talking or sleep walking. The  patient has not had an attended sleep study.   He consumes 2 to 3 caffeinated beverages per day, usually in the form of coffee in the morning and ice tea with meals some time.   His bedroom is usually dark and cool. There is no TV in the bedroom and he uses a radio, with  low volume.   He quit smoking in '86 and drinks 1 beer per day.    Virtual Visit via Telephone Note on 01/03/2019: see also above discussion.   I connected with Andrew Hayes on 01/03/19 at  9:30 AM EDT by telephone and verified that I am speaking with the correct person using two identifiers.   I discussed the limitations, risks, security and privacy concerns of performing an evaluation and management service by telephone and the availability of in person appointments. I also discussed with the patient that there may be a patient responsible charge related to this service. The patient expressed understanding and agreed to proceed.   History of Present Illness:  He reports overall doing fairly well, he tracks his CPAP usage on the cell phone app. He is typically up-to-date with supplies, using nasal pillows successfully. His DME company is [facility] [employer], they changed her name recently, he was not aware of their new company name, [employer]. He does not have nocturia, thankfully he has not had any major vertigo spells, his last being vertigo spell that prompted him to be bedbound for several hours was about a year and a half ago or more, nearly 2 years ago. He has noticed intermittent balance issues, has been doing some balance exercises that he learned from physical therapy in the past. He has noticed some fine motor dyscontrol in the right hand, he worries about Parkinson's disease as he has a family history of this. His siblings have passed away. He may have some arthritic changes in his right hand. He has no recurrent or telltale tremor but in the past was noticed by 2 physical therapists that he had an intermittent right hand tremor when they were doing balance exercises. He has been given a prescription for Maxalt by another provider, when he feels vertigo and nausea, on he takes it, he has tried it about 3-4 times and was able to combat any escalation of the vertigo, he believes the  Maxalt has been helpful, he has noticed no side effects from it.   Observations/Objective:  Feels stable, uses CPAP every night, has some right hand stiffness and some right hand fine motor dyscontrol, no telltale resting tremor, reports no recent bout of vertigo.   Assessment and Plan:   In summary, Cipriano Millikan a very pleasant 78 year old malewith an underlying medical history of hyperlipidemia, prostate cancer in 1996, colonic polyps, diverticulosis, plantar fasciitis, recurrent vertigo, cognitive complaints, intermittent paresthesias and obstructive sleep apnea, CPAP therapy, who presents for a follow-up visit for follow-up of his obstructive sleep apnea and balance problems in the past. He feels stable. He is fully compliant with CPAP and indicates ongoing good results, is motivated to continue with treatment, is up-to-date with supplies. I will update the CPAP supply order. His vertigo is stable, thankfully he has not had any major bouts of it and has taken Maxalt as needed and sparingly which he believes has contained his recent vertigo spells and nausea spells. He denies any recurrent headaches. He had been seen at [facility] for vertigo and was diagnosed with vertiginous migraines in the past. He had  neuro rehabilitation for balance training and continues to do some of the exercises to help his balance. He is advised to monitor his right hand fine motor dyscontrol, he is agreeable to this. He has not had any telltale resting tremor thankfully. He has a history of numbness and tingling in his feet. He had an EMG and nerve conduction test in October 2016 which was benign. I suggested we could at some point repeated. He feels stable and does not feel his symptoms have progressed, we will continue to monitor. He has a history of cognitive complaints and concerns about dementia what with his family history but has been stable in that regard. Previously we had pursued memory testing but he decided not  to go through with the full cognitive testing batteries. From my end of things he is advised to continue to pursue healthy lifestyle, exercise on a regular basis, stay active physically and mentally. He tries to hydrate well. I suggested a one-year checkup. I answered all his questions today and he was in agreement.  Follow Up Instructions:  FU one year.   I discussed the assessment and treatment plan with the patient. The patient was provided an opportunity to ask questions and all were answered. The patient agreed with the plan and demonstrated an understanding of the instructions.   The patient was advised to call back or seek an in-person evaluation if the symptoms worsen or if the condition fails to improve as anticipated.  I provided 26 minutes of non-face-to-face time during this encounter.   Star Age, MD

## 2019-01-10 DIAGNOSIS — G4733 Obstructive sleep apnea (adult) (pediatric): Secondary | ICD-10-CM | POA: Diagnosis not present

## 2019-03-11 ENCOUNTER — Emergency Department (HOSPITAL_COMMUNITY)
Admission: EM | Admit: 2019-03-11 | Discharge: 2019-03-11 | Disposition: A | Discharge status: Home / Self Care | Payer: PPO | Attending: Emergency Medicine | Admitting: Emergency Medicine

## 2019-03-11 ENCOUNTER — Other Ambulatory Visit: Payer: Self-pay

## 2019-03-11 ENCOUNTER — Encounter (HOSPITAL_COMMUNITY): Payer: Self-pay

## 2019-03-11 DIAGNOSIS — Z79899 Other long term (current) drug therapy: Secondary | ICD-10-CM | POA: Insufficient documentation

## 2019-03-11 DIAGNOSIS — M791 Myalgia, unspecified site: Secondary | ICD-10-CM | POA: Diagnosis not present

## 2019-03-11 DIAGNOSIS — R509 Fever, unspecified: Secondary | ICD-10-CM | POA: Diagnosis not present

## 2019-03-11 DIAGNOSIS — Z20828 Contact with and (suspected) exposure to other viral communicable diseases: Secondary | ICD-10-CM | POA: Diagnosis not present

## 2019-03-11 DIAGNOSIS — Z87891 Personal history of nicotine dependence: Secondary | ICD-10-CM | POA: Insufficient documentation

## 2019-03-11 DIAGNOSIS — B349 Viral infection, unspecified: Secondary | ICD-10-CM

## 2019-03-11 NOTE — ED Triage Notes (Signed)
Patient reports that he has been having fever and body aches x 2 days. Patient's temp ws 101.4 orally. Patient took Tylenol ES 2 tabs at 0700 today.. Patient's wife was tested for Covid-19 yesterday with results pending. Patient's wife was called by her Ortho physician and told she was exposed.

## 2019-03-11 NOTE — Discharge Instructions (Signed)
Person Under Monitoring Name: Andrew Hayes  Location: [address] [place] [address]   Infection Prevention Recommendations for Individuals Confirmed to have, or Being Evaluated for, 2019 Novel Coronavirus (COVID-19) Infection Who Receive Care at Home  Individuals who are confirmed to have, or are being evaluated for, COVID-19 should follow the prevention steps below until a healthcare provider or local or state health department says they can return to normal activities.  Stay home except to get medical care You should restrict activities outside your home, except for getting medical care. Do not go to work, school, or public areas, and do not use public transportation or taxis.  Call ahead before visiting your doctor Before your medical appointment, call the healthcare provider and tell them that you have, or are being evaluated for, COVID-19 infection. This will help the healthcare providers office take steps to keep other people from getting infected. Ask your healthcare provider to call the local or state health department.  Monitor your symptoms Seek prompt medical attention if your illness is worsening (e.g., difficulty breathing). Before going to your medical appointment, call the healthcare provider and tell them that you have, or are being evaluated for, COVID-19 infection. Ask your healthcare provider to call the local or state health department.  Wear a facemask You should wear a facemask that covers your nose and mouth when you are in the same room with other people and when you visit a healthcare provider. People who live with or visit you should also wear a facemask while they are in the same room with you.  Separate yourself from other people in your home As much as possible, you should stay in a different room from other people in your home. Also, you should use a separate bathroom, if available.  Avoid sharing household items You should  not share dishes, drinking glasses, cups, eating utensils, towels, bedding, or other items with other people in your home. After using these items, you should wash them thoroughly with soap and water.  Cover your coughs and sneezes Cover your mouth and nose with a tissue when you cough or sneeze, or you can cough or sneeze into your sleeve. Throw used tissues in a lined trash can, and immediately wash your hands with soap and water for at least 20 seconds or use an alcohol-based hand rub.  Wash your [facility] your hands often and thoroughly with soap and water for at least 20 seconds. You can use an alcohol-based hand sanitizer if soap and water are not available and if your hands are not visibly dirty. Avoid touching your eyes, nose, and mouth with unwashed hands.   Prevention Steps for Caregivers and Household Members of Individuals Confirmed to have, or Being Evaluated for, COVID-19 Infection Being Cared for in the Home  If you live with, or provide care at home for, a person confirmed to have, or being evaluated for, COVID-19 infection please follow these guidelines to prevent infection:  Follow healthcare providers instructions Make sure that you understand and can help the patient follow any healthcare provider instructions for all care.  Provide for the patients basic needs You should help the patient with basic needs in the home and provide support for getting groceries, prescriptions, and other personal needs.  Monitor the patients symptoms If they are getting sicker, call his or her medical provider and tell them that the patient has, or is being evaluated for, COVID-19 infection. This will help the healthcare  providers office take steps to keep other people from getting infected. Ask the healthcare provider to call the local or state health department.  Limit the number of people who have contact with the patient If possible, have only one caregiver for the  patient. Other household members should stay in another home or place of residence. If this is not possible, they should stay in another room, or be separated from the patient as much as possible. Use a separate bathroom, if available. Restrict visitors who do not have an essential need to be in the home.  Keep older adults, very young children, and other sick people away from the patient Keep older adults, very young children, and those who have compromised immune systems or chronic health conditions away from the patient. This includes people with chronic heart, lung, or kidney conditions, diabetes, and cancer.  Ensure good ventilation Make sure that shared spaces in the home have good air flow, such as from an air conditioner or an opened window, weather permitting.  Wash your hands often Wash your hands often and thoroughly with soap and water for at least 20 seconds. You can use an alcohol based hand sanitizer if soap and water are not available and if your hands are not visibly dirty. Avoid touching your eyes, nose, and mouth with unwashed hands. Use disposable paper towels to dry your hands. If not available, use dedicated cloth towels and replace them when they become wet.  Wear a facemask and gloves Wear a disposable facemask at all times in the room and gloves when you touch or have contact with the patients blood, body fluids, and/or secretions or excretions, such as sweat, saliva, sputum, nasal mucus, vomit, urine, or feces.  Ensure the mask fits over your nose and mouth tightly, and do not touch it during use. Throw out disposable facemasks and gloves after using them. Do not reuse. Wash your hands immediately after removing your facemask and gloves. If your personal clothing becomes contaminated, carefully remove clothing and launder. Wash your hands after handling contaminated clothing. Place all used disposable facemasks, gloves, and other waste in a lined container before  disposing them with other household waste. Remove gloves and wash your hands immediately after handling these items.  Do not share dishes, glasses, or other household items with the patient Avoid sharing household items. You should not share dishes, drinking glasses, cups, eating utensils, towels, bedding, or other items with a patient who is confirmed to have, or being evaluated for, COVID-19 infection. After the person uses these items, you should wash them thoroughly with soap and water.  Wash laundry thoroughly Immediately remove and wash clothes or bedding that have blood, body fluids, and/or secretions or excretions, such as sweat, saliva, sputum, nasal mucus, vomit, urine, or feces, on them. Wear gloves when handling laundry from the patient. Read and follow directions on labels of laundry or clothing items and detergent. In general, wash and dry with the warmest temperatures recommended on the label.  Clean all areas the individual has used often Clean all touchable surfaces, such as counters, tabletops, doorknobs, bathroom fixtures, toilets, phones, keyboards, tablets, and bedside tables, every day. Also, clean any surfaces that may have blood, body fluids, and/or secretions or excretions on them. Wear gloves when cleaning surfaces the patient has come in contact with. Use a diluted bleach solution (e.g., dilute bleach with 1 part bleach and 10 parts water) or a household disinfectant with a label that says EPA-registered for coronaviruses. To make  a bleach solution at home, add 1 tablespoon of bleach to 1 quart (4 cups) of water. For a larger supply, add  cup of bleach to 1 gallon (16 cups) of water. Read labels of cleaning products and follow recommendations provided on product labels. Labels contain instructions for safe and effective use of the cleaning product including precautions you should take when applying the product, such as wearing gloves or eye protection and making sure you  have good ventilation during use of the product. Remove gloves and wash hands immediately after cleaning.  Monitor yourself for signs and symptoms of illness Caregivers and household members are considered close contacts, should monitor their health, and will be asked to limit movement outside of the home to the extent possible. Follow the monitoring steps for close contacts listed on the symptom monitoring form.   ? If you have additional questions, contact your local health department or call the epidemiologist on call at [phone] (available 24/7). ? This guidance is subject to change. For the most up-to-date guidance from [facility], please refer to their website: [email or web address]

## 2019-03-11 NOTE — ED Provider Notes (Addendum)
[facility] DEPT Provider Note   CSN: [id number] Arrival date & time: 03/11/19  1022    History   Chief Complaint Chief Complaint  Patient presents with  . Fever  . Generalized Body Aches    HPI Andrew Hayes is a 78 y.o. male.     78 year old male presents with several days of low-grade temp and body aches.  Denies any dyspnea.  Notes likely positive COVID exposure from his wife.  Denies any weakness.  Patient's last temperature was 101.4 and he self medicate with Tylenol and feels better.  Denies any urinary symptoms.     Past Medical History:  Diagnosis Date  . Actinic keratoses   . Diverticulosis    Mild  . Elevated fasting glucose    Mildly  . Herniated disc   . Junctional nevus   . Lipidemia    Mixed  . Mild sleep apnea   . Organic impotence   . Plantar fasciitis   . Prostate cancer [facility])     Patient Active Problem List   Diagnosis Date Noted  . Impingement syndrome of left shoulder 08/10/2017  . Chronic left shoulder pain 07/21/2017  . Dysphagia 06/09/2015  . History of colonic polyps 06/09/2015  . Dizzy spells 03/08/2012  . Hyperlipidemia 03/08/2012    Past Surgical History:  Procedure Laterality Date  . APPENDECTOMY    . COLONOSCOPY    . ELBOW SURGERY  2013  . KNEE ARTHROSCOPY  2013   knee and elbow  . PROSTATECTOMY  1986  . TONSILLECTOMY          Home Medications    Prior to Admission medications   Medication Sig Start Date End Date Taking? Authorizing Provider  atorvastatin (LIPITOR) 10 MG tablet Take 1 tablet by mouth daily. 04/30/13   [provider]  cholecalciferol (VITAMIN D) 1000 units tablet Take 2,000 Units by mouth daily.    [provider]  co-enzyme Q-10 30 MG capsule Take 30 mg by mouth 2 (two) times daily.     [provider]  ibuprofen (ADVIL,MOTRIN) 200 MG tablet Take 200 mg by mouth every 6 (six) hours as needed.    [provider]  Multiple  Vitamin (MULTI-VITAMIN PO) Take by mouth daily.    [provider]  nitroGLYCERIN (NITRO-DUR) 0.1 mg/hr patch Apply 1/4 patch to affected area 12 hours daily 07/06/18   Hilts, Michael, MD  Omega-3 Fatty Acids (FISH OIL PO) Take by mouth daily.    [provider]  rizatriptan (MAXALT-MLT) 10 MG disintegrating tablet Take 1 tablet (10 mg total) by mouth as needed for migraine. May repeat in 2 hours if needed 08/29/18   Hilts, Legrand Como, MD    Family History Family History  Problem Relation Age of Onset  . Stroke Mother   . Hypertension Mother   . Parkinson's disease Mother   . COPD Father   . Dementia Brother   . Hydrocephalus Brother   . Parkinson's disease Brother   . Parkinson's disease Sister   . Colon cancer Neg Hx     Social History Social History   Tobacco Use  . Smoking status: Former Smoker    Packs/day: 2.00    Years: 24.00    Pack years: 48.00    Types: Cigarettes    Last attempt to quit: 10/11/1984    Years since quitting: 34.4  . Smokeless tobacco: Former Network engineer Use Topics  . Alcohol use: Yes    Alcohol/week:  7.0 standard drinks    Types: 7 Standard drinks or equivalent per week    Comment: 7 beers weekly  . Drug use: No     Allergies   Percocet [oxycodone-acetaminophen]   Review of Systems Review of Systems  All other systems reviewed and are negative.    Physical Exam Updated Vital Signs BP 138/84 (BP Location: Right Arm)   Pulse 70   Temp 98.6 F (37 C) (Oral)   Resp 16   Ht 1.803 m (5\' 11" )   Wt 83 kg   SpO2 100%   BMI 25.52 kg/m   Physical Exam Vitals signs and nursing note reviewed.  Constitutional:      General: He is not in acute distress.    Appearance: Normal appearance. He is well-developed. He is not toxic-appearing.  HENT:     Head: Normocephalic and atraumatic.  Eyes:     General: Lids are normal.     Conjunctiva/sclera: Conjunctivae normal.     Pupils: Pupils are equal, round, and reactive to  light.  Neck:     Musculoskeletal: Normal range of motion and neck supple.     Thyroid: No thyroid mass.     Trachea: No tracheal deviation.  Cardiovascular:     Rate and Rhythm: Normal rate and regular rhythm.     Heart sounds: Normal heart sounds. No murmur. No gallop.   Pulmonary:     Effort: Pulmonary effort is normal. No respiratory distress.     Breath sounds: Normal breath sounds. No stridor. No decreased breath sounds, wheezing, rhonchi or rales.  Abdominal:     General: Bowel sounds are normal. There is no distension.     Palpations: Abdomen is soft.     Tenderness: There is no abdominal tenderness. There is no rebound.  Musculoskeletal: Normal range of motion.        General: No tenderness.  Skin:    General: Skin is warm and dry.     Findings: No abrasion or rash.  Neurological:     Mental Status: He is alert and oriented to person, place, and time.     GCS: GCS eye subscore is 4. GCS verbal subscore is 5. GCS motor subscore is 6.     Cranial Nerves: No cranial nerve deficit.     Sensory: No sensory deficit.  Psychiatric:        Speech: Speech normal.        Behavior: Behavior normal.      ED Treatments / Results  Labs (all labs ordered are listed, but only abnormal results are displayed) Labs Reviewed  NOVEL CORONAVIRUS, NAA (HOSPITAL ORDER, SEND-OUT TO REF LAB)    EKG None  Radiology No results found.  Procedures Procedures (including critical care time)  Medications Ordered in ED Medications - No data to display   Initial Impression / Assessment and Plan / ED Course  I have reviewed the triage vital signs and the nursing notes.  Pertinent labs & imaging results that were available during my care of the patient were reviewed by me and considered in my medical decision making (see chart for details).        Patient was stable pulse oximetry here and he is not dyspneic.  He has no evidence of respiratory distress.  I have ordered the send out  COVID test and will give the patient pui instructions  [name] was evaluated in Emergency Department on 03/11/2019 for the symptoms described in the history of  present illness. He was evaluated in the context of the global COVID-19 pandemic, which necessitated consideration that the patient might be at risk for infection with the SARS-CoV-2 virus that causes COVID-19. Institutional protocols and algorithms that pertain to the evaluation of patients at risk for COVID-19 are in a state of rapid change based on information released by regulatory bodies including the CDC and federal and state organizations. These policies and algorithms were followed during the patient's care in the ED.   Final Clinical Impressions(s) / ED Diagnoses   Final diagnoses:  None    ED Discharge Orders    None       Lacretia Leigh, MD 03/11/19 1140    Lacretia Leigh, MD 03/11/19 1140

## 2019-03-13 LAB — NOVEL CORONAVIRUS, NAA (HOSP ORDER, SEND-OUT TO REF LAB; TAT 18-24 HRS): SARS-CoV-2, NAA: NOT DETECTED

## 2019-03-14 DIAGNOSIS — R509 Fever, unspecified: Secondary | ICD-10-CM | POA: Diagnosis not present

## 2019-04-04 DIAGNOSIS — Z0000 Encounter for general adult medical examination without abnormal findings: Secondary | ICD-10-CM | POA: Diagnosis not present

## 2019-04-04 DIAGNOSIS — Z125 Encounter for screening for malignant neoplasm of prostate: Secondary | ICD-10-CM | POA: Diagnosis not present

## 2019-04-04 DIAGNOSIS — G4730 Sleep apnea, unspecified: Secondary | ICD-10-CM | POA: Diagnosis not present

## 2019-04-04 DIAGNOSIS — R7301 Impaired fasting glucose: Secondary | ICD-10-CM | POA: Diagnosis not present

## 2019-04-04 DIAGNOSIS — R748 Abnormal levels of other serum enzymes: Secondary | ICD-10-CM | POA: Diagnosis not present

## 2019-04-04 DIAGNOSIS — Z8719 Personal history of other diseases of the digestive system: Secondary | ICD-10-CM | POA: Diagnosis not present

## 2019-04-04 DIAGNOSIS — G43109 Migraine with aura, not intractable, without status migrainosus: Secondary | ICD-10-CM | POA: Diagnosis not present

## 2019-04-04 DIAGNOSIS — E7800 Pure hypercholesterolemia, unspecified: Secondary | ICD-10-CM | POA: Diagnosis not present

## 2019-04-04 DIAGNOSIS — L570 Actinic keratosis: Secondary | ICD-10-CM | POA: Diagnosis not present

## 2019-04-04 DIAGNOSIS — Z86010 Personal history of colonic polyps: Secondary | ICD-10-CM | POA: Diagnosis not present

## 2019-04-04 DIAGNOSIS — Z8546 Personal history of malignant neoplasm of prostate: Secondary | ICD-10-CM | POA: Diagnosis not present

## 2019-04-11 DIAGNOSIS — R509 Fever, unspecified: Secondary | ICD-10-CM | POA: Diagnosis not present

## 2019-04-11 DIAGNOSIS — W57XXXA Bitten or stung by nonvenomous insect and other nonvenomous arthropods, initial encounter: Secondary | ICD-10-CM | POA: Diagnosis not present

## 2019-04-19 DIAGNOSIS — G43109 Migraine with aura, not intractable, without status migrainosus: Secondary | ICD-10-CM | POA: Diagnosis not present

## 2019-04-19 DIAGNOSIS — A6920 Lyme disease, unspecified: Secondary | ICD-10-CM | POA: Diagnosis not present

## 2019-04-24 ENCOUNTER — Telehealth: Payer: Self-pay | Admitting: Neurology

## 2019-04-24 NOTE — Telephone Encounter (Signed)
That's fine. thanks

## 2019-04-24 NOTE — Telephone Encounter (Signed)
I am fine with the switch.

## 2019-04-24 NOTE — Telephone Encounter (Signed)
Good afternoon. We've received a new referral on patient for vestibular migraines. He said he loves Dr. Rexene Alberts, but would prefer to see a headache specialist for this particular issue. Would you both be ok with this?

## 2019-05-15 ENCOUNTER — Telehealth: Payer: Self-pay | Admitting: *Deleted

## 2019-05-15 NOTE — Telephone Encounter (Signed)
Spoke with pt. MD [facility] tomorrow 8/5. Appt r/s to Mon 8/10 @ 8:30 arrival 15-30 minutes prior. Pt verbalized appreciation. Understands to bring mask to appt and that he will be screened and door and temp taken d/t COVID19.

## 2019-05-16 ENCOUNTER — Institutional Professional Consult (permissible substitution): Payer: PPO | Admitting: Neurology

## 2019-05-21 ENCOUNTER — Ambulatory Visit (INDEPENDENT_AMBULATORY_CARE_PROVIDER_SITE_OTHER): Payer: PPO | Admitting: Neurology

## 2019-05-21 ENCOUNTER — Encounter: Payer: Self-pay | Admitting: Neurology

## 2019-05-21 ENCOUNTER — Telehealth (HOSPITAL_COMMUNITY): Payer: Self-pay | Admitting: *Deleted

## 2019-05-21 ENCOUNTER — Other Ambulatory Visit: Payer: Self-pay

## 2019-05-21 ENCOUNTER — Telehealth: Payer: Self-pay | Admitting: Neurology

## 2019-05-21 VITALS — BP 142/78 | HR 60 | Temp 96.8°F | Ht 71.0 in | Wt 184.0 lb

## 2019-05-21 DIAGNOSIS — R270 Ataxia, unspecified: Secondary | ICD-10-CM | POA: Diagnosis not present

## 2019-05-21 DIAGNOSIS — R260 Ataxic gait: Secondary | ICD-10-CM

## 2019-05-21 DIAGNOSIS — H81399 Other peripheral vertigo, unspecified ear: Secondary | ICD-10-CM

## 2019-05-21 DIAGNOSIS — A881 Epidemic vertigo: Secondary | ICD-10-CM

## 2019-05-21 DIAGNOSIS — G459 Transient cerebral ischemic attack, unspecified: Secondary | ICD-10-CM

## 2019-05-21 DIAGNOSIS — R2689 Other abnormalities of gait and mobility: Secondary | ICD-10-CM | POA: Diagnosis not present

## 2019-05-21 DIAGNOSIS — R42 Dizziness and giddiness: Secondary | ICD-10-CM

## 2019-05-21 MED ORDER — MECLIZINE HCL 25 MG PO TABS: 25.0000 mg | ORAL_TABLET | Freq: Four times a day (QID) | ORAL | 6 refills | Status: DC | PRN

## 2019-05-21 NOTE — Addendum Note (Signed)
Addended by: Sarina Ill B on: 05/21/2019 09:31 AM   Modules accepted: Orders

## 2019-05-21 NOTE — Progress Notes (Addendum)
[facility]    Provider:  Dr [name] Requesting Provider: [name], MD Primary Care Provider:  [name], MD  CC:  Vestibular migraine  HPI:  Andrew Hayes is a 78 y.o. male here as requested by [name], MD for dizziness. He was 78 years old with the first episode of vertigo, he has to lay down, a feeling of spinning and if he doesn't lay down perfectly flat keep his head on the bed he gets nauseated. Worst one was 4 years ago he was in bed for days and every time he got up he vomited. He has been evaluated by [facility] and multiple ENTs and diagnosed with vestibular migraines. He will episodes are a little dizziness, he takes rizatriptan and it does get very bad after that, but he has a "hangover" and may not feel good for a few days but he can avoid the severe vertigo and vomiting. He is getting these "little" episodes more frequently 2x a month that last for 4-5 days. No inciting events, he has evaluated all possible triggers and none found. He even tried a migraine diet for 3-4 months recommended by [facility] and didn;t help. He never gets headaches with the episodes. He has been to vestibular therapy. He drinks enough water, he has had brain MRIs, He is having more frequent episodes, he feels his balance is impaired and sometimes he does not walk well, hearing changes. No Fhx of migraines.  Reviewed notes, labs and imaging from outside physicians, which showed:  IMPRESSION: MRI brain 2016 personally reviewed images 1. No imaging explanation for vestibular cochlear symptoms, as above. 2. Ventriculomegaly. Are there any symptoms of normal pressure hydrocephalus  Review of Systems: Patient complains of symptoms per HPI as well as the following symptoms: no falls. Pertinent negatives and positives per HPI. All others negative.   Social History   Socioeconomic History  . Marital status: Married    Spouse name: Hassan Rowan  . Number of children: 2  . Years of  education: 37  . Highest education level: Not on file  Occupational History  . Occupation: cpa    Employer: RETIRED    Comment: retired  Scientific laboratory technician  . Financial resource strain: Not on file  . Food insecurity    Worry: Not on file    Inability: Not on file  . Transportation needs    Medical: Not on file    Non-medical: Not on file  Tobacco Use  . Smoking status: Former Smoker    Packs/day: 2.00    Years: 24.00    Pack years: 48.00    Types: Cigarettes    Quit date: 10/11/1984    Years since quitting: 34.6  . Smokeless tobacco: Former Systems developer  . Tobacco comment: briefly used smokeless tobacco  Substance and Sexual Activity  . Alcohol use: Yes    Alcohol/week: 7.0 standard drinks    Types: 7 Standard drinks or equivalent per week    Comment: 7 beers weekly  . Drug use: No  . Sexual activity: Not on file  Lifestyle  . Physical activity    Days per week: Not on file    Minutes per session: Not on file  . Stress: Not on file  Relationships  . Social Herbalist on phone: Not on file    Gets together: Not on file    Attends religious service: Not on file    Active member of club or organization: Not on file  Attends meetings of clubs or organizations: Not on file    Relationship status: Not on file  . Intimate partner violence    Fear of current or ex partner: Not on file    Emotionally abused: Not on file    Physically abused: Not on file    Forced sexual activity: Not on file  Other Topics Concern  . Not on file  Social History Narrative   Patient lives at home with family.   Patient consumes 2-3 cups of caffeine daily,right handed.   2 children (1 deceased)    Family History  Problem Relation Age of Onset  . Stroke Mother   . Hypertension Mother   . Parkinson's disease Mother   . COPD Father   . Dementia Brother   . Hydrocephalus Brother   . Parkinson's disease Brother   . Parkinson's disease Sister   . Colon cancer Neg Hx   . Migraines Neg Hx         none to his knowledge    Past Medical History:  Diagnosis Date  . Actinic keratoses   . Diverticulosis    Mild  . Elevated fasting glucose    Mildly  . Herniated disc   . Junctional nevus   . Lipidemia    Mixed  . Lyme disease 2020   pt placed on abx and stated he stopped taking vitamins so as not to interfere.  . Mild sleep apnea   . Organic impotence   . Plantar fasciitis   . Prostate cancer [facility])     Patient Active Problem List   Diagnosis Date Noted  . Impingement syndrome of left shoulder 08/10/2017  . Chronic left shoulder pain 07/21/2017  . Dysphagia 06/09/2015  . History of colonic polyps 06/09/2015  . Dizzy spells 03/08/2012  . Hyperlipidemia 03/08/2012    Past Surgical History:  Procedure Laterality Date  . APPENDECTOMY    . COLONOSCOPY    . ELBOW SURGERY  2013  . KNEE ARTHROSCOPY  2013   knee and elbow  . PROSTATECTOMY  1986  . TONSILLECTOMY      Current Outpatient Medications  Medication Sig Dispense Refill  . atorvastatin (LIPITOR) 10 MG tablet Take 1 tablet by mouth daily.    Marland Kitchen ibuprofen (ADVIL,MOTRIN) 200 MG tablet Take 400 mg by mouth every 6 (six) hours as needed.     . rizatriptan (MAXALT-MLT) 10 MG disintegrating tablet Take 1 tablet (10 mg total) by mouth as needed for migraine. May repeat in 2 hours if needed 10 tablet 6  . cholecalciferol (VITAMIN D) 1000 units tablet Take 2,000 Units by mouth daily.    Marland Kitchen co-enzyme Q-10 30 MG capsule Take 30 mg by mouth 2 (two) times daily.     . meclizine (ANTIVERT) 25 MG tablet Take 1 tablet (25 mg total) by mouth every 6 (six) hours as needed for dizziness or nausea. 30 tablet 6  . Multiple Vitamin (MULTI-VITAMIN PO) Take by mouth daily.    . Omega-3 Fatty Acids (FISH OIL PO) Take by mouth daily.     No current facility-administered medications for this visit.     Allergies as of 05/21/2019 - Review Complete 05/21/2019  Allergen Reaction Noted  . Percocet [oxycodone-acetaminophen] Other (See  Comments) 05/23/2015    Vitals: BP (!) 142/78 (BP Location: Right Arm, Patient Position: Sitting)   Pulse 60 Comment: taken by staff  Temp (!) 96.8 F (36 C) Comment: taken by check-in staff  Ht 5\' 11"  (1.803 m)  Wt 184 lb (83.5 kg)   BMI 25.66 kg/m  Last Weight:  Wt Readings from Last 1 Encounters:  05/21/19 184 lb (83.5 kg)   Last Height:   Ht Readings from Last 1 Encounters:  05/21/19 5\' 11"  (1.803 m)     Physical exam: Exam: Gen: NAD, conversant, well nourised, obese, well groomed                     CV: RRR, no MRG. No Carotid Bruits. No peripheral edema, warm, nontender Eyes: Conjunctivae clear without exudates or hemorrhage  Neuro: Detailed Neurologic Exam  Speech:    Speech is normal; fluent and spontaneous with normal comprehension.  Cognition:    The patient is oriented to person, place, and time;     recent and remote memory intact;     language fluent;     normal attention, concentration,     fund of knowledge Cranial Nerves:    The pupils are equal, round, and reactive to light. The fundi are normal and spontaneous venous pulsations are present. Visual fields are full to finger confrontation. Extraocular movements are intact. Trigeminal sensation is intact and the muscles of mastication are normal. The face is symmetric. The palate elevates in the midline. Hearing intact. Voice is normal. Shoulder shrug is normal. The tongue has normal motion without fasciculations.   Coordination:    Normal finger to nose and heel to shin. Normal rapid alternating movements.   Gait:    Heel-toe and tandem gait are normal.   Motor Observation:    No asymmetry, no atrophy, and no involuntary movements noted. Tone:    Normal muscle tone.    Posture:    Posture is normal. normal erect    Strength:    Strength is V/V in the upper and lower limbs.      Sensation: intact to LT     Reflex Exam:  DTR's:    Deep tendon reflexes in the upper and lower extremities  are normal bilaterally.   Toes:    The toes are downgoing bilaterally.   Clonus:    Clonus is absent.    Assessment/Plan:  Patients with episodes of dizziness and vertigo. He has been extensively evaluated by ENT and possibly vestibula rmigraines however does not fit with this diagnosis (never had headache which is often present with at least some of the dizzy episodes). Also need to check brain for strokes, schwannomas, vascular etiologies, carotid stenosis.   - MRI of the brain with IAC protocol - Please schedule an appointment when symptomatic. We will have to be very flexible and get him into the office within a day or two of calling - Can continue maxalt but also try meclizine (can take together) - He prefers not to start a daily preventative   Orders Placed This Encounter  Procedures  . MR BRAIN W WO CONTRAST  . MR ANGIO HEAD WO CONTRAST  . Basic Metabolic Panel   Meds ordered this encounter  Medications  . meclizine (ANTIVERT) 25 MG tablet    Sig: Take 1 tablet (25 mg total) by mouth every 6 (six) hours as needed for dizziness or nausea.    Dispense:  30 tablet    Refill:  6    Cc: [name], MD  Sarina Ill, MD  [facility] [address] [place], [place] [address]  Phone [phone] Fax [phone]

## 2019-05-21 NOTE — Patient Instructions (Addendum)
MRI of the brain and MRA of the head (brain and blood vessels) A lab today Continue Maxalt as needed up to 2x a day (Rizatriptan) Try meclizine for vertigo   Vertigo Vertigo is the feeling that you or your surroundings are moving when they are not. This feeling can come and go at any time. Vertigo often goes away on its own. Vertigo can be dangerous if it occurs while you are doing something that could endanger you or others, such as driving or operating machinery. Your health care provider will do tests to determine the cause of your vertigo. Tests will also help your health care provider decide how best to treat your condition. Follow these instructions at home: Eating and drinking      Drink enough fluid to keep your urine pale yellow.  Do not drink alcohol. Activity  Return to your normal activities as told by your health care provider. Ask your health care provider what activities are safe for you.  In the morning, first sit up on the side of the bed. When you feel okay, stand slowly while you hold onto something until you know that your balance is fine.  Move slowly. Avoid sudden body or head movements or certain positions, as told by your health care provider.  If you have trouble walking or keeping your balance, try using a cane for stability. If you feel dizzy or unstable, sit down right away.  Avoid doing any tasks that would cause danger to you or others if vertigo occurs.  Avoid bending down if you feel dizzy. Place items in your home so that they are easy for you to reach without leaning over.  Do not drive or use heavy machinery if you feel dizzy. General instructions  Take over-the-counter and prescription medicines only as told by your health care provider.  Keep all follow-up visits as told by your health care provider. This is important. Contact a health care provider if:  Your medicines do not relieve your vertigo or they make it worse.  You have a fever.   Your condition gets worse or you develop new symptoms.  Your family or friends notice any behavioral changes.  Your nausea or vomiting gets worse.  You have numbness or a prickling and tingling sensation in part of your body. Get help right away if you:  Have difficulty moving or speaking.  Are always dizzy.  Faint.  Develop severe headaches.  Have weakness in your hands, arms, or legs.  Have changes in your hearing or vision.  Develop a stiff neck.  Develop sensitivity to light. Summary  Vertigo is the feeling that you or your surroundings are moving when they are not.  Your health care provider will do tests to determine the cause of your vertigo.  Follow instructions for home care. You may be told to avoid certain tasks, positions, or movements.  Contact a health care provider if your medicines do not relieve your symptoms, or if you have a fever, nausea, vomiting, or changes in behavior.  Get help right away if you have severe headaches or difficulty speaking, or you develop hearing or vision problems. This information is not intended to replace advice given to you by your health care provider. Make sure you discuss any questions you have with your health care provider. Document Released: 07/07/2005 Document Revised: 08/21/2018 Document Reviewed: 08/21/2018 Elsevier Patient Education  [place] tablets or capsules What is this medicine? MECLIZINE (MEK li zeen) is  an antihistamine. It is used to prevent nausea, vomiting, or dizziness caused by motion sickness. It is also used to prevent and treat vertigo (extreme dizziness or a feeling that you or your surroundings are tilting or spinning around). This medicine may be used for other purposes; ask your health care provider or pharmacist if you have questions. COMMON BRAND NAME(S): Antivert, Dramamine Less Drowsy, Dramamine-N, Medivert, Meni-D What should I tell my health care provider before I  take this medicine? They need to know if you have any of these conditions:  glaucoma  lung or breathing disease, like asthma  problems urinating  prostate disease  stomach or intestine problems  an unusual or allergic reaction to meclizine, other medicines, foods, dyes, or preservatives  pregnant or trying to get pregnant  breast-feeding How should I use this medicine? Take this medicine by mouth with a glass of water. Follow the directions on the prescription label. If you are using this medicine to prevent motion sickness, take the dose at least 1 hour before travel. If it upsets your stomach, take it with food or milk. Take your doses at regular intervals. Do not take your medicine more often than directed. Talk to your pediatrician regarding the use of this medicine in children. Special care may be needed. Overdosage: If you think you have taken too much of this medicine contact a poison control center or emergency room at once. NOTE: This medicine is only for you. Do not share this medicine with others. What if I miss a dose? If you miss a dose, take it as soon as you can. If it is almost time for your next dose, take only that dose. Do not take double or extra doses. What may interact with this medicine? Do not take this medicine with any of the following medications:  MAOIs like Carbex, Eldepryl, Marplan, Nardil, and Parnate This medicine may also interact with the following medications:  alcohol  antihistamines for allergy, cough and cold  certain medicines for anxiety or sleep  certain medicines for depression, like amitriptyline, fluoxetine, sertraline  certain medicines for seizures like phenobarbital, primidone  general anesthetics like halothane, isoflurane, methoxyflurane, propofol  local anesthetics like lidocaine, pramoxine, tetracaine  medicines that relax muscles for surgery  narcotic medicines for pain  phenothiazines like chlorpromazine,  mesoridazine, prochlorperazine, thioridazine This list may not describe all possible interactions. Give your health care provider a list of all the medicines, herbs, non-prescription drugs, or dietary supplements you use. Also tell them if you smoke, drink alcohol, or use illegal drugs. Some items may interact with your medicine. What should I watch for while using this medicine? Tell your doctor or healthcare professional if your symptoms do not start to get better or if they get worse. You may get drowsy or dizzy. Do not drive, use machinery, or do anything that needs mental alertness until you know how this medicine affects you. Do not stand or sit up quickly, especially if you are an older patient. This reduces the risk of dizzy or fainting spells. Alcohol may interfere with the effect of this medicine. Avoid alcoholic drinks. Your mouth may get dry. Chewing sugarless gum or sucking hard candy, and drinking plenty of water may help. Contact your doctor if the problem does not go away or is severe. This medicine may cause dry eyes and blurred vision. If you wear contact lenses you may feel some discomfort. Lubricating drops may help. See your eye doctor if the problem does not go away  or is severe. What side effects may I notice from receiving this medicine? Side effects that you should report to your doctor or health care professional as soon as possible:  feeling faint or lightheaded, falls  fast, irregular heartbeat Side effects that usually do not require medical attention (report to your doctor or health care professional if they continue or are bothersome):  constipation  headache  trouble passing urine or change in the amount of urine  trouble sleeping  upset stomach This list may not describe all possible side effects. Call your doctor for medical advice about side effects. You may report side effects to FDA at 1-800-FDA-1088. Where should I keep my medicine? Keep out of the  reach of children. Store at room temperature between 15 and 30 degrees C (59 and 86 degrees F). Keep container tightly closed. Throw away any unused medicine after the expiration date. NOTE: This sheet is a summary. It may not cover all possible information. If you have questions about this medicine, talk to your doctor, pharmacist, or health care provider.  2020 Elsevier/Gold Standard (2015-10-29 19:41:02)

## 2019-05-21 NOTE — Telephone Encounter (Signed)
health team order sent to GI. No auth they will reach out to the patient to schedule.  

## 2019-05-21 NOTE — Telephone Encounter (Signed)
The above patient or their representative was contacted and gave the following answers to these questions:         Do you have any of the following symptoms?  no  Fever                    Cough                   Shortness of breath  Do  you have any of the following other symptoms? no   muscle pain         vomiting,        diarrhea        rash         weakness        red eye        abdominal pain         bruising          bruising or bleeding              joint pain           severe headache    Have you been in contact with someone who was or has been sick in the past 2 weeks? no  Yes                 Unsure                         Unable to assess   Does the person that you were in contact with have any of the following symptoms?   Cough         shortness of breath           muscle pain         vomiting,            diarrhea            rash            weakness           fever            red eye           abdominal pain           bruising  or  bleeding                joint pain                severe headache               Have you  or someone you have been in contact with traveled internationally in th last month?         If yes, which countries?   Have you  or someone you have been in contact with traveled outside [place] in th last month?         If yes, which state and city?   COMMENTS OR ACTION PLAN FOR THIS PATIENT:          

## 2019-05-22 LAB — BASIC METABOLIC PANEL
BUN/Creatinine Ratio: 21 (ref 10–24)
BUN: 21 mg/dL (ref 8–27)
CO2: 23 mmol/L (ref 20–29)
Calcium: 9.6 mg/dL (ref 8.6–10.2)
Chloride: 106 mmol/L (ref 96–106)
Creatinine, Ser: 0.98 mg/dL (ref 0.76–1.27)
GFR calc Af Amer: 87 mL/min/{1.73_m2} (ref >59)
GFR calc non Af Amer: 75 mL/min/{1.73_m2} (ref >59)
Glucose: 91 mg/dL (ref 65–99)
Potassium: 4.3 mmol/L (ref 3.5–5.2)
Sodium: 143 mmol/L (ref 134–144)

## 2019-05-23 ENCOUNTER — Other Ambulatory Visit: Payer: Self-pay

## 2019-05-23 ENCOUNTER — Telehealth: Payer: Self-pay | Admitting: Neurology

## 2019-05-23 ENCOUNTER — Ambulatory Visit (HOSPITAL_COMMUNITY)
Admission: RE | Admit: 2019-05-23 | Discharge: 2019-05-23 | Disposition: A | Discharge status: Home / Self Care | Payer: PPO | Source: Ambulatory Visit | Attending: Family | Admitting: Family

## 2019-05-23 DIAGNOSIS — G459 Transient cerebral ischemic attack, unspecified: Secondary | ICD-10-CM | POA: Insufficient documentation

## 2019-05-23 DIAGNOSIS — R42 Dizziness and giddiness: Secondary | ICD-10-CM | POA: Diagnosis not present

## 2019-05-23 NOTE — Telephone Encounter (Signed)
-----   Message from Melvenia Beam, MD sent at 05/22/2019  5:25 PM EDT ----- Labs normal thanks

## 2019-05-23 NOTE — Telephone Encounter (Signed)
Called the pt to inform him of the results from lab work as well as his carotid Ultrasound. There was no answer. LVM for the pt to call back  **When patient calls back please advise the patient that Dr [name] reviewed the lab work and it was normal as well as his ultrasound that was completed on his neck that looks at the carotid arteries were also normal.  Dr [name] didn't see anything that looks of clinical concern.    "Carotid Doppler studies are normal, thanks "

## 2019-05-24 NOTE — Telephone Encounter (Signed)
I spoke with the pt's wife Hassan Rowan (on [place]) d/t pt not being available. She verbalized understanding that carotid doppler study was normal and pt's labs were normal. She will give the message to the pt. I advised if the pt has any questions to give [place] a call back. She verbalized appreciation.

## 2019-05-28 ENCOUNTER — Ambulatory Visit (INDEPENDENT_AMBULATORY_CARE_PROVIDER_SITE_OTHER): Payer: PPO | Admitting: Internal Medicine

## 2019-05-28 ENCOUNTER — Encounter: Payer: Self-pay | Admitting: Internal Medicine

## 2019-05-28 ENCOUNTER — Other Ambulatory Visit: Payer: Self-pay

## 2019-05-28 DIAGNOSIS — R509 Fever, unspecified: Secondary | ICD-10-CM | POA: Diagnosis not present

## 2019-05-28 NOTE — Progress Notes (Signed)
[place] for Infectious Disease      Reason for Consult: concern for Lyme disease    Referring Physician: Dr. Rex Kras    Patient ID: Andrew Hayes, male    DOB: 06-Nov-1940, 77 y.o.   MRN: [id number]  HPI:   Here for evaluation for positive IgM for Lyme disease, IgG negative.  He encountered what he says was a deer tick in May of this year and about 2 weeks after that developed fever and chills.  No particular rash, though did note mild redness around the tick bite site. This occurred in [place].  He has not been to a Lyme endemic area.  He had no headache, no other rash.  No associated joint swelling.  No sick contacts otherwise.  Fever and chills resolved at the time.  He went to his PCP in July and was tested for Lyme disease with only the IgM positive.  His main concern now is the possibility of chronic symptoms related to "chronic Lyme disease" based on his reading on the internet.   Previous record reviewed from his PCP including labs with negative IgG.    Past Medical History:  Diagnosis Date  . Actinic keratoses   . Diverticulosis    Mild  . Elevated fasting glucose    Mildly  . Herniated disc   . Junctional nevus   . Lipidemia    Mixed  . Lyme disease 2020   pt placed on abx and stated he stopped taking vitamins so as not to interfere.  . Mild sleep apnea   . Organic impotence   . Plantar fasciitis   . Prostate cancer [facility])     Prior to Admission medications   Medication Sig Start Date End Date Taking? Authorizing Provider  atorvastatin (LIPITOR) 10 MG tablet Take 1 tablet by mouth daily. 04/30/13  Yes [provider]  cholecalciferol (VITAMIN D) 1000 units tablet Take 2,000 Units by mouth daily.   Yes [provider]  rizatriptan (MAXALT-MLT) 10 MG disintegrating tablet Take 1 tablet (10 mg total) by mouth as needed for migraine. May repeat in 2 hours if needed 08/29/18  Yes Hilts, Michael, MD  co-enzyme Q-10 30 MG capsule Take 30 mg by mouth 2  (two) times daily.     [provider]  ibuprofen (ADVIL,MOTRIN) 200 MG tablet Take 400 mg by mouth every 6 (six) hours as needed.     [provider]  meclizine (ANTIVERT) 25 MG tablet Take 1 tablet (25 mg total) by mouth every 6 (six) hours as needed for dizziness or nausea. Patient not taking: Reported on 05/28/2019 05/21/19   Melvenia Beam, MD  Multiple Vitamin (MULTI-VITAMIN PO) Take by mouth daily.    [provider]  Omega-3 Fatty Acids (FISH OIL PO) Take by mouth daily.    [provider]    Allergies  Allergen Reactions  . Percocet [Oxycodone-Acetaminophen] Other (See Comments)    Patient states he is unable to tolerate    Social History   Tobacco Use  . Smoking status: Former Smoker    Packs/day: 2.00    Years: 24.00    Pack years: 48.00    Types: Cigarettes    Quit date: 10/11/1984    Years since quitting: 34.6  . Smokeless tobacco: Former Systems developer  . Tobacco comment: briefly used smokeless tobacco  Substance Use Topics  . Alcohol use: Yes    Alcohol/week: 7.0 standard drinks    Types: 7 Standard drinks  or equivalent per week    Comment: 7 beers weekly  . Drug use: No    Family History  Problem Relation Age of Onset  . Stroke Mother   . Hypertension Mother   . Parkinson's disease Mother   . COPD Father   . Dementia Brother   . Hydrocephalus Brother   . Parkinson's disease Brother   . Parkinson's disease Sister   . Colon cancer Neg Hx   . Migraines Neg Hx        none to his knowledge    Review of Systems  Constitutional: negative for fevers, chills, malaise, anorexia and weight loss Gastrointestinal: negative for diarrhea Integument/breast: positive for small rash on his right index finger Musculoskeletal: negative for myalgias and arthralgias All other systems reviewed and are negative    Constitutional: in no apparent distress  Vitals:   05/28/19 0841  BP: (!) 176/76  Pulse: (!) 57  Temp: 98 F (36.7 C)   EYES:  anicteric Skin: small dried area on right index finger  Labs: No results found for: WBC, HGB, HCT, MCV, PLT  Lab Results  Component Value Date   CREATININE 0.98 05/21/2019   BUN 21 05/21/2019   NA 143 05/21/2019   K 4.3 05/21/2019   CL 106 05/21/2019   CO2 23 05/21/2019   No results found for: ALT, AST, GGT, ALKPHOS, BILITOT, INR   Assessment: fever, chills.  I discussed Lyme disease at length with Andrew Hayes.  I discussed the controversies of 'chronic Lyme disease' and lack of diagnostic criteria or understanding of any long term manifestations of treated lyme disease and likelihood chronic symptoms being related to other causes.  I discussed the stages of lyme disease in those that are untreated including early, mid and late symptoms, such as large joint septic arthritis, meningitis, but that is related to untreated infection.  I also discussed the very low probability of obtaining lyme disease in [place] with an extremely (if not actually 0) low prevalence of lyme here.  I discussed other tick-related infections, though not typical of deer tick certainly most tick related infections are caused without known bites.  Regardless, he has been adequately treated for RMSF, erlichiosis, STARI, etc.... I also discussed the lab test and high probability of false positive IgM, particularly in someone with a very low pretest probability. Patient voiced his understanding and thankful for the information.    Plan: 1) supportive care 45 minutes spent including 25 minutes of face to face discussion of above

## 2019-05-30 ENCOUNTER — Ambulatory Visit: Payer: PPO | Admitting: Internal Medicine

## 2019-06-01 DIAGNOSIS — G4733 Obstructive sleep apnea (adult) (pediatric): Secondary | ICD-10-CM | POA: Diagnosis not present

## 2019-06-13 DIAGNOSIS — Z8619 Personal history of other infectious and parasitic diseases: Secondary | ICD-10-CM | POA: Diagnosis not present

## 2019-06-19 ENCOUNTER — Other Ambulatory Visit: Payer: PPO

## 2019-06-19 ENCOUNTER — Telehealth: Payer: Self-pay

## 2019-06-19 NOTE — Telephone Encounter (Signed)
Patient states that he was told by Dr. [name] to call the next time he was having a migraine because she may want to get him in to be seen. Patient states that he woke up this morning with a migraine and wanted to see what Dr. [name] would like to do.

## 2019-06-20 NOTE — Telephone Encounter (Signed)
I called the pt back. He does not have a headache but he does have lightheadedness, dizziness, nausea, typical episodic symptoms of his but not the worst one he has had. Pt can come at noon tomorrow. I placed him on the schedule. He understands if new symptoms occur or he has worsened persistant symptoms to go to the ED. He also understands if his episode completely resolves he can cancel the appt for tomorrow if needed. He verbalized appreciation for the call.

## 2019-06-20 NOTE — Telephone Encounter (Signed)
Andrew Hayes, can he come in tomorrow morning to see me at 10am or noon? If he still has the headache.Marland KitchenMarland Kitchen

## 2019-06-20 NOTE — Telephone Encounter (Signed)
I would like to see if a migraine cocktail will make the episode resolve. Can we ask if they have availability if he is still symptomatic? If he is not, he does not have to come to the office thanks

## 2019-06-21 ENCOUNTER — Other Ambulatory Visit: Payer: Self-pay

## 2019-06-21 ENCOUNTER — Encounter: Payer: Self-pay | Admitting: Neurology

## 2019-06-21 ENCOUNTER — Ambulatory Visit (INDEPENDENT_AMBULATORY_CARE_PROVIDER_SITE_OTHER): Payer: PPO | Admitting: Neurology

## 2019-06-21 VITALS — BP 136/78 | HR 68 | Temp 98.0°F | Ht 71.0 in | Wt 183.0 lb

## 2019-06-21 DIAGNOSIS — G43809 Other migraine, not intractable, without status migrainosus: Secondary | ICD-10-CM

## 2019-06-21 DIAGNOSIS — G43109 Migraine with aura, not intractable, without status migrainosus: Secondary | ICD-10-CM

## 2019-06-21 DIAGNOSIS — G43001 Migraine without aura, not intractable, with status migrainosus: Secondary | ICD-10-CM | POA: Diagnosis not present

## 2019-06-21 NOTE — Progress Notes (Signed)
Orders for Depacon 1 gram IV x 1 written and signed by Dr. [name]. Given to infusion RN Leanne.

## 2019-06-21 NOTE — Telephone Encounter (Addendum)
I spoke with the pt. He reports he still has some symptoms and is amenable to a migraine infusion if there is availability. He will be here at noon with a mask. He understands the check-in process. He verbalized appreciation for the call.   I spoke with Leanne RN in infusion. They do have availability during the 12:00 hour today. We will touch base when the pt arrives. Dr. [name] updated.

## 2019-06-25 DIAGNOSIS — G43809 Other migraine, not intractable, without status migrainosus: Secondary | ICD-10-CM | POA: Insufficient documentation

## 2019-06-25 NOTE — Progress Notes (Signed)
[facility]    Provider:  Dr [name] Requesting Provider: [name], MD Primary Care Provider:  [name], MD  CC:  Vestibular migraine  Interval history: Patient started experiencing vestibular symptoms several days ago. 2 days ago it put him in bed. Unclear if vestibular migraines or other etiology. Discussed we should take him to the IV infusion lab and treat him like a migraine and if symptoms resolve then this is a likely indication this is central in origin and not due to canaliths for example, I suspect vestibular migraines, discussed with patient and agreed on treatment. In the furute he will call then symptoms starts for 1g depacon, today his symptoms resolved afte rtreatment which included a fogginess, dizzines, not feeling right in the head, imbalance.  HPI:  Gwin Hetchler is a 78 y.o. male here as requested by [name], MD for dizziness. He was 78 years old with the first episode of vertigo, he has to lay down, a feeling of spinning and if he doesn't lay down perfectly flat keep his head on the bed he gets nauseated. Worst one was 4 years ago he was in bed for days and every time he got up he vomited. He has been evaluated by [facility] and multiple ENTs and diagnosed with vestibular migraines. He will episodes are a little dizziness, he takes rizatriptan and it does get very bad after that, but he has a "hangover" and may not feel good for a few days but he can avoid the severe vertigo and vomiting. He is getting these "little" episodes more frequently 2x a month that last for 4-5 days. No inciting events, he has evaluated all possible triggers and none found. He even tried a migraine diet for 3-4 months recommended by [facility] and didn;t help. He never gets headaches with the episodes. He has been to vestibular therapy. He drinks enough water, he has had brain MRIs, He is having more frequent episodes, he feels his balance is impaired and sometimes he does not  walk well, hearing changes. No Fhx of migraines.  Reviewed notes, labs and imaging from outside physicians, which showed:  IMPRESSION: MRI brain 2016 personally reviewed images 1. No imaging explanation for vestibular cochlear symptoms, as above. 2. Ventriculomegaly. Are there any symptoms of normal pressure hydrocephalus  Review of Systems: Patient complains of symptoms per HPI as well as the following symptoms: no falls. Pertinent negatives and positives per HPI. All others negative.   Social History   Socioeconomic History   Marital status: Married    Spouse name: Hassan Rowan   Number of children: 2   Years of education: 16   Highest education level: Not on file  Occupational History   Occupation: cpa    Employer: RETIRED    Comment: retired  Scientist, product/process development strain: Not on file   Food insecurity    Worry: Not on file    Inability: Not on Lexicographer needs    Medical: Not on file    Non-medical: Not on file  Tobacco Use   Smoking status: Former Smoker    Packs/day: 2.00    Years: 24.00    Pack years: 48.00    Types: Cigarettes    Quit date: 10/11/1984    Years since quitting: 34.7   Smokeless tobacco: Former Systems developer   Tobacco comment: briefly used smokeless tobacco  Substance and Sexual Activity   Alcohol use: Yes    Alcohol/week: 7.0 standard drinks  Types: 7 Standard drinks or equivalent per week    Comment: 7 beers weekly   Drug use: No   Sexual activity: Not on file  Lifestyle   Physical activity    Days per week: Not on file    Minutes per session: Not on file   Stress: Not on file  Relationships   Social connections    Talks on phone: Not on file    Gets together: Not on file    Attends religious service: Not on file    Active member of club or organization: Not on file    Attends meetings of clubs or organizations: Not on file    Relationship status: Not on file   Intimate partner violence    Fear of current  or ex partner: Not on file    Emotionally abused: Not on file    Physically abused: Not on file    Forced sexual activity: Not on file  Other Topics Concern   Not on file  Social History Narrative   Patient lives at home with family.   Patient consumes about 2 cups of caffeine daily,right handed.   2 children (1 deceased)    Family History  Problem Relation Age of Onset   Stroke Mother    Hypertension Mother    Parkinson's disease Mother    COPD Father    Dementia Brother    Hydrocephalus Brother    Parkinson's disease Brother    Parkinson's disease Sister    Colon cancer Neg Hx    Migraines Neg Hx        none to his knowledge    Past Medical History:  Diagnosis Date   Actinic keratoses    Diverticulosis    Mild   Elevated fasting glucose    Mildly   Herniated disc    Junctional nevus    Lipidemia    Mixed   Lyme disease 2020   pt placed on abx and stated he stopped taking vitamins so as not to interfere.   Mild sleep apnea    Organic impotence    Plantar fasciitis    Prostate cancer [facility])     Patient Active Problem List   Diagnosis Date Noted   Vestibular migraine 06/25/2019   Fever and chills 05/28/2019   Impingement syndrome of left shoulder 08/10/2017   Chronic left shoulder pain 07/21/2017   Dysphagia 06/09/2015   History of colonic polyps 06/09/2015   Dizzy spells 03/08/2012   Hyperlipidemia 03/08/2012    Past Surgical History:  Procedure Laterality Date   APPENDECTOMY     COLONOSCOPY     ELBOW SURGERY  2013   KNEE ARTHROSCOPY  2013   knee and elbow   PROSTATECTOMY  1986   TONSILLECTOMY      Current Outpatient Medications  Medication Sig Dispense Refill   atorvastatin (LIPITOR) 10 MG tablet Take 1 tablet by mouth daily.     cholecalciferol (VITAMIN D) 1000 units tablet Take 2,000 Units by mouth daily.     co-enzyme Q-10 30 MG capsule Take 30 mg by mouth 2 (two) times daily.      ibuprofen  (ADVIL,MOTRIN) 200 MG tablet Take 400 mg by mouth every 6 (six) hours as needed.      meclizine (ANTIVERT) 25 MG tablet Take 1 tablet (25 mg total) by mouth every 6 (six) hours as needed for dizziness or nausea. 30 tablet 6   Multiple Vitamin (MULTI-VITAMIN PO) Take by mouth daily.  Omega-3 Fatty Acids (FISH OIL PO) Take by mouth daily.     rizatriptan (MAXALT-MLT) 10 MG disintegrating tablet Take 1 tablet (10 mg total) by mouth as needed for migraine. May repeat in 2 hours if needed 10 tablet 6   No current facility-administered medications for this visit.     Allergies as of 06/21/2019 - Review Complete 06/21/2019  Allergen Reaction Noted   Percocet [oxycodone-acetaminophen] Other (See Comments) 05/23/2015    Vitals: BP 136/78 (BP Location: Right Arm, Patient Position: Sitting)    Pulse 68    Temp 98 F (36.7 C) Comment: taken by check-in staff   Ht 5\' 11"  (1.803 m)    Wt 183 lb (83 kg)    BMI 25.52 kg/m  Last Weight:  Wt Readings from Last 1 Encounters:  06/21/19 183 lb (83 kg)   Last Height:   Ht Readings from Last 1 Encounters:  06/21/19 5\' 11"  (1.803 m)     Physical exam: Exam: Gen: NAD, conversant, well nourised, obese, well groomed                     CV: RRR, no MRG. No Carotid Bruits. No peripheral edema, warm, nontender Eyes: Conjunctivae clear without exudates or hemorrhage  Neuro: Detailed Neurologic Exam  Speech:    Speech is normal; fluent and spontaneous with normal comprehension.  Cognition:    The patient is oriented to person, place, and time;     recent and remote memory intact;     language fluent;     normal attention, concentration,     fund of knowledge Cranial Nerves:    The pupils are equal, round, and reactive to light. The fundi are normal and spontaneous venous pulsations are present. Visual fields are full to finger confrontation. Extraocular movements are intact. Trigeminal sensation is intact and the muscles of mastication are  normal. The face is symmetric. The palate elevates in the midline. Hearing intact. Voice is normal. Shoulder shrug is normal. The tongue has normal motion without fasciculations.   Coordination:    Normal finger to nose and heel to shin. Normal rapid alternating movements.   Gait:    Heel-toe and tandem gait are normal.   Motor Observation:    No asymmetry, no atrophy, and no involuntary movements noted. Tone:    Normal muscle tone.    Posture:    Posture is normal. normal erect    Strength:    Strength is V/V in the upper and lower limbs.      Sensation: intact to LT     Reflex Exam:  DTR's:    Deep tendon reflexes in the upper and lower extremities are normal bilaterally.   Toes:    The toes are downgoing bilaterally.   Clonus:    Clonus is absent.    Assessment/Plan:  Patients with episodes of dizziness and vertigo. He has been extensively evaluated by ENT and possibly vestibula rmigraines however does not fit with this diagnosis (never had headache which is often present with at least some of the dizzy episodes). Also need to check brain for strokes, schwannomas, vascular etiologies, carotid stenosis.   - MRI of the brain with IAC protocol - [ending  - 1g Depacon when sympomatic improved symptoms, likely vestibular migraines - in the future call with episodes and we ill bring in for infusion  - He prefers not to start a daily preventative   Cc: Little, Lennette Bihari, MD  Sarina Ill, MD  [facility] [employer], [place] [address]  A total of 15 minutes was spent face-to-face with this patient. Over half this time was spent on counseling patient on the  1. Vestibular migraine    diagnosis and different diagnostic and therapeutic options, counseling and coordination of care, risks ans benefits of management, compliance, or risk factor reduction and education.     Phone [phone] Fax [phone]

## 2019-06-28 ENCOUNTER — Telehealth: Payer: Self-pay | Admitting: Neurology

## 2019-06-28 NOTE — Telephone Encounter (Signed)
I called the pt. His questions were answered. He wanted to know the name of his infusion med last week, Depacon. He also wanted to setup mychart via computer/email. I confirmed his email and sent him the information through [facility]. He verbalized appreciation for the call.

## 2019-06-28 NOTE — Telephone Encounter (Signed)
Pt is asking for a call from RN to discuss an infusion from last week

## 2019-07-07 ENCOUNTER — Ambulatory Visit
Admission: RE | Admit: 2019-07-07 | Discharge: 2019-07-07 | Disposition: A | Discharge status: Home / Self Care | Payer: PPO | Source: Ambulatory Visit | Attending: Neurology | Admitting: Neurology

## 2019-07-07 ENCOUNTER — Other Ambulatory Visit: Payer: Self-pay

## 2019-07-07 DIAGNOSIS — R2689 Other abnormalities of gait and mobility: Secondary | ICD-10-CM

## 2019-07-07 DIAGNOSIS — R42 Dizziness and giddiness: Secondary | ICD-10-CM | POA: Diagnosis not present

## 2019-07-07 DIAGNOSIS — G459 Transient cerebral ischemic attack, unspecified: Secondary | ICD-10-CM

## 2019-07-07 DIAGNOSIS — R270 Ataxia, unspecified: Secondary | ICD-10-CM

## 2019-07-07 DIAGNOSIS — H81399 Other peripheral vertigo, unspecified ear: Secondary | ICD-10-CM

## 2019-07-07 DIAGNOSIS — A881 Epidemic vertigo: Secondary | ICD-10-CM | POA: Diagnosis not present

## 2019-07-07 MED ORDER — GADOBENATE DIMEGLUMINE 529 MG/ML IV SOLN
17.0000 mL | Freq: Once | INTRAVENOUS | Status: AC | PRN
  Administered 2019-07-07: 17 mL via INTRAVENOUS

## 2019-07-13 ENCOUNTER — Other Ambulatory Visit: Payer: PPO

## 2019-07-23 ENCOUNTER — Telehealth: Payer: Self-pay | Admitting: Neurology

## 2019-07-23 DIAGNOSIS — G43001 Migraine without aura, not intractable, with status migrainosus: Secondary | ICD-10-CM | POA: Diagnosis not present

## 2019-07-23 NOTE — Telephone Encounter (Signed)
Pt called stating he is needing another infusion treatment. Please advise.

## 2019-07-23 NOTE — Telephone Encounter (Signed)
Ok for 1 gram depacon IV x 1 per Dr. [name]. Infusion can see pt at noon or 2:30 pm today. I called the pt back. Phone rang x 2 minutes, no answer or vm option. I will call back again this morning.

## 2019-07-23 NOTE — Telephone Encounter (Signed)
Note pt had spoken with Liane RN in infusion and was scheduled for noon today. I spoke with the pt. His typical episode of vestibular migraine started last Thursday. He confirmed no changes to allergies, only has a percocet intolerance. His questions were answered. He said he and his wife have analyzed his diet the last few episodes and he has had meats likely heavy in nitrates so he will try to avoid those for awhile in case they are related. He also said he reviewed the MRI results from Dr. [name] and wanted to make sure she knew his brother had hydrocephalus. Pt also notices the bottom of his (pt's) feet feel spongy, he does notice a problem with gait, and occasional memory problems. Pt's next f/u is scheduled for 08/15/2019.  Orders written for Depacon 1 gram IV X 1. Ready for MD signature.

## 2019-08-15 ENCOUNTER — Other Ambulatory Visit: Payer: Self-pay

## 2019-08-15 ENCOUNTER — Ambulatory Visit (INDEPENDENT_AMBULATORY_CARE_PROVIDER_SITE_OTHER): Payer: PPO | Admitting: Neurology

## 2019-08-15 ENCOUNTER — Encounter: Payer: Self-pay | Admitting: Neurology

## 2019-08-15 ENCOUNTER — Telehealth: Payer: Self-pay | Admitting: *Deleted

## 2019-08-15 VITALS — BP 145/78 | HR 61 | Temp 98.1°F | Ht 71.0 in | Wt 187.0 lb

## 2019-08-15 DIAGNOSIS — G912 (Idiopathic) normal pressure hydrocephalus: Secondary | ICD-10-CM

## 2019-08-15 DIAGNOSIS — R42 Dizziness and giddiness: Secondary | ICD-10-CM

## 2019-08-15 DIAGNOSIS — G43009 Migraine without aura, not intractable, without status migrainosus: Secondary | ICD-10-CM | POA: Diagnosis not present

## 2019-08-15 MED ORDER — RIZATRIPTAN BENZOATE 10 MG PO TBDP: 10.0000 mg | ORAL_TABLET | ORAL | 11 refills | Status: DC | PRN

## 2019-08-15 NOTE — Progress Notes (Signed)
[facility]    Provider:  Dr [name] Requesting Provider: [name], MD Primary Care Provider:  [name], MD  CC:  Vestibular migraine  Interval history 08/15/2019: Patient with episodes of dizziness unclear etiology. Possibly central associated with migraines. He has had 2 episodes and treated with migraine IV infusion of Depacon. We discussed migraine preventative, he says meats with nitrates cause a lot of problems and triggers episodes, He doesn't have a problem with bacon but feels lunch meat affects him particularly salami. He does not have these often. 3 years ago he was having them every few months, now he has had a few more. He declines preventative, he will try to take it sooner right at the onset he will take rizatriptan. We also discussed his MRI today, reviewed images, he has some memory problems, he feels his balance is poor. His brother has NPH. He feels his memory is impaired. Reviewed MRI images with him, showed him very large ventricles.  Interval history: Patient started experiencing vestibular symptoms several days ago. 2 days ago it put him in bed. Unclear if vestibular migraines or other etiology. Discussed we should take him to the IV infusion lab and treat him like a migraine and if symptoms resolve then this is a likely indication this is central in origin and not due to canaliths for example, I suspect vestibular migraines, discussed with patient and agreed on treatment. In the furute he will call then symptoms starts for 1g depacon, today his symptoms resolved afte rtreatment which included a fogginess, dizzines, not feeling right in the head, imbalance.  HPI:  Nicolis Heaster is a 78 y.o. male here as requested by [name], MD for dizziness. He was 78 years old with the first episode of vertigo, he has to lay down, a feeling of spinning and if he doesn't lay down perfectly flat keep his head on the bed he gets nauseated. Worst one was 4  years ago he was in bed for days and every time he got up he vomited. He has been evaluated by [facility] and multiple ENTs and diagnosed with vestibular migraines. He will episodes are a little dizziness, he takes rizatriptan and it does get very bad after that, but he has a "hangover" and may not feel good for a few days but he can avoid the severe vertigo and vomiting. He is getting these "little" episodes more frequently 2x a month that last for 4-5 days. No inciting events, he has evaluated all possible triggers and none found. He even tried a migraine diet for 3-4 months recommended by [facility] and didn;t help. He never gets headaches with the episodes. He has been to vestibular therapy. He drinks enough water, he has had brain MRIs, He is having more frequent episodes, he feels his balance is impaired and sometimes he does not walk well, hearing changes. No Fhx of migraines.  Reviewed notes, labs and imaging from outside physicians, which showed:  IMPRESSION: MRI brain 2016 personally reviewed images 1. No imaging explanation for vestibular cochlear symptoms, as above. 2. Ventriculomegaly. Are there any symptoms of normal pressure hydrocephalus  Review of Systems: Patient complains of symptoms per HPI as well as the following symptoms: no falls. Pertinent negatives and positives per HPI. All others negative.   Social History   Socioeconomic History   Marital status: Married    Spouse name: Hassan Rowan   Number of children: 2   Years of education: 16   Highest education level: Not  on file  Occupational History   Occupation: cpa    Employer: RETIRED    Comment: retired  Scientist, product/process development strain: Not on file   Food insecurity    Worry: Not on file    Inability: Not on Lexicographer needs    Medical: Not on file    Non-medical: Not on file  Tobacco Use   Smoking status: Former Smoker    Packs/day: 2.00    Years: 24.00    Pack years: 48.00    Types: Cigarettes     Quit date: 10/11/1984    Years since quitting: 34.8   Smokeless tobacco: Former Systems developer   Tobacco comment: briefly used smokeless tobacco  Substance and Sexual Activity   Alcohol use: Yes    Alcohol/week: 7.0 standard drinks    Types: 7 Standard drinks or equivalent per week    Comment: 7 beers weekly   Drug use: No   Sexual activity: Not on file  Lifestyle   Physical activity    Days per week: Not on file    Minutes per session: Not on file   Stress: Not on file  Relationships   Social connections    Talks on phone: Not on file    Gets together: Not on file    Attends religious service: Not on file    Active member of club or organization: Not on file    Attends meetings of clubs or organizations: Not on file    Relationship status: Not on file   Intimate partner violence    Fear of current or ex partner: Not on file    Emotionally abused: Not on file    Physically abused: Not on file    Forced sexual activity: Not on file  Other Topics Concern   Not on file  Social History Narrative   Patient lives at home with family.   Patient consumes about 2 cups of caffeine daily,   right handed.   2 children (1 deceased)    Family History  Problem Relation Age of Onset   Stroke Mother    Hypertension Mother    Parkinson's disease Mother    COPD Father    Dementia Brother    Hydrocephalus Brother    Parkinson's disease Brother    Parkinson's disease Sister    Colon cancer Neg Hx    Migraines Neg Hx        none to his knowledge    Past Medical History:  Diagnosis Date   Actinic keratoses    Diverticulosis    Mild   Elevated fasting glucose    Mildly   Herniated disc    Junctional nevus    Lipidemia    Mixed   Lyme disease 2020   pt placed on abx and stated he stopped taking vitamins so as not to interfere.   Mild sleep apnea    Organic impotence    Plantar fasciitis    Prostate cancer [facility])     Patient Active Problem List    Diagnosis Date Noted   Vestibular migraine 06/25/2019   Fever and chills 05/28/2019   Impingement syndrome of left shoulder 08/10/2017   Chronic left shoulder pain 07/21/2017   Dysphagia 06/09/2015   History of colonic polyps 06/09/2015   Dizzy spells 03/08/2012   Hyperlipidemia 03/08/2012    Past Surgical History:  Procedure Laterality Date   APPENDECTOMY     COLONOSCOPY     ELBOW SURGERY  2013   KNEE ARTHROSCOPY  2013   knee and elbow   PROSTATECTOMY  1986   TONSILLECTOMY      Current Outpatient Medications  Medication Sig Dispense Refill   atorvastatin (LIPITOR) 10 MG tablet Take 1 tablet by mouth daily.     cholecalciferol (VITAMIN D) 1000 units tablet Take 4,000 Units by mouth daily.      co-enzyme Q-10 30 MG capsule Take 30 mg by mouth 2 (two) times daily.      ibuprofen (ADVIL,MOTRIN) 200 MG tablet Take 400 mg by mouth every 6 (six) hours as needed.      meclizine (ANTIVERT) 25 MG tablet Take 1 tablet (25 mg total) by mouth every 6 (six) hours as needed for dizziness or nausea. 30 tablet 6   Multiple Vitamin (MULTI-VITAMIN PO) Take by mouth daily.     Omega-3 Fatty Acids (FISH OIL PO) Take by mouth daily.     rizatriptan (MAXALT-MLT) 10 MG disintegrating tablet Take 1 tablet (10 mg total) by mouth as needed for migraine. May repeat in 2 hours if needed 10 tablet 6   No current facility-administered medications for this visit.     Allergies as of 08/15/2019 - Review Complete 08/15/2019  Allergen Reaction Noted   Percocet [oxycodone-acetaminophen] Other (See Comments) 05/23/2015    Vitals: There were no vitals taken for this visit. Last Weight:  Wt Readings from Last 1 Encounters:  06/21/19 183 lb (83 kg)   Last Height:   Ht Readings from Last 1 Encounters:  06/21/19 5\' 11"  (1.803 m)     Physical exam: Exam: Gen: NAD, conversant, well nourised, obese, well groomed                     CV: RRR, no MRG. No Carotid Bruits. No peripheral  edema, warm, nontender Eyes: Conjunctivae clear without exudates or hemorrhage  Neuro: Detailed Neurologic Exam  Speech:    Speech is normal; fluent and spontaneous with normal comprehension.  Cognition:    The patient is oriented to person, place, and time;     recent and remote memory intact;     language fluent;     normal attention, concentration,     fund of knowledge Cranial Nerves:    The pupils are equal, round, and reactive to light. The fundi are normal and spontaneous venous pulsations are present. Visual fields are full to finger confrontation. Extraocular movements are intact. Trigeminal sensation is intact and the muscles of mastication are normal. The face is symmetric. The palate elevates in the midline. Hearing intact. Voice is normal. Shoulder shrug is normal. The tongue has normal motion without fasciculations.   Coordination:    Normal finger to nose and heel to shin. Normal rapid alternating movements.   Gait:    Heel-toe and tandem gait are normal.   Motor Observation:    No asymmetry, no atrophy, and no involuntary movements noted. Tone:    Normal muscle tone.    Posture:    Posture is normal. normal erect    Strength:    Strength is V/V in the upper and lower limbs.      Sensation: intact to LT     Reflex Exam:  DTR's:    Deep tendon reflexes in the upper and lower extremities are normal bilaterally.   Toes:    The toes are downgoing bilaterally.   Clonus:    Clonus is absent.    Assessment/Plan:  Patients with episodes of dizziness and  vertigo. He has been extensively evaluated by ENT and possibly vestibular migraines however does not fit completely with this diagnosis (never had headache with the dizziness which is often present with at least some of the dizzy episodes). Imaging ruled out strokes, schwannomas, vascular etiologies, carotid stenosis. May be vestibular migraines.   - MRI shows large ventricles, he does report memory problems,  imbalance, issues with gait however he does not seem that impaired but his brother had NPH s/p shunt. I recommend he see Dr. [name], he can be seen Friday so I will let Dr. [name] decide whether LP and/or shunting is an option. He doesn't seem too impaired but we often catch this condition too late when dementia already apparent and the damage is done, I would suggest an aggressive approach as ventricles are very large, as clinically warranted by Dr. [name]. He feels strange with gait, he had a thorough workup for neuropathy which was negative including emg/ncs.   - rizatriptan acutely   Cc: [name], MD  Sarina Ill, MD  [facility] [address] [place], [place] [address]  A total of 40 minutes was spent face-to-face with this patient. Over half this time was spent on counseling patient on the  1. NPH (normal pressure hydrocephalus) (HCC)    diagnosis and different diagnostic and therapeutic options, counseling and coordination of care, risks ans benefits of management, compliance, or risk factor reduction and education.    A total of 15 minutes was spent face-to-face with this patient. Over half this time was spent on counseling patient on the  No diagnosis found. diagnosis and different diagnostic and therapeutic options, counseling and coordination of care, risks ans benefits of management, compliance, or risk factor reduction and education.     Phone [phone] Fax [phone]

## 2019-08-15 NOTE — Progress Notes (Signed)
Received fax from [facility]. Pt has an appt on 08/17/2019 @ 10:45 AM with Dr. Judith Part. They request a copy of Dr. Cathren Laine office note when available.

## 2019-08-15 NOTE — Patient Instructions (Signed)
Normal-Pressure Hydrocephalus Normal pressure hydrocephalus (NPH) is a buildup of fluid (cerebrospinal fluid, CSF) inside the brain. This may or may not increase the pressure inside the brain (intracranial pressure). CSF is normally present in the brain, but when too much CSF increases pressure on the brain, it can affect brain function. NPH usually occurs in people who are older than age 76. Many symptoms of NPH are also associated with aging or certain medical conditions. It is important to pay attention to changes in behavior and mental function. What are the causes? This condition may be caused by anything that blocks the flow of CSF, such as:  Head injury.  Infections.  Brain surgery.  Bleeding from a blood vessel in the brain.  Tumors or cancer. In many cases, the cause of this condition is not known. What are the signs or symptoms? Symptoms of this condition may include:  Difficulty walking, such as: ? Shuffling feet when walking. ? Unsteadiness. ? Problems when beginning to walk. ? Feet feeling as though they are stuck or "frozen" to the floor.  Problems with bowel and bladder control.  Memory problems, such as: ? Forgetfulness. ? Lack of concentration. ? Mood problems. ? Confusion. How is this diagnosed? This condition is diagnosed based on:  Your medical history.  A physical exam. This can reveal walking (gait) changes or twitching or sudden muscle tightening (spasms) in the legs.  Other tests to confirm the diagnosis and identify the best options for treatment. These tests include: ? Removal and examination of a small amount of CSF (lumbar puncture). ? CT scan of your head. ? MRI scan of your head. How is this treated?  This condition may be treated with surgery in which a narrow tube (ventriculoperitoneal shunt, or VP shunt) is placed in the brain to drain excess CSF. After a shunt is placed, you will be monitored to make sure CSF is draining properly.  Depending on your age and overall health condition, you may not be a candidate for surgery. If your symptoms are mild, your condition may be monitored on a regular basis before a decision is made about whether a shunt is needed. Follow these instructions at home: Medicines  Take over-the-counter and prescription medicines only as told by your health care provider.  Avoid taking medicines that can affect thinking, such as pain or sleeping medicines. Lifestyle  Do not use any products that contain nicotine or tobacco, such as cigarettes and e-cigarettes. If you need help quitting, ask your health care provider.  Drink enough fluid to keep your urine clear or pale yellow. If you have a shunt:  Do not wear tight-fitting hats or headgear.  Before having an MRI or abdominal surgery, tell your health care provider that you have a shunt.  Watch for signs of infection around the shunt, such as: ? Fever. ? Redness, pain, or swelling of the skin along the shunt path. ? Headache or stiff neck. ? Nausea or vomiting. General instructions  Work with your health care provider to determine what you need help with and what your safety needs are.  Ask your health care provider when you can resume your normal activities.  Keep all follow-up visits as told by your health care provider. This is important. Contact a health care provider if:  You have any new symptoms.  Your symptoms get worse.  Your symptoms do not improve after surgery. Get help right away if:  You have: ? A fever or other signs of infection. ?   New or worsening confusion. ? New or worsening sleepiness. ? A hard time staying awake. ? A headache that is getting worse.  You start to twitch or shake (seizure).  You lose coordination or balance.  Your or your family members become concerned for your safety. Summary  Normal pressure hydrocephalus (NPH) is a buildup of fluid (cerebrospinal fluid, CSF) inside the brain. Too  much CSF can put pressure on the brain and affect its function.  Anything that blocks the flow of CSF can cause this condition. In some cases, the cause of this condition is not known.  Depending on your age, overall health condition, and diagnostic tests, you may be a good candidate for a shunt to drain excess CSF. This information is not intended to replace advice given to you by your health care provider. Make sure you discuss any questions you have with your health care provider. Document Released: 02/11/2014 Document Revised: 02/14/2018 Document Reviewed: 10/28/2016 Elsevier Patient Education  2020 [employer].

## 2019-08-15 NOTE — Telephone Encounter (Signed)
Faxed office note from 08/15/2019 to Dr. [name] at [facility] for pt's 08/17/2019 appt. Received a receipt of confirmation.

## 2019-08-17 ENCOUNTER — Other Ambulatory Visit: Payer: Self-pay | Admitting: Neurological Surgery

## 2019-08-17 DIAGNOSIS — G912 (Idiopathic) normal pressure hydrocephalus: Secondary | ICD-10-CM | POA: Diagnosis not present

## 2019-08-17 DIAGNOSIS — R030 Elevated blood-pressure reading, without diagnosis of hypertension: Secondary | ICD-10-CM | POA: Diagnosis not present

## 2019-08-19 LAB — RPR: RPR Ser Ql: NONREACTIVE

## 2019-08-19 LAB — B12 AND FOLATE PANEL
Folate: >20 ng/mL (ref >3.0)
Vitamin B-12: 1197 pg/mL (ref 232–1245)

## 2019-08-19 LAB — TSH: TSH: 2.01 u[IU]/mL (ref 0.450–4.500)

## 2019-08-19 LAB — METHYLMALONIC ACID, SERUM: Methylmalonic Acid: 174 nmol/L (ref 0–378)

## 2019-08-19 LAB — VITAMIN B1: Thiamine: 189.3 nmol/L (ref 66.5–200.0)

## 2019-08-19 LAB — HOMOCYSTEINE: Homocysteine: 11.1 umol/L (ref 0.0–19.2)

## 2019-09-11 ENCOUNTER — Telehealth: Payer: Self-pay | Admitting: Neurology

## 2019-09-11 DIAGNOSIS — G4733 Obstructive sleep apnea (adult) (pediatric): Secondary | ICD-10-CM

## 2019-09-11 NOTE — Telephone Encounter (Signed)
I placed an order for a new CPAP machine.  Please send to his DME company.  Please also make sure he has a follow-up appointment in 3 months to see the nurse practitioner for compliance.  He has to be seen within 90 days for insurance reasons, after starting  new equipment.

## 2019-09-11 NOTE — Telephone Encounter (Signed)
I sent a message to pt's DME company advising of the new order that was placed.

## 2019-09-11 NOTE — Telephone Encounter (Signed)
Patient called stating he would like to re order his cpap machine. States it is time for a new one. Please follow up.

## 2019-09-11 NOTE — Telephone Encounter (Signed)
I spoke with the patient. He stated he spoke with [employer] today about the machine and now he doesn't think it needs to be replaced yet. He was told by them that there would be an error message or something to let him know if it wasn't working correctly. The patient was thinking it might just be time to replace after so much time had passed. He will call if he changes his mind or feels the machine is having issues. Will keep next appt as scheduled for 01/09/20 @ 3 pm. He was very appreciative for the call.

## 2019-09-12 NOTE — Progress Notes (Signed)
[facility] [address], [place] [address] Phone: [phone] Fax: [phone]      Your procedure is scheduled on 09-17-19  Report to [facility] Main Entrance "A" at 10 A.M., and check in at the Admitting office.  Call this number if you have problems the morning of surgery:  [phone]  Call [phone] if you have any questions prior to your surgery date Monday-Friday 8am-4pm    Remember:  Do not eat or drink after midnight the night before your surgery   Take these medicines the morning of surgery with A SIP OF WATER : atorvastatin (LIPITOR meclizine (ANTIVERT) as needed rizatriptan (MAXALT-MLT) as needed  Follow your surgeon's instructions on when to stop Aspirin.  If no instructions were given by your surgeon then you will need to call the office to get those instructions.    7 days prior to surgery STOP taking any Aspirin (unless otherwise instructed by your surgeon), Aleve, Naproxen, Ibuprofen, Motrin, Advil, Goody's, BC's, all herbal medications, fish oil, and all vitamins.    The Morning of Surgery  Do not wear jewelry, make-up or nail polish.  Do not wear lotions, powders, or perfumes/colognes, or deodorant  Do not shave 48 hours prior to surgery.    Do not bring valuables to the hospital.  [facility] is not responsible for any belongings or valuables.  If you are a smoker, DO NOT Smoke 24 hours prior to surgery  If you wear a CPAP at night please bring your mask, tubing, and machine the morning of surgery   Remember that you must have someone to transport you home after your surgery, and remain with you for 24 hours if you are discharged the same day.   Please bring cases for contacts, glasses, hearing aids, dentures or bridgework because it cannot be worn into surgery.    Leave your suitcase in the car.  After surgery it may be brought to your room.  For patients admitted to  the hospital, discharge time will be determined by your treatment team.  Patients discharged the day of surgery will not be allowed to drive home.    Special instructions:   [facility]- Preparing For Surgery  Before surgery, you can play an important role. Because skin is not sterile, your skin needs to be as free of germs as possible. You can reduce the number of germs on your skin by washing with CHG (chlorahexidine gluconate) Soap before surgery.  CHG is an antiseptic cleaner which kills germs and bonds with the skin to continue killing germs even after washing.    Oral Hygiene is also important to reduce your risk of infection.  Remember - BRUSH YOUR TEETH THE MORNING OF SURGERY WITH YOUR REGULAR TOOTHPASTE  Please do not use if you have an allergy to CHG or antibacterial soaps. If your skin becomes reddened/irritated stop using the CHG.  Do not shave (including legs and underarms) for at least 48 hours prior to first CHG shower. It is OK to shave your face.  Please follow these instructions carefully.   1. Shower the NIGHT BEFORE SURGERY and the MORNING OF SURGERY with CHG Soap.   2. If you chose to wash your hair, wash your hair first as usual with your normal shampoo.  3. After you shampoo, rinse your hair and body thoroughly to remove the shampoo.  4. Use CHG as you would any other liquid soap. You can apply  CHG directly to the skin and wash gently with a scrungie or a clean washcloth.   5. Apply the CHG Soap to your body ONLY FROM THE NECK DOWN.  Do not use on open wounds or open sores. Avoid contact with your eyes, ears, mouth and genitals (private parts). Wash Face and genitals (private parts)  with your normal soap.   6. Wash thoroughly, paying special attention to the area where your surgery will be performed.  7. Thoroughly rinse your body with warm water from the neck down.  8. DO NOT shower/wash with your normal soap after using and rinsing off the CHG Soap.  9. Pat  yourself dry with a CLEAN TOWEL.  10. Wear CLEAN PAJAMAS to bed the night before surgery, wear comfortable clothes the morning of surgery  11. Place CLEAN SHEETS on your bed the night of your first shower and DO NOT SLEEP WITH PETS.    Day of Surgery:  Please shower the morning of surgery with the CHG soap Do not apply any deodorants/lotions. Please wear clean clothes to the hospital/surgery center.   Remember to brush your teeth WITH YOUR REGULAR TOOTHPASTE.   Please read over the  fact sheets that you were given.

## 2019-09-13 ENCOUNTER — Encounter (HOSPITAL_COMMUNITY)
Admission: RE | Admit: 2019-09-13 | Discharge: 2019-09-13 | Disposition: A | Discharge status: Home / Self Care | Payer: PPO | Source: Ambulatory Visit | Attending: Neurological Surgery | Admitting: Neurological Surgery

## 2019-09-13 ENCOUNTER — Encounter (HOSPITAL_COMMUNITY): Payer: Self-pay

## 2019-09-13 ENCOUNTER — Other Ambulatory Visit (HOSPITAL_COMMUNITY)
Admission: RE | Admit: 2019-09-13 | Discharge: 2019-09-13 | Disposition: A | Discharge status: Home / Self Care | Payer: PPO | Source: Ambulatory Visit | Attending: Neurological Surgery | Admitting: Neurological Surgery

## 2019-09-13 ENCOUNTER — Other Ambulatory Visit: Payer: Self-pay

## 2019-09-13 DIAGNOSIS — Z01812 Encounter for preprocedural laboratory examination: Secondary | ICD-10-CM | POA: Diagnosis not present

## 2019-09-13 DIAGNOSIS — Z20828 Contact with and (suspected) exposure to other viral communicable diseases: Secondary | ICD-10-CM | POA: Diagnosis not present

## 2019-09-13 LAB — BASIC METABOLIC PANEL
Anion gap: 8 (ref 5–15)
BUN: 18 mg/dL (ref 8–23)
CO2: 25 mmol/L (ref 22–32)
Calcium: 9.1 mg/dL (ref 8.9–10.3)
Chloride: 107 mmol/L (ref 98–111)
Creatinine, Ser: 0.86 mg/dL (ref 0.61–1.24)
GFR calc Af Amer: >60 mL/min (ref >60)
GFR calc non Af Amer: >60 mL/min (ref >60)
Glucose, Bld: 88 mg/dL (ref 70–99)
Potassium: 3.8 mmol/L (ref 3.5–5.1)
Sodium: 140 mmol/L (ref 135–145)

## 2019-09-13 LAB — CBC
HCT: 43.2 % (ref 39.0–52.0)
Hemoglobin: 14.3 g/dL (ref 13.0–17.0)
MCH: 32.4 pg (ref 26.0–34.0)
MCHC: 33.1 g/dL (ref 30.0–36.0)
MCV: 97.7 fL (ref 80.0–100.0)
Platelets: 211 10*3/uL (ref 150–400)
RBC: 4.42 MIL/uL (ref 4.22–5.81)
RDW: 12.7 % (ref 11.5–15.5)
WBC: 6.8 10*3/uL (ref 4.0–10.5)
nRBC: 0 % (ref 0.0–0.2)

## 2019-09-13 LAB — SURGICAL PCR SCREEN
MRSA, PCR: NEGATIVE
Staphylococcus aureus: NEGATIVE

## 2019-09-13 MED ORDER — CHLORHEXIDINE GLUCONATE CLOTH 2 % EX PADS: 6.0000 | MEDICATED_PAD | Freq: Once | CUTANEOUS | Status: DC

## 2019-09-13 NOTE — Progress Notes (Signed)
PCP - K LITTLE Cardiologist - NA    R  Chest x-ray - NA EKG - 9/14 Stress Test -  ECHO - 2/13 Cardiac Cath - NA  Sleep Study -YES  CPAP - YES  Fasting Blood Sugar - NA Checks Blood Sugar _____ times a da : Aspirin Instructions:STOP     COVID TEST-  TODAY  Anesthesia review: NA  Patient denies shortness of breath, fever, cough and chest pain at PAT appointment   All instructions explained to the patient, with a verbal understanding of the material. Patient agrees to go over the instructions while at home for a better understanding. Patient also instructed to self quarantine after being tested for COVID-19. The opportunity to ask questions was provided.

## 2019-09-16 LAB — NOVEL CORONAVIRUS, NAA (HOSP ORDER, SEND-OUT TO REF LAB; TAT 18-24 HRS): SARS-CoV-2, NAA: NOT DETECTED

## 2019-09-17 ENCOUNTER — Inpatient Hospital Stay (HOSPITAL_COMMUNITY)
Admission: RE | Admit: 2019-09-17 | Discharge: 2019-09-19 | DRG: 057 | Disposition: A | Discharge status: Home / Self Care | Payer: PPO | Attending: Neurological Surgery | Admitting: Neurological Surgery

## 2019-09-17 ENCOUNTER — Inpatient Hospital Stay (HOSPITAL_COMMUNITY): Payer: PPO | Admitting: Anesthesiology

## 2019-09-17 ENCOUNTER — Other Ambulatory Visit: Payer: Self-pay

## 2019-09-17 ENCOUNTER — Inpatient Hospital Stay (HOSPITAL_COMMUNITY): Payer: PPO

## 2019-09-17 ENCOUNTER — Encounter (HOSPITAL_COMMUNITY): Payer: Self-pay

## 2019-09-17 ENCOUNTER — Encounter (HOSPITAL_COMMUNITY)
Admission: RE | Disposition: A | Discharge status: Home / Self Care | Payer: Self-pay | Source: Home / Self Care | Attending: Neurological Surgery

## 2019-09-17 DIAGNOSIS — Z825 Family history of asthma and other chronic lower respiratory diseases: Secondary | ICD-10-CM

## 2019-09-17 DIAGNOSIS — G4730 Sleep apnea, unspecified: Secondary | ICD-10-CM | POA: Diagnosis present

## 2019-09-17 DIAGNOSIS — Z823 Family history of stroke: Secondary | ICD-10-CM | POA: Diagnosis not present

## 2019-09-17 DIAGNOSIS — Z820 Family history of epilepsy and other diseases of the nervous system: Secondary | ICD-10-CM

## 2019-09-17 DIAGNOSIS — Z8249 Family history of ischemic heart disease and other diseases of the circulatory system: Secondary | ICD-10-CM

## 2019-09-17 DIAGNOSIS — G912 (Idiopathic) normal pressure hydrocephalus: Principal | ICD-10-CM | POA: Diagnosis present

## 2019-09-17 DIAGNOSIS — R270 Ataxia, unspecified: Secondary | ICD-10-CM | POA: Diagnosis present

## 2019-09-17 DIAGNOSIS — Z8546 Personal history of malignant neoplasm of prostate: Secondary | ICD-10-CM

## 2019-09-17 DIAGNOSIS — Z419 Encounter for procedure for purposes other than remedying health state, unspecified: Secondary | ICD-10-CM

## 2019-09-17 DIAGNOSIS — Z9989 Dependence on other enabling machines and devices: Secondary | ICD-10-CM | POA: Diagnosis not present

## 2019-09-17 DIAGNOSIS — G4733 Obstructive sleep apnea (adult) (pediatric): Secondary | ICD-10-CM | POA: Diagnosis not present

## 2019-09-17 DIAGNOSIS — Z87891 Personal history of nicotine dependence: Secondary | ICD-10-CM

## 2019-09-17 DIAGNOSIS — E782 Mixed hyperlipidemia: Secondary | ICD-10-CM | POA: Diagnosis not present

## 2019-09-17 DIAGNOSIS — Z981 Arthrodesis status: Secondary | ICD-10-CM | POA: Diagnosis not present

## 2019-09-17 DIAGNOSIS — N529 Male erectile dysfunction, unspecified: Secondary | ICD-10-CM | POA: Diagnosis not present

## 2019-09-17 DIAGNOSIS — Z20828 Contact with and (suspected) exposure to other viral communicable diseases: Secondary | ICD-10-CM | POA: Diagnosis present

## 2019-09-17 HISTORY — PX: PLACEMENT OF LUMBAR DRAIN: SHX6028

## 2019-09-17 LAB — CBC
HCT: 44 % (ref 39.0–52.0)
Hemoglobin: 14.7 g/dL (ref 13.0–17.0)
MCH: 32.8 pg (ref 26.0–34.0)
MCHC: 33.4 g/dL (ref 30.0–36.0)
MCV: 98.2 fL (ref 80.0–100.0)
Platelets: 212 10*3/uL (ref 150–400)
RBC: 4.48 MIL/uL (ref 4.22–5.81)
RDW: 12.8 % (ref 11.5–15.5)
WBC: 6.5 10*3/uL (ref 4.0–10.5)
nRBC: 0 % (ref 0.0–0.2)

## 2019-09-17 LAB — CREATININE, SERUM
Creatinine, Ser: 1.1 mg/dL (ref 0.61–1.24)
GFR calc Af Amer: >60 mL/min (ref >60)
GFR calc non Af Amer: >60 mL/min (ref >60)

## 2019-09-17 SURGERY — PLACEMENT OF LUMBAR DRAIN
Anesthesia: General | Site: Spine Lumbar

## 2019-09-17 MED ORDER — ACETAMINOPHEN 325 MG PO TABS: 650.0000 mg | ORAL_TABLET | ORAL | Status: DC | PRN

## 2019-09-17 MED ORDER — SODIUM CHLORIDE 0.9% FLUSH: 3.0000 mL | INTRAVENOUS | Status: DC | PRN

## 2019-09-17 MED ORDER — LIDOCAINE 2% (20 MG/ML) 5 ML SYRINGE
INTRAMUSCULAR | Status: AC
  Filled 2019-09-17: qty 5

## 2019-09-17 MED ORDER — SODIUM CHLORIDE 0.9 % IV SOLN
250.0000 mL | INTRAVENOUS | Status: DC
  Administered 2019-09-17: 21:00:00 250 mL via INTRAVENOUS

## 2019-09-17 MED ORDER — CEFAZOLIN SODIUM-DEXTROSE 2-4 GM/100ML-% IV SOLN
2.0000 g | Freq: Three times a day (TID) | INTRAVENOUS | Status: AC
  Administered 2019-09-17 – 2019-09-18 (×2): 2 g via INTRAVENOUS
  Filled 2019-09-17 (×3): qty 100

## 2019-09-17 MED ORDER — HYDROMORPHONE HCL 1 MG/ML IJ SOLN: 0.5000 mg | INTRAMUSCULAR | Status: DC | PRN

## 2019-09-17 MED ORDER — LIDOCAINE-EPINEPHRINE 1 %-1:100000 IJ SOLN
INTRAMUSCULAR | Status: AC
  Filled 2019-09-17: qty 1

## 2019-09-17 MED ORDER — FENTANYL CITRATE (PF) 100 MCG/2ML IJ SOLN
INTRAMUSCULAR | Status: DC | PRN
  Administered 2019-09-17: 100 ug via INTRAVENOUS

## 2019-09-17 MED ORDER — ROCURONIUM BROMIDE 10 MG/ML (PF) SYRINGE
PREFILLED_SYRINGE | INTRAVENOUS | Status: AC
  Filled 2019-09-17: qty 10

## 2019-09-17 MED ORDER — SUGAMMADEX SODIUM 500 MG/5ML IV SOLN
INTRAVENOUS | Status: DC | PRN
  Administered 2019-09-17: 335.6 mg via INTRAVENOUS

## 2019-09-17 MED ORDER — 0.9 % SODIUM CHLORIDE (POUR BTL) OPTIME
TOPICAL | Status: DC | PRN
  Administered 2019-09-17: 1000 mL

## 2019-09-17 MED ORDER — ACETAMINOPHEN 650 MG RE SUPP: 650.0000 mg | RECTAL | Status: DC | PRN

## 2019-09-17 MED ORDER — ONDANSETRON HCL 4 MG/2ML IJ SOLN: 4.0000 mg | Freq: Once | INTRAMUSCULAR | Status: DC | PRN

## 2019-09-17 MED ORDER — MENTHOL 3 MG MT LOZG: 1.0000 | LOZENGE | OROMUCOSAL | Status: DC | PRN

## 2019-09-17 MED ORDER — LACTATED RINGERS IV SOLN
INTRAVENOUS | Status: DC
  Administered 2019-09-17: 11:00:00 via INTRAVENOUS

## 2019-09-17 MED ORDER — FENTANYL CITRATE (PF) 250 MCG/5ML IJ SOLN
INTRAMUSCULAR | Status: AC
  Filled 2019-09-17: qty 5

## 2019-09-17 MED ORDER — SUMATRIPTAN SUCCINATE 100 MG PO TABS
100.0000 mg | ORAL_TABLET | ORAL | Status: DC | PRN
  Filled 2019-09-17: qty 1

## 2019-09-17 MED ORDER — MECLIZINE HCL 25 MG PO TABS
25.0000 mg | ORAL_TABLET | Freq: Four times a day (QID) | ORAL | Status: DC | PRN
  Filled 2019-09-17: qty 1

## 2019-09-17 MED ORDER — ATORVASTATIN CALCIUM 10 MG PO TABS
10.0000 mg | ORAL_TABLET | Freq: Every day | ORAL | Status: DC
  Administered 2019-09-18 – 2019-09-19 (×2): 10 mg via ORAL
  Filled 2019-09-17 (×2): qty 1

## 2019-09-17 MED ORDER — PHENYLEPHRINE 40 MCG/ML (10ML) SYRINGE FOR IV PUSH (FOR BLOOD PRESSURE SUPPORT)
PREFILLED_SYRINGE | INTRAVENOUS | Status: DC | PRN
  Administered 2019-09-17: 80 ug via INTRAVENOUS

## 2019-09-17 MED ORDER — FENTANYL CITRATE (PF) 100 MCG/2ML IJ SOLN: 25.0000 ug | INTRAMUSCULAR | Status: DC | PRN

## 2019-09-17 MED ORDER — EPHEDRINE SULFATE-NACL 50-0.9 MG/10ML-% IV SOSY
PREFILLED_SYRINGE | INTRAVENOUS | Status: DC | PRN
  Administered 2019-09-17: 10 mg via INTRAVENOUS

## 2019-09-17 MED ORDER — HEPARIN SODIUM (PORCINE) 5000 UNIT/ML IJ SOLN
5000.0000 [IU] | Freq: Three times a day (TID) | INTRAMUSCULAR | Status: DC
  Administered 2019-09-18 (×2): 5000 [IU] via SUBCUTANEOUS
  Filled 2019-09-17 (×2): qty 1

## 2019-09-17 MED ORDER — PROPOFOL 10 MG/ML IV BOLUS: INTRAVENOUS | Status: DC | PRN

## 2019-09-17 MED ORDER — SUCCINYLCHOLINE CHLORIDE 200 MG/10ML IV SOSY
PREFILLED_SYRINGE | INTRAVENOUS | Status: AC
  Filled 2019-09-17: qty 10

## 2019-09-17 MED ORDER — LIDOCAINE HCL (CARDIAC) PF 100 MG/5ML IV SOSY
PREFILLED_SYRINGE | INTRAVENOUS | Status: DC | PRN
  Administered 2019-09-17: 60 mg via INTRAVENOUS

## 2019-09-17 MED ORDER — OXYCODONE HCL 5 MG PO TABS: 5.0000 mg | ORAL_TABLET | ORAL | Status: DC | PRN

## 2019-09-17 MED ORDER — SUGAMMADEX SODIUM 500 MG/5ML IV SOLN
INTRAVENOUS | Status: AC
  Filled 2019-09-17: qty 5

## 2019-09-17 MED ORDER — DEXAMETHASONE SODIUM PHOSPHATE 10 MG/ML IJ SOLN
INTRAMUSCULAR | Status: DC | PRN
  Administered 2019-09-17: 10 mg via INTRAVENOUS

## 2019-09-17 MED ORDER — CHLORHEXIDINE GLUCONATE CLOTH 2 % EX PADS: 6.0000 | MEDICATED_PAD | Freq: Every day | CUTANEOUS | Status: DC

## 2019-09-17 MED ORDER — SODIUM CHLORIDE 0.9 % IV SOLN
INTRAVENOUS | Status: DC | PRN
  Administered 2019-09-17: 12:00:00

## 2019-09-17 MED ORDER — DEXAMETHASONE SODIUM PHOSPHATE 10 MG/ML IJ SOLN
INTRAMUSCULAR | Status: AC
  Filled 2019-09-17: qty 1

## 2019-09-17 MED ORDER — PHENOL 1.4 % MT LIQD: 1.0000 | OROMUCOSAL | Status: DC | PRN

## 2019-09-17 MED ORDER — EPHEDRINE 5 MG/ML INJ
INTRAVENOUS | Status: AC
  Filled 2019-09-17: qty 10

## 2019-09-17 MED ORDER — DIPHENHYDRAMINE HCL 25 MG PO CAPS
25.0000 mg | ORAL_CAPSULE | Freq: Every evening | ORAL | Status: DC | PRN
  Administered 2019-09-17 – 2019-09-18 (×2): 50 mg via ORAL
  Filled 2019-09-17 (×2): qty 2

## 2019-09-17 MED ORDER — SODIUM CHLORIDE 0.9% FLUSH
3.0000 mL | Freq: Two times a day (BID) | INTRAVENOUS | Status: DC
  Administered 2019-09-17 – 2019-09-19 (×4): 3 mL via INTRAVENOUS

## 2019-09-17 MED ORDER — LIDOCAINE-EPINEPHRINE 1 %-1:100000 IJ SOLN
INTRAMUSCULAR | Status: DC | PRN
  Administered 2019-09-17: 7 mL

## 2019-09-17 MED ORDER — DOCUSATE SODIUM 100 MG PO CAPS
100.0000 mg | ORAL_CAPSULE | Freq: Two times a day (BID) | ORAL | Status: DC
  Filled 2019-09-17 (×2): qty 1

## 2019-09-17 MED ORDER — POLYETHYLENE GLYCOL 3350 17 G PO PACK: 17.0000 g | PACK | Freq: Every day | ORAL | Status: DC | PRN

## 2019-09-17 MED ORDER — ONDANSETRON HCL 4 MG/2ML IJ SOLN
INTRAMUSCULAR | Status: DC | PRN
  Administered 2019-09-17: 4 mg via INTRAVENOUS

## 2019-09-17 MED ORDER — ONDANSETRON HCL 4 MG/2ML IJ SOLN
INTRAMUSCULAR | Status: AC
  Filled 2019-09-17: qty 2

## 2019-09-17 MED ORDER — CEFAZOLIN SODIUM-DEXTROSE 2-4 GM/100ML-% IV SOLN
2.0000 g | INTRAVENOUS | Status: AC
  Administered 2019-09-17: 2 g via INTRAVENOUS
  Filled 2019-09-17: qty 100

## 2019-09-17 MED ORDER — ROCURONIUM BROMIDE 100 MG/10ML IV SOLN
INTRAVENOUS | Status: DC | PRN
  Administered 2019-09-17: 50 mg via INTRAVENOUS

## 2019-09-17 MED ORDER — PROPOFOL 10 MG/ML IV BOLUS
INTRAVENOUS | Status: AC
  Filled 2019-09-17: qty 20

## 2019-09-17 MED ORDER — ONDANSETRON HCL 4 MG PO TABS: 4.0000 mg | ORAL_TABLET | Freq: Four times a day (QID) | ORAL | Status: DC | PRN

## 2019-09-17 MED ORDER — PROPOFOL 10 MG/ML IV BOLUS
INTRAVENOUS | Status: DC | PRN
  Administered 2019-09-17: 160 mg via INTRAVENOUS

## 2019-09-17 MED ORDER — ONDANSETRON HCL 4 MG/2ML IJ SOLN: 4.0000 mg | Freq: Four times a day (QID) | INTRAMUSCULAR | Status: DC | PRN

## 2019-09-17 SURGICAL SUPPLY — 39 items
ADH SKN CLS APL DERMABOND .7 (GAUZE/BANDAGES/DRESSINGS) ×1
BLADE CLIPPER SURG (BLADE) ×2 IMPLANT
BLADE SURG 15 STRL LF DISP TIS (BLADE) ×1 IMPLANT
BLADE SURG 15 STRL SS (BLADE) ×3
CATH LUMBAR HERMETIC 14G (CATHETERS) IMPLANT
CATHETER LUMBAR HERMETIC 14G (CATHETERS) ×3
DERMABOND ADVANCED (GAUZE/BANDAGES/DRESSINGS) ×2
DERMABOND ADVANCED .7 DNX12 (GAUZE/BANDAGES/DRESSINGS) ×1 IMPLANT
DRAPE C-ARM 42X72 X-RAY (DRAPES) ×5 IMPLANT
DRAPE HALF SHEET 40X57 (DRAPES) ×5 IMPLANT
DRAPE INCISE IOBAN 66X45 STRL (DRAPES) ×2 IMPLANT
DRAPE LAPAROTOMY 100X72X124 (DRAPES) ×3 IMPLANT
DRSG TEGADERM 4X4.5 CHG (GAUZE/BANDAGES/DRESSINGS) ×2 IMPLANT
DURAPREP 26ML APPLICATOR (WOUND CARE) ×3 IMPLANT
GAUZE 4X4 16PLY RFD (DISPOSABLE) ×3 IMPLANT
GLOVE BIO SURGEON STRL SZ7.5 (GLOVE) ×4 IMPLANT
GLOVE BIOGEL PI IND STRL 7.0 (GLOVE) IMPLANT
GLOVE BIOGEL PI IND STRL 7.5 (GLOVE) ×2 IMPLANT
GLOVE BIOGEL PI INDICATOR 7.0 (GLOVE) ×4
GLOVE BIOGEL PI INDICATOR 7.5 (GLOVE) ×8
GLOVE SS N UNI LF 6.5 STRL (GLOVE) ×4 IMPLANT
GLOVE SS N UNI LF 7.0 STRL (GLOVE) ×4 IMPLANT
GLOVE SS N UNI LF 7.5 STRL (GLOVE) ×4 IMPLANT
GOWN STRL REUS W/ TWL LRG LVL3 (GOWN DISPOSABLE) ×2 IMPLANT
GOWN STRL REUS W/ TWL XL LVL3 (GOWN DISPOSABLE) IMPLANT
GOWN STRL REUS W/TWL 2XL LVL3 (GOWN DISPOSABLE) ×6 IMPLANT
GOWN STRL REUS W/TWL LRG LVL3 (GOWN DISPOSABLE) ×6
GOWN STRL REUS W/TWL XL LVL3 (GOWN DISPOSABLE)
KIT BASIN OR (CUSTOM PROCEDURE TRAY) ×3 IMPLANT
KIT TURNOVER KIT B (KITS) ×3 IMPLANT
NDL HYPO 25X1 1.5 SAFETY (NEEDLE) ×1 IMPLANT
NEEDLE HYPO 25X1 1.5 SAFETY (NEEDLE) ×3 IMPLANT
NS IRRIG 1000ML POUR BTL (IV SOLUTION) ×3 IMPLANT
PACK SURGICAL SETUP 50X90 (CUSTOM PROCEDURE TRAY) ×3 IMPLANT
SUT ETHILON 3 0 FSL (SUTURE) ×2 IMPLANT
SUT VICRYL 3-0 RB1 18 ABS (SUTURE) ×3 IMPLANT
SYR CONTROL 10ML LL (SYRINGE) ×6 IMPLANT
TOWEL GREEN STERILE (TOWEL DISPOSABLE) ×3 IMPLANT
TOWEL GREEN STERILE FF (TOWEL DISPOSABLE) ×3 IMPLANT

## 2019-09-17 NOTE — Anesthesia Postprocedure Evaluation (Signed)
Anesthesia Post Note  Patient: Andrew Hayes  Procedure(s) Performed: PLACEMENT OF LUMBAR DRAIN (N/A Spine Lumbar)     Patient location during evaluation: PACU Anesthesia Type: General Level of consciousness: awake and alert Pain management: pain level controlled Vital Signs Assessment: post-procedure vital signs reviewed and stable Respiratory status: spontaneous breathing, nonlabored ventilation, respiratory function stable and patient connected to nasal cannula oxygen Cardiovascular status: blood pressure returned to baseline and stable Postop Assessment: no apparent nausea or vomiting Anesthetic complications: no    Last Vitals:  Vitals:   09/17/19 1700 09/17/19 1800  BP:  128/67  Pulse: 79 72  Resp: 18 14  Temp:    SpO2: 96% 95%    Last Pain:  Vitals:   09/17/19 1600  TempSrc: Oral  PainSc: 0-No pain                 Gretchen Weinfeld COKER

## 2019-09-17 NOTE — Op Note (Signed)
PATIENT: Andrew Hayes  DAY OF SURGERY: 09/17/19   PRE-OPERATIVE DIAGNOSIS:  Normal pressure hydrocephalus   POST-OPERATIVE DIAGNOSIS:  Same   PROCEDURE:  Lumbar drain placement   SURGEON:  Surgeon(s) and Role:    Judith Part, MD - Primary   ANESTHESIA: ETGA   BRIEF HISTORY: This is a 78 year old man who presented with progressive gait disturbance and ventriculomegaly in the setting of a family history of NPH. A trial of lumbar drain placement for CSF diversion was recommended to see if he would be a possible candidate for shunt placement. This was discussed with the patient as well as risks, benefits, and alternatives and wished to proceed with surgery.   OPERATIVE DETAIL: The patient was taken to the operating room and placed on the OR table in the prone position. A formal time out was performed with two patient identifiers and confirmed the operative site. Anesthesia was induced by the anesthesia team. The operative site was marked, hair was clipped with surgical clippers, the area was then prepped and draped in a sterile fashion. Using fluoroscopy, the needle was placed into the L4-5 interspace with good flow of clear CSF. The stylet was removed and the lumbar drain was then threaded without resistance. Position was confirmed on fluoroscopy, the drain was secured with suture, and a sterile dressing was placed. There was good spontaneous flow from the catheter after it was secured. All instrument and sponge counts were correct. The patient was then returned to anesthesia for emergence. No apparent complications at the completion of the procedure.   EBL:  46mL   DRAINS: Lumbar subarachnoid drain   SPECIMENS: none   Judith Part, MD 09/17/19 11:09 AM

## 2019-09-17 NOTE — Transfer of Care (Signed)
Immediate Anesthesia Transfer of Care Note  Patient: Andrew Hayes  Procedure(s) Performed: PLACEMENT OF LUMBAR DRAIN (N/A Spine Lumbar)  Patient Location: PACU  Anesthesia Type:General  Level of Consciousness: awake, alert , oriented and patient cooperative  Airway & Oxygen Therapy: Patient Spontanous Breathing and Patient connected to nasal cannula oxygen  Post-op Assessment: Report given to RN, Post -op Vital signs reviewed and stable and Patient moving all extremities X 4  Post vital signs: Reviewed and stable  Last Vitals:  Vitals Value Taken Time  BP 157/80 09/17/19 1235  Temp    Pulse 73 09/17/19 1236  Resp 24 09/17/19 1236  SpO2 100 % 09/17/19 1236  Vitals shown include unvalidated device data.  Last Pain:  Vitals:   09/17/19 1045  TempSrc: Oral  PainSc:          Complications: No apparent anesthesia complications

## 2019-09-17 NOTE — Anesthesia Procedure Notes (Signed)
Procedure Name: Intubation Date/Time: 09/17/2019 11:47 AM Performed by: Oletta Lamas, CRNA Pre-anesthesia Checklist: Patient identified, Emergency Drugs available, Suction available and Patient being monitored Patient Re-evaluated:Patient Re-evaluated prior to induction Oxygen Delivery Method: Circle System Utilized Preoxygenation: Pre-oxygenation with 100% oxygen Induction Type: IV induction Ventilation: Mask ventilation without difficulty Laryngoscope Size: Miller and 3 Grade View: Grade I Tube type: Oral Tube size: 7.5 mm Number of attempts: 1 Airway Equipment and Method: Stylet Placement Confirmation: ETT inserted through vocal cords under direct vision,  positive ETCO2 and breath sounds checked- equal and bilateral Secured at: 22 cm Tube secured with: Tape Dental Injury: Teeth and Oropharynx as per pre-operative assessment

## 2019-09-17 NOTE — H&P (Signed)
Surgical H&P Update  HPI: 78 y.o. man with progressive worsening of gait with some mild cognitive changes, here for lumbar drain placement for possible NPH. Still having symptoms and wishes to proceed with surgery.  PMHx:  Past Medical History:  Diagnosis Date  . Actinic keratoses   . Diverticulosis    Mild  . Elevated fasting glucose    Mildly  . Herniated disc   . Junctional nevus   . Lipidemia    Mixed  . Lyme disease 2020   pt placed on abx and stated he stopped taking vitamins so as not to interfere.  . Mild sleep apnea    CPAP  . Organic impotence   . Plantar fasciitis   . Prostate cancer ([place])    FamHx:  Family History  Problem Relation Age of Onset  . Stroke Mother   . Hypertension Mother   . Parkinson's disease Mother   . COPD Father   . Dementia Brother   . Hydrocephalus Brother   . Parkinson's disease Brother   . Parkinson's disease Sister   . Colon cancer Neg Hx   . Migraines Neg Hx        none to his knowledge   SocHx:  reports that he quit smoking about 34 years ago. His smoking use included cigarettes. He has a 48.00 pack-year smoking history. He has quit using smokeless tobacco. He reports current alcohol use of about 7.0 standard drinks of alcohol per week. He reports that he does not use drugs.  Physical Exam: AOx3, PERRL, FS, TM  Strength 5/5 x4, SILTx4  Assesment/Plan: 78 y.o. man with possible ventriculomegaly and progressive gait disturbance and mild cognitive changes, here for lumbar drain placement. Risks, benefits, and alternatives discussed and the patient would like to continue with surgery.  -OR today -4N post-op  Judith Part, MD 09/17/19 11:07 AM

## 2019-09-17 NOTE — Anesthesia Preprocedure Evaluation (Signed)
Anesthesia Evaluation  Patient identified by MRN, date of birth, ID band Patient awake    Reviewed: Allergy & Precautions, NPO status , Patient's Chart, lab work & pertinent test results  Airway Mallampati: II  TM Distance: >3 FB Neck ROM: Full    Dental  (+) Teeth Intact, Dental Advisory Given   Pulmonary former smoker,    breath sounds clear to auscultation       Cardiovascular  Rhythm:Regular Rate:Normal     Neuro/Psych    GI/Hepatic   Endo/Other    Renal/GU      Musculoskeletal   Abdominal   Peds  Hematology   Anesthesia Other Findings   Reproductive/Obstetrics                             Anesthesia Physical Anesthesia Plan  ASA: II  Anesthesia Plan: General   Post-op Pain Management:    Induction: Intravenous  PONV Risk Score and Plan: Ondansetron and Dexamethasone  Airway Management Planned: Oral ETT  Additional Equipment:   Intra-op Plan:   Post-operative Plan: Extubation in OR  Informed Consent: I have reviewed the patients History and Physical, chart, labs and discussed the procedure including the risks, benefits and alternatives for the proposed anesthesia with the patient or authorized representative who has indicated his/her understanding and acceptance.     Dental advisory given  Plan Discussed with: CRNA and Anesthesiologist  Anesthesia Plan Comments:         Anesthesia Quick Evaluation  

## 2019-09-18 ENCOUNTER — Encounter (HOSPITAL_COMMUNITY): Payer: Self-pay | Admitting: Neurological Surgery

## 2019-09-18 NOTE — Progress Notes (Signed)
Neurosurgery Service Progress Note  Subjective: No acute events overnight, no headaches   Objective: Vitals:   09/18/19 0400 09/18/19 0500 09/18/19 0600 09/18/19 0700  BP: (!) 105/47 (!) 120/54 (!) 107/55 (!) 119/95  Pulse: (!) 56 (!) 55 (!) 57 (!) 57  Resp: 13 12 12 13   Temp: 98.9 F (37.2 C)     TempSrc: Axillary     SpO2: 96% 96% 96% 95%  Weight:      Height:       Temp (24hrs), Avg:97.9 F (36.6 C), Min:97.2 F (36.2 C), Max:98.9 F (37.2 C)  CBC Latest Ref Rng & Units 09/17/2019 09/13/2019  WBC 4.0 - 10.5 K/uL 6.5 6.8  Hemoglobin 13.0 - 17.0 g/dL 14.7 14.3  Hematocrit 39.0 - 52.0 % 44.0 43.2  Platelets 150 - 400 K/uL 212 211   BMP Latest Ref Rng & Units 09/17/2019 09/13/2019 05/21/2019  Glucose 70 - 99 mg/dL - 88 91  BUN 8 - 23 mg/dL - 18 21  Creatinine 0.61 - 1.24 mg/dL 1.10 0.86 0.98  BUN/Creat Ratio 10 - 24 - - 21  Sodium 135 - 145 mmol/L - 140 143  Potassium 3.5 - 5.1 mmol/L - 3.8 4.3  Chloride 98 - 111 mmol/L - 107 106  CO2 22 - 32 mmol/L - 25 23  Calcium 8.9 - 10.3 mg/dL - 9.1 9.6    Intake/Output Summary (Last 24 hours) at 09/18/2019 0730 Last data filed at 09/18/2019 0600 Gross per 24 hour  Intake 1250.39 ml  Output 574 ml  Net 676.39 ml    Current Facility-Administered Medications:  .  0.9 %  sodium chloride infusion, 250 mL, Intravenous, Continuous, Venise Ellingwood A, MD, Last Rate: 1 mL/hr at 09/18/19 0600 .  acetaminophen (TYLENOL) tablet 650 mg, 650 mg, Oral, Q4H PRN **OR** acetaminophen (TYLENOL) suppository 650 mg, 650 mg, Rectal, Q4H PRN, Judith Part, MD .  atorvastatin (LIPITOR) tablet 10 mg, 10 mg, Oral, Daily, Nicko Daher A, MD .  Chlorhexidine Gluconate Cloth 2 % PADS 6 each, 6 each, Topical, Q0600, Judith Part, MD .  diphenhydrAMINE (BENADRYL) capsule 25-50 mg, 25-50 mg, Oral, QHS PRN, Reinaldo Meeker, Meghan D, NP, 50 mg at 09/17/19 2316 .  docusate sodium (COLACE) capsule 100 mg, 100 mg, Oral, BID, Caleigha Zale A, MD .   heparin injection 5,000 Units, 5,000 Units, Subcutaneous, Q8H, Judith Part, MD, 5,000 Units at 09/18/19 0504 .  HYDROmorphone (DILAUDID) injection 0.5 mg, 0.5 mg, Intravenous, Q3H PRN, Judith Part, MD .  meclizine (ANTIVERT) tablet 25 mg, 25 mg, Oral, Q6H PRN, Onesty Clair, Joyice Faster, MD .  menthol-cetylpyridinium (CEPACOL) lozenge 3 mg, 1 lozenge, Oral, PRN **OR** phenol (CHLORASEPTIC) mouth spray 1 spray, 1 spray, Mouth/Throat, PRN, Janica Eldred A, MD .  ondansetron (ZOFRAN) tablet 4 mg, 4 mg, Oral, Q6H PRN **OR** ondansetron (ZOFRAN) injection 4 mg, 4 mg, Intravenous, Q6H PRN, Joseluis Alessio A, MD .  oxyCODONE (Oxy IR/ROXICODONE) immediate release tablet 5 mg, 5 mg, Oral, Q4H PRN, Elyzabeth Goatley A, MD .  polyethylene glycol (MIRALAX / GLYCOLAX) packet 17 g, 17 g, Oral, Daily PRN, Elayne Gruver A, MD .  sodium chloride flush (NS) 0.9 % injection 3 mL, 3 mL, Intravenous, Q12H, Davidjames Blansett, Joyice Faster, MD, 3 mL at 09/17/19 2130 .  sodium chloride flush (NS) 0.9 % injection 3 mL, 3 mL, Intravenous, PRN, Elmin Wiederholt A, MD .  SUMAtriptan (IMITREX) tablet 100 mg, 100 mg, Oral, PRN, Judith Part, MD   Physical Exam: AOx3,  PERRL, EOMI, FS, Strength 5/5 x4, SILTx4 Drain w/ clear CSF, working well  Assessment & Plan: 78 y.o. man w/ possible NPH, s/p LD for CSF drainage trial.   -continue draining 15 cc/hr -PT/OT eval today to evaluate for response to drainage -SCDs/TEDs, SQH  Joyice Faster Deannah Rossi  09/18/19 7:30 AM

## 2019-09-18 NOTE — Progress Notes (Addendum)
Physical Therapy Evaluation Patient Details Name: Andrew Hayes MRN: [id number] DOB: December 02, 1940 Today's Date: 09/18/2019   History of Present Illness  This is a 78 year old man who presented with progressive gait disturbance and ventriculomegaly in the setting of a family history of NPH. A trial of lumbar drain placement for CSF diversion was recommended to see if he would be a possible candidate for shunt placement.      Clinical Impression  Pt is at baseline functioning and should be safe at home and caring for his wife. There are no further acute PT needs, hopefully testing will soon find what is the underlying cause for the several neurological signs he has.    Will sign off at this time.  Pt was eager to get up and witness the changes.  Although I have no pre-drain mobility to compare to the evaluation data, pt seemed under-whelmed by any noted changes.  Pt showed gross proximal weakness on the right and difficulty completing eccentric holds in a "lunge" posture.  Pt showed a mildly flat-footed contact to his gait pattern and was mildly halted with abrupt directional or momentum changes.  These were minor visually, but pt stated he could feel the abnormality as if he could not anticipate balance challenges and was reacting deliberately to challenges.  I feel there are signs suggestive of neuropathy and descriptions from patient that suggest a/brady-kinetic s/s.    Follow Up Recommendations No PT follow up    Equipment Recommendations  None recommended by PT    Recommendations for Other Services       Precautions / Restrictions Restrictions Weight Bearing Restrictions: No      Mobility  Bed Mobility Overal bed mobility: Independent                Transfers Overall transfer level: Independent                  Ambulation/Gait Ambulation/Gait assistance: Independent Gait Distance (Feet): 600 Feet Assistive device: None Gait Pattern/deviations: Step-through  pattern Gait velocity: pt is slow to make increases/changes in cadence.   General Gait Details: mildly flat-foot gait pattern, slightly wider gait.  Minor hesitation with abrupt changes.  Pt reports he is still having to think deliberately about making balance adjustments.  There are mild deviations that pt manages well in a homelike environment   Stairs Stairs: Yes Stairs assistance: Independent Stair Management: No rails;Forwards;Alternating pattern Number of Stairs: 4    Wheelchair Mobility    Modified Rankin (Stroke Patients Only)       Balance Overall balance assessment: Independent                                           Pertinent Vitals/Pain Pain Assessment: No/denies pain    Home Living Family/patient expects to be discharged to:: Private residence Living Arrangements: Spouse/significant other Available Help at Discharge: Family;Available PRN/intermittently Type of Home: House Home Access: Stairs to enter Entrance Stairs-Rails: Can reach both   Home Layout: Two level;Bed/bath upstairs Home Equipment: Bedside commode      Prior Function Level of Independence: Independent with assistive device(s);Independent(uses wading stick during trout fishing)         Comments: drives; caregiver for spouse as she is not physically moving very well; walking stick/wading staf     Hand Dominance   Dominant Hand: Right    Extremity/Trunk  Assessment   Upper Extremity Assessment Upper Extremity Assessment: Defer to OT evaluation RUE Coordination: decreased fine motor LUE Coordination: decreased fine motor    Lower Extremity Assessment Lower Extremity Assessment: Overall WFL for tasks assessed;RLE deficits/detail(peripheral neurpathy signs.) RLE Deficits / Details: WHL isolated, but general weakness gross leg extension (press),  mild incoordination with fine motor.  signs of sensory peripheral neuropathy RLE Coordination: decreased fine motor        Communication   Communication: No difficulties  Cognition Arousal/Alertness: Awake/alert Behavior During Therapy: WFL for tasks assessed/performed Overall Cognitive Status: Within Functional Limits for tasks assessed                                        General Comments General comments (skin integrity, edema, etc.): There were mild deviations to gait, no overt instability.  Pt reports decreased anticipatory movement and more reactionary to balance issues.  Pt also reports s/s that suggest neuropathy.  There was not definitive signs to suggest NPH    Exercises     Assessment/Plan    PT Assessment Patent does not need any further PT services  PT Problem List         PT Treatment Interventions      PT Goals (Current goals can be found in the Care Plan section)  Acute Rehab PT Goals Patient Stated Goal: Hoping to get a shunt PT Goal Formulation: All assessment and education complete, DC therapy    Frequency     Barriers to discharge        Co-evaluation               AM-PAC PT "6 Clicks" Mobility  Outcome Measure Help needed turning from your back to your side while in a flat bed without using bedrails?: None Help needed moving from lying on your back to sitting on the side of a flat bed without using bedrails?: None Help needed moving to and from a bed to a chair (including a wheelchair)?: None Help needed standing up from a chair using your arms (e.g., wheelchair or bedside chair)?: None Help needed to walk in hospital room?: None Help needed climbing 3-5 steps with a railing? : None 6 Click Score: 24    End of Session   Activity Tolerance: Patient tolerated treatment well Patient left: Other (comment)(up standing talking to the OT)   PT Visit Diagnosis: Other symptoms and signs involving the nervous system [id number])    Time: [id number] PT Time Calculation (min) (ACUTE ONLY): 53 min   Charges:   PT Evaluation $PT Eval Moderate  Complexity: 1 Mod PT Treatments $Gait Training: 8-22 mins $Neuromuscular Re-education: 8-22 mins        09/18/2019  Donnella Sham, [place] [phone]  (pager) [phone]  (office)  Andrew Hayes 09/18/2019, 3:30 PM

## 2019-09-18 NOTE — Evaluation (Signed)
Occupational Therapy Evaluation Patient Details Name: Andrew Hayes MRN: [id number] DOB: [birth year] Today's Date: 09/18/2019    History of Present Illness This is a 78 year old man who presented with progressive gait disturbance and ventriculomegaly in the setting of a family history of NPH. A trial of lumbar drain placement for CSF diversion was recommended to see if he would be a possible candidate for shunt placement.    Clinical Impression   Pt PTA: Living with spouse, enjoys fly fishing, wading in water up to knees. Pt is his spouse's main caregiver as she physically does not move well. Pt currently doffing socks in standing; donning socks in sitting; no focal deficits for ADL noted. Pt ambulating well and picking up item from floor with golfer technique and by squatting without assist with no difficulty. Pt does not require continued OT. OT signing off. (drain clamped and RN aware to unclamp once OT finished).     Follow Up Recommendations  No OT follow up    Equipment Recommendations  None recommended by OT    Recommendations for Other Services       Precautions / Restrictions Precautions Precautions: Other (comment) Precaution Comments: lumbar drain clamping Restrictions Weight Bearing Restrictions: No      Mobility Bed Mobility Overal bed mobility: Independent                Transfers Overall transfer level: Independent                    Balance Overall balance assessment: Independent                                         ADL either performed or assessed with clinical judgement   ADL Overall ADL's : At baseline;Modified independent                                       General ADL Comments: Pt doffing socks in standing; donning socks in sitting; no focal deficits for ADL noted. Pt ambulating well and picking up item from floor without assist.     Vision Baseline Vision/History: Wears glasses Wears  Glasses: At all times Patient Visual Report: No change from baseline Vision Assessment?: No apparent visual deficits     Perception     Praxis      Pertinent Vitals/Pain Pain Assessment: No/denies pain     Hand Dominance Right   Extremity/Trunk Assessment Upper Extremity Assessment Upper Extremity Assessment: Generalized weakness RUE Deficits / Details: AROM, WFLs; MMT 5/5 RUE Coordination: decreased fine motor LUE Deficits / Details: rotator cuff injury; below shoulder movements, WFLs. LUE Coordination: decreased fine motor   Lower Extremity Assessment Lower Extremity Assessment: Overall WFL for tasks assessed;Defer to PT evaluation RLE Deficits / Details: WHL isolated, but general weakness gross leg extension (press),  mild incoordination with fine motor.  signs of sensory peripheral neuropathy RLE Coordination: decreased fine motor   Cervical / Trunk Assessment Cervical / Trunk Assessment: Normal   Communication Communication Communication: No difficulties   Cognition Arousal/Alertness: Awake/alert Behavior During Therapy: WFL for tasks assessed/performed Overall Cognitive Status: Within Functional Limits for tasks assessed  General Comments  Pt picking item from floor with no difficulty. No weakness noted. Pt mentioninng "spongey" feeling under feet that keeps him from feeling balanced.    Exercises     Shoulder Instructions      Home Living Family/patient expects to be discharged to:: Private residence Living Arrangements: Spouse/significant other Available Help at Discharge: Family;Available PRN/intermittently Type of Home: House Home Access: Stairs to enter   Entrance Stairs-Rails: Can reach both Home Layout: Two level;Bed/bath upstairs     Bathroom Shower/Tub: Teacher, early years/pre: Standard     Home Equipment: Bedside commode          Prior Functioning/Environment Level of  Independence: Independent with assistive device(s)(uses wading staf)        Comments: drives; caregiver for spouse as she is not physically moving very well; walking stick/wading staf        OT Problem List: Decreased activity tolerance      OT Treatment/Interventions:      OT Goals(Current goals can be found in the care plan section) Acute Rehab OT Goals Patient Stated Goal: Hoping to get a shunt OT Goal Formulation: With patient  OT Frequency:     Barriers to D/C:            Co-evaluation              AM-PAC OT "6 Clicks" Daily Activity     Outcome Measure Help from another person eating meals?: None Help from another person taking care of personal grooming?: None Help from another person toileting, which includes using toliet, bedpan, or urinal?: None Help from another person bathing (including washing, rinsing, drying)?: None Help from another person to put on and taking off regular upper body clothing?: None Help from another person to put on and taking off regular lower body clothing?: None 6 Click Score: 24   End of Session Equipment Utilized During Treatment: Gait belt Nurse Communication: Mobility status  Activity Tolerance: Patient tolerated treatment well Patient left: in bed;with call bell/phone within reach  OT Visit Diagnosis: Unsteadiness on feet (R26.81)                Time: 1250-1320 OT Time Calculation (min): 30 min Charges:  OT General Charges $OT Visit: 1 Visit OT Evaluation $OT Eval Moderate Complexity: 1 Mod  Darryl Nestle) Marsa Aris OTR/L Acute Rehabilitation Services Pager: [phone] Office: [place] 09/18/2019, 3:59 PM

## 2019-09-19 MED ORDER — LIDOCAINE HCL (PF) 1 % IJ SOLN
INTRAMUSCULAR | Status: AC
  Filled 2019-09-19: qty 5

## 2019-09-19 MED ORDER — EPINEPHRINE PF 1 MG/ML IJ SOLN
INTRAMUSCULAR | Status: AC
  Filled 2019-09-19: qty 1

## 2019-09-19 MED ORDER — EPINEPHRINE 1 MG/10ML IJ SOSY
PREFILLED_SYRINGE | INTRAMUSCULAR | Status: AC
  Filled 2019-09-19: qty 10

## 2019-09-19 NOTE — Evaluation (Signed)
Physical Therapy Evaluation Patient Details Name: Andrew Hayes MRN: [id number] DOB: [birth year] Today's Date: 09/19/2019   History of Present Illness  This is a 78 year old man who presented with progressive gait disturbance and ventriculomegaly in the setting of a family history of NPH. A trial of lumbar drain placement for CSF diversion was recommended to see if he would be a possible candidate for shunt placement.   Clinical Impression  There is definitely enough CSF fluid in the bag to afford changes in function if there was NPH.  Pt subjectively doesn't see extreme changes and he still has to react to deviations in balance.  I feel there is something else underlying that is causing these changes.    Follow Up Recommendations No PT follow up    Equipment Recommendations  None recommended by PT    Recommendations for Other Services       Precautions / Restrictions Restrictions Weight Bearing Restrictions: No      Mobility  Bed Mobility Overal bed mobility: Independent                Transfers Overall transfer level: Independent                  Ambulation/Gait Ambulation/Gait assistance: Independent   Assistive device: None Gait Pattern/deviations: Step-through pattern Gait velocity: pt is slower to make increases/changes in cadence, but he still makes changes well and better than age approp. Gait velocity interpretation: >4.37 ft/sec, indicative of normal walking speed General Gait Details: Less flat foot, improved heel-toe.  Pt still notices having to react to balance deviation by making reactionary balance change.  Where he feels he used to make anticipatory changes.  Stairs   Stairs assistance: Independent Stair Management: No rails;Forwards;Alternating pattern      Wheelchair Mobility    Modified Rankin (Stroke Patients Only)       Balance Overall balance assessment: Independent                                          Pertinent Vitals/Pain      Home Living Family/patient expects to be discharged to:: Private residence Living Arrangements: Spouse/significant other Available Help at Discharge: Family;Available PRN/intermittently Type of Home: House Home Access: Stairs to enter Entrance Stairs-Rails: Can reach both   Home Layout: Two level;Bed/bath upstairs Home Equipment: Bedside commode      Prior Function Level of Independence: Independent with assistive device(s);Independent(uses wading stick during trout fishing)         Comments: drives; caregiver for spouse as she is not physically moving very well; walking stick/wading staf     Hand Dominance   Dominant Hand: Right    Extremity/Trunk Assessment        Lower Extremity Assessment RLE Deficits / Details: WFL isolated, but general weakness gross leg extension (press),  mild incoordination with fine motor.  signs of sensory peripheral neuropathy RLE Coordination: decreased fine motor       Communication   Communication: No difficulties  Cognition Arousal/Alertness: Awake/alert Behavior During Therapy: WFL for tasks assessed/performed Overall Cognitive Status: Within Functional Limits for tasks assessed                                        General Comments  Exercises     Assessment/Plan    PT Assessment Patent does not need any further PT services  PT Problem List         PT Treatment Interventions      PT Goals (Current goals can be found in the Care Plan section)  Acute Rehab PT Goals Patient Stated Goal: Hoping to get a shunt PT Goal Formulation: All assessment and education complete, DC therapy    Frequency     Barriers to discharge        Co-evaluation               AM-PAC PT "6 Clicks" Mobility  Outcome Measure Help needed turning from your back to your side while in a flat bed without using bedrails?: None Help needed moving from lying on your back to sitting on  the side of a flat bed without using bedrails?: None Help needed moving to and from a bed to a chair (including a wheelchair)?: None Help needed standing up from a chair using your arms (e.g., wheelchair or bedside chair)?: None Help needed to walk in hospital room?: None Help needed climbing 3-5 steps with a railing? : None 6 Click Score: 24    End of Session   Activity Tolerance: Patient tolerated treatment well Patient left: Other (comment)(up standing talking to the OT)   PT Visit Diagnosis: Other symptoms and signs involving the nervous system (R29.898)    Time: [id number] PT Time Calculation (min) (ACUTE ONLY): 24 min   Charges:   PT Evaluation $PT Re-evaluation: 1 Re-eval PT Treatments $Gait Training: 8-22 mins        09/19/2019  Donnella Sham, PT Acute Rehabilitation Services [phone]  (pager) [phone]  (office)  Tessie Fass Jakylah Bassinger 09/19/2019, 1:59 PM

## 2019-09-19 NOTE — Progress Notes (Signed)
Neurosurgery Service Progress Note  Subjective: No acute events overnight, no headaches, did not feel a significant difference with drainage  Objective: Vitals:   09/19/19 0500 09/19/19 0600 09/19/19 0700 09/19/19 0800  BP: (!) 115/57 117/61 (!) 106/54   Pulse: (!) 48 (!) 54 (!) 46   Resp: (!) 8 15 (!) 9   Temp:    97.8 F (36.6 C)  TempSrc:    Oral  SpO2: 96% 97% 97%   Weight:      Height:       Temp (24hrs), Avg:98.3 F (36.8 C), Min:97.8 F (36.6 C), Max:98.9 F (37.2 C)  CBC Latest Ref Rng & Units 09/17/2019 09/13/2019  WBC 4.0 - 10.5 K/uL 6.5 6.8  Hemoglobin 13.0 - 17.0 g/dL 14.7 14.3  Hematocrit 39.0 - 52.0 % 44.0 43.2  Platelets 150 - 400 K/uL 212 211   BMP Latest Ref Rng & Units 09/17/2019 09/13/2019 05/21/2019  Glucose 70 - 99 mg/dL - 88 91  BUN 8 - 23 mg/dL - 18 21  Creatinine 0.61 - 1.24 mg/dL 1.10 0.86 0.98  BUN/Creat Ratio 10 - 24 - - 21  Sodium 135 - 145 mmol/L - 140 143  Potassium 3.5 - 5.1 mmol/L - 3.8 4.3  Chloride 98 - 111 mmol/L - 107 106  CO2 22 - 32 mmol/L - 25 23  Calcium 8.9 - 10.3 mg/dL - 9.1 9.6    Intake/Output Summary (Last 24 hours) at 09/19/2019 0846 Last data filed at 09/19/2019 0700 Gross per 24 hour  Intake 9.3 ml  Output 321 ml  Net -311.7 ml    Current Facility-Administered Medications:  .  0.9 %  sodium chloride infusion, 250 mL, Intravenous, Continuous, Ostergard, Joyice Faster, MD, Stopped at 09/18/19 1217 .  acetaminophen (TYLENOL) tablet 650 mg, 650 mg, Oral, Q4H PRN **OR** acetaminophen (TYLENOL) suppository 650 mg, 650 mg, Rectal, Q4H PRN, Judith Part, MD .  atorvastatin (LIPITOR) tablet 10 mg, 10 mg, Oral, Daily, Ostergard, Thomas A, MD, 10 mg at 09/18/19 1038 .  Chlorhexidine Gluconate Cloth 2 % PADS 6 each, 6 each, Topical, Q0600, Ostergard, Thomas A, MD .  diphenhydrAMINE (BENADRYL) capsule 25-50 mg, 25-50 mg, Oral, QHS PRN, Reinaldo Meeker, Meghan D, NP, 50 mg at 09/18/19 2310 .  docusate sodium (COLACE) capsule 100 mg, 100 mg,  Oral, BID, Ostergard, Thomas A, MD .  heparin injection 5,000 Units, 5,000 Units, Subcutaneous, Q8H, Ostergard, Joyice Faster, MD, 5,000 Units at 09/18/19 2300 .  HYDROmorphone (DILAUDID) injection 0.5 mg, 0.5 mg, Intravenous, Q3H PRN, Judith Part, MD .  meclizine (ANTIVERT) tablet 25 mg, 25 mg, Oral, Q6H PRN, Ostergard, Joyice Faster, MD .  menthol-cetylpyridinium (CEPACOL) lozenge 3 mg, 1 lozenge, Oral, PRN **OR** phenol (CHLORASEPTIC) mouth spray 1 spray, 1 spray, Mouth/Throat, PRN, Ostergard, Thomas A, MD .  ondansetron (ZOFRAN) tablet 4 mg, 4 mg, Oral, Q6H PRN **OR** ondansetron (ZOFRAN) injection 4 mg, 4 mg, Intravenous, Q6H PRN, Ostergard, Thomas A, MD .  oxyCODONE (Oxy IR/ROXICODONE) immediate release tablet 5 mg, 5 mg, Oral, Q4H PRN, Ostergard, Thomas A, MD .  polyethylene glycol (MIRALAX / GLYCOLAX) packet 17 g, 17 g, Oral, Daily PRN, Ostergard, Thomas A, MD .  sodium chloride flush (NS) 0.9 % injection 3 mL, 3 mL, Intravenous, Q12H, Ostergard, Thomas A, MD, 3 mL at 09/18/19 2300 .  sodium chloride flush (NS) 0.9 % injection 3 mL, 3 mL, Intravenous, PRN, Ostergard, Thomas A, MD .  SUMAtriptan (IMITREX) tablet 100 mg, 100 mg, Oral, PRN, Ostergard,  Joyice Faster, MD   Physical Exam: AOx3, PERRL, EOMI, FS, Strength 5/5 x4, SILTx4 Drain w/ clear CSF, working well  Assessment & Plan: 78 y.o. man w/ possible NPH, s/p LD for CSF drainage trial.   -continue draining 15 cc/hr, will have pt work w/ PT this morning again, but I explained to him that this is likely a negative trial -d/c drain this afternoon then discharge home -SCDs/TEDs, hold SQH given pending discharge  Judith Part  09/19/19 8:46 AM

## 2019-09-19 NOTE — Discharge Instructions (Signed)
Discharge Instructions  No restriction in activities, increase your activity back to normal.   Your drain site has an absorbable suture in it that will naturally fall out over the next few weeks.  Okay to shower on the day of discharge. Use regular soap and water and try to be gentle when cleaning your incision.   Follow up with Dr. [name] in 2 weeks after discharge. If you do not already have a discharge appointment, please call his office at [phone] to schedule a follow up appointment. If you have any concerns or questions, please call the office and let us know. Schedule a follow up appointment with Dr. [name] to discuss the results of your spinal fluid drainage trial and to evaluate for next steps in the workup of your balance issues.

## 2019-09-19 NOTE — Discharge Summary (Signed)
Discharge Summary  Date of Admission: 09/17/2019  Date of Discharge: 09/19/19  Attending Physician: Emelda Brothers, MD  Hospital Course: Patient was admitted following an uncomplicated placement of a lumbar drain for NPH trial. He was recovered in PACU and transferred to 4N. He had 15cc drained per hour with good flow, but unfortunately did not have any subjective improvement with gait, which was also the conclusion by PT evaluation. His hospital course was uncomplicated, his drain was removed, and he was discharged home on 09/19/2019. He will will follow up in clinic with me in 2 weeks.  Neurologic exam at discharge:  AOx3, PERRL, EOMI, FS, TM Strength 5/5 x4, SILTx4  Discharge diagnosis: Ataxia  Judith Part, MD 09/19/19 8:49 AM

## 2019-09-20 ENCOUNTER — Telehealth: Payer: Self-pay | Admitting: Neurology

## 2019-09-20 NOTE — Telephone Encounter (Signed)
Pt called stating that he was in the ED and is needing to schedule an appt in the next couple of weeks to follow up on his hydrocephalus. Pt was informed of next available and pt states he does not want to wait that long and wants the RN or provider to call him. Please advise.

## 2019-09-20 NOTE — Telephone Encounter (Signed)
Spoke with pt. He is following up with Dr. [name] in 2 weeks. I have scheduled him for an appt next available with Dr. [name] to discuss the next steps on 10/15/19 @ 2:00 pm arrival 15-30 minutes early. Pt indicated this was not an emergency, but he did not want to wait until March because he doesn't feel the procedure helped him. He verbalized appreciation for the call.

## 2019-10-15 ENCOUNTER — Ambulatory Visit (INDEPENDENT_AMBULATORY_CARE_PROVIDER_SITE_OTHER): Payer: PPO | Admitting: Neurology

## 2019-10-15 ENCOUNTER — Other Ambulatory Visit: Payer: Self-pay

## 2019-10-15 DIAGNOSIS — R9089 Other abnormal findings on diagnostic imaging of central nervous system: Secondary | ICD-10-CM

## 2019-10-15 NOTE — Progress Notes (Signed)
[facility]    Provider:  Dr [name] Requesting Provider: [name], MD Primary Care Provider:  [name], MD  CC:  Vestibular migraine  Interval history 10/16/2019: Patient for follow up after lumbar drain placement. Patient feels as though he improved as far as his balance, he definitely improved. It was not "drastic" but it was signigifant. I reminded him we are catching this early and so his improvement may not be as drastic as someone we diagnose in the later stages of NPH. Also discuss his episodes of dizziness may also be due to possible NPH ve vestibular migraines. We discussed this at length and he is going to see Dr [name].  Interval history 08/15/2019: Patient with episodes of dizziness unclear etiology. Possibly central associated with migraines. He has had 2 episodes and treated with migraine IV infusion of Depacon. We discussed migraine preventative, he says meats with nitrates cause a lot of problems and triggers episodes, He doesn't have a problem with bacon but feels lunch meat affects him particularly salami. He does not have these often. 3 years ago he was having them every few months, now he has had a few more. He declines preventative, he will try to take it sooner right at the onset he will take rizatriptan. We also discussed his MRI today, reviewed images, he has some memory problems, he feels his balance is poor. His brother has NPH. He feels his memory is impaired. Reviewed MRI images with him, showed him very large ventricles.  Interval history: Patient started experiencing vestibular symptoms several days ago. 2 days ago it put him in bed. Unclear if vestibular migraines or other etiology. Discussed we should take him to the IV infusion lab and treat him like a migraine and if symptoms resolve then this is a likely indication this is central in origin and not due to canaliths for example, I suspect vestibular migraines, discussed with patient and  agreed on treatment. In the furute he will call then symptoms starts for 1g depacon, today his symptoms resolved afte rtreatment which included a fogginess, dizzines, not feeling right in the head, imbalance.  HPI:  Andrew Hayes is a 79 y.o. male here as requested by [name], MD for dizziness. He was 79 years old with the first episode of vertigo, he has to lay down, a feeling of spinning and if he doesn't lay down perfectly flat keep his head on the bed he gets nauseated. Worst one was 4 years ago he was in bed for days and every time he got up he vomited. He has been evaluated by [facility] and multiple ENTs and diagnosed with vestibular migraines. He will episodes are a little dizziness, he takes rizatriptan and it does get very bad after that, but he has a "hangover" and may not feel good for a few days but he can avoid the severe vertigo and vomiting. He is getting these "little" episodes more frequently 2x a month that last for 4-5 days. No inciting events, he has evaluated all possible triggers and none found. He even tried a migraine diet for 3-4 months recommended by [facility] and didn;t help. He never gets headaches with the episodes. He has been to vestibular therapy. He drinks enough water, he has had brain MRIs, He is having more frequent episodes, he feels his balance is impaired and sometimes he does not walk well, hearing changes. No Fhx of migraines.  Reviewed notes, labs and imaging from outside physicians, which showed:  IMPRESSION:  MRI brain 2016 personally reviewed images 1. No imaging explanation for vestibular cochlear symptoms, as above. 2. Ventriculomegaly. Are there any symptoms of normal pressure hydrocephalus  Review of Systems: Patient complains of symptoms per HPI as well as the following symptoms: no falls. Pertinent negatives and positives per HPI. All others negative.   Social History   Socioeconomic History  . Marital status: Married    Spouse name: Hassan Rowan  .  Number of children: 2  . Years of education: 75  . Highest education level: Not on file  Occupational History  . Occupation: cpa    Employer: RETIRED    Comment: retired  Tobacco Use  . Smoking status: Former Smoker    Packs/day: 2.00    Years: 24.00    Pack years: 48.00    Types: Cigarettes    Quit date: 10/11/1984    Years since quitting: 35.0  . Smokeless tobacco: Former Systems developer  . Tobacco comment: briefly used smokeless tobacco  Substance and Sexual Activity  . Alcohol use: Yes    Alcohol/week: 7.0 standard drinks    Types: 7 Standard drinks or equivalent per week    Comment: 7 beers weekly  . Drug use: No  . Sexual activity: Not on file  Other Topics Concern  . Not on file  Social History Narrative   Patient lives at home with family.   Patient consumes about 2 cups of caffeine daily,   right handed.   2 children (1 deceased)   Social Determinants of Health   Financial Resource Strain:   . Difficulty of Paying Living Expenses: Not on file  Food Insecurity:   . Worried About Charity fundraiser in the Last Year: Not on file  . Ran Out of Food in the Last Year: Not on file  Transportation Needs:   . Lack of Transportation (Medical): Not on file  . Lack of Transportation (Non-Medical): Not on file  Physical Activity:   . Days of Exercise per Week: Not on file  . Minutes of Exercise per Session: Not on file  Stress:   . Feeling of Stress : Not on file  Social Connections:   . Frequency of Communication with Friends and Family: Not on file  . Frequency of Social Gatherings with Friends and Family: Not on file  . Attends Religious Services: Not on file  . Active Member of Clubs or Organizations: Not on file  . Attends Archivist Meetings: Not on file  . Marital Status: Not on file  Intimate Partner Violence:   . Fear of Current or Ex-Partner: Not on file  . Emotionally Abused: Not on file  . Physically Abused: Not on file  . Sexually Abused: Not on file      Family History  Problem Relation Age of Onset  . Stroke Mother   . Hypertension Mother   . Parkinson's disease Mother   . COPD Father   . Dementia Brother   . Hydrocephalus Brother   . Parkinson's disease Brother   . Parkinson's disease Sister   . Colon cancer Neg Hx   . Migraines Neg Hx        none to his knowledge    Past Medical History:  Diagnosis Date  . Actinic keratoses   . Diverticulosis    Mild  . Elevated fasting glucose    Mildly  . Herniated disc   . Junctional nevus   . Lipidemia    Mixed  . Lyme disease 2020  pt placed on abx and stated he stopped taking vitamins so as not to interfere.  . Mild sleep apnea    CPAP  . Organic impotence   . Plantar fasciitis   . Prostate cancer [facility])     Patient Active Problem List   Diagnosis Date Noted  . Normal pressure hydrocephalus ([place]) 09/17/2019  . Dizziness 08/15/2019  . Migraine without aura and without status migrainosus, not intractable 08/15/2019  . Vestibular migraine 06/25/2019  . Fever and chills 05/28/2019  . Impingement syndrome of left shoulder 08/10/2017  . Chronic left shoulder pain 07/21/2017  . Dysphagia 06/09/2015  . History of colonic polyps 06/09/2015  . Dizzy spells 03/08/2012  . Hyperlipidemia 03/08/2012    Past Surgical History:  Procedure Laterality Date  . APPENDECTOMY    . COLONOSCOPY    . ELBOW SURGERY  2013  . KNEE ARTHROSCOPY  2013   knee and elbow  . PLACEMENT OF LUMBAR DRAIN N/A 09/17/2019   Procedure: PLACEMENT OF LUMBAR DRAIN;  Surgeon: Judith Part, MD;  Location: [facility];  Service: Neurosurgery;  Laterality: N/A;  Lumbar drain placement  . PROSTATECTOMY  1986  . TONSILLECTOMY      Current Outpatient Medications  Medication Sig Dispense Refill  . aspirin EC 81 MG tablet Take 81 mg by mouth daily.    Marland Kitchen atorvastatin (LIPITOR) 10 MG tablet Take 10 mg by mouth daily.     . Cholecalciferol (VITAMIN D) 50 MCG (2000 UT) CAPS Take 4,000 Units by mouth daily.      . Coenzyme Q10 200 MG capsule Take 200 mg by mouth daily.     Marland Kitchen ibuprofen (ADVIL,MOTRIN) 200 MG tablet Take 400 mg by mouth every 6 (six) hours as needed for headache or moderate pain.     . meclizine (ANTIVERT) 25 MG tablet Take 1 tablet (25 mg total) by mouth every 6 (six) hours as needed for dizziness or nausea. 30 tablet 6  . Multiple Vitamin (MULTI-VITAMIN PO) Take 1 tablet by mouth daily.     . Omega-3 Fatty Acids (FISH OIL PO) Take 2,000 mg by mouth daily.     . rizatriptan (MAXALT-MLT) 10 MG disintegrating tablet Take 1 tablet (10 mg total) by mouth as needed for migraine. May repeat in 2 hours if needed 10 tablet 11   No current facility-administered medications for this visit.    Allergies as of 10/15/2019 - Review Complete 09/17/2019  Allergen Reaction Noted  . Percocet [oxycodone-acetaminophen] Other (See Comments) 05/23/2015    Vitals: There were no vitals taken for this visit. Last Weight:  Wt Readings from Last 1 Encounters:  09/17/19 184 lb (83.5 kg)   Last Height:   Ht Readings from Last 1 Encounters:  09/17/19 5\' 11"  (1.803 m)     Physical exam: Exam: Gen: NAD, conversant, well nourised, obese, well groomed                     CV: RRR, no MRG. No Carotid Bruits. No peripheral edema, warm, nontender Eyes: Conjunctivae clear without exudates or hemorrhage  Neuro: Detailed Neurologic Exam  Speech:    Speech is normal; fluent and spontaneous with normal comprehension.  Cognition:    The patient is oriented to person, place, and time;     recent and remote memory intact;     language fluent;     normal attention, concentration,     fund of knowledge Cranial Nerves:    The pupils are equal, round,  and reactive to light. The fundi are normal and spontaneous venous pulsations are present. Visual fields are full to finger confrontation. Extraocular movements are intact. Trigeminal sensation is intact and the muscles of mastication are normal. The face is  symmetric. The palate elevates in the midline. Hearing intact. Voice is normal. Shoulder shrug is normal. The tongue has normal motion without fasciculations.   Coordination:    Normal finger to nose and heel to shin. Normal rapid alternating movements.   Gait:    Heel-toe and tandem gait are normal.   Motor Observation:    No asymmetry, no atrophy, and no involuntary movements noted. Tone:    Normal muscle tone.    Posture:    Posture is normal. normal erect    Strength:    Strength is V/V in the upper and lower limbs.      Sensation: intact to LT     Reflex Exam:  DTR's:    Deep tendon reflexes in the upper and lower extremities are normal bilaterally.   Toes:    The toes are downgoing bilaterally.   Clonus:    Clonus is absent.    Assessment/Plan:  Patients with episodes of dizziness and vertigo. He has been extensively evaluated by ENT and possibly vestibular migraines however does not fit completely with this diagnosis (never had headache with the dizziness which is often present with at least some of the dizzy episodes). Imaging ruled out strokes, schwannomas, vascular etiologies, carotid stenosis. May be vestibular migraines.   - MRI shows large ventricles: Patient for follow up after lumbar drain placement. Patient feels as though he improved as far as his balance, he definitely improved. It was not "drastic" but it was signigifant. I reminded him we are catching this early and so his improvement may not be as drastic as someone we diagnose in the later stages of NPH. I would suggest an aggressive approach as ventricles are very large, as clinically warranted by Dr. [name]. He feels strange with gait, he had a thorough workup for neuropathy which was negative including emg/ncs.   - rizatriptan acutely, also he felt the IV infusion for migraines helped when he had his dizziness we can always do that again if needed. For now he has not had any severe episodes and I wonder  if these are related to NPH as well.  - I contacted Dr. [name] about this, patient will be seeing him soon   Cc: [name], MD  Sarina Ill, MD  [facility] [address] [place], [place] [address]  A total of 25  minutes was spent face-to-face with this patient. Over half this time was spent on counseling patient on the  1. Abnormal brain CT    diagnosis and different diagnostic and therapeutic options, counseling and coordination of care, risks ans benefits of management, compliance, or risk factor reduction and education.

## 2019-10-16 ENCOUNTER — Encounter: Payer: Self-pay | Admitting: Neurology

## 2019-10-17 DIAGNOSIS — G912 (Idiopathic) normal pressure hydrocephalus: Secondary | ICD-10-CM | POA: Diagnosis not present

## 2019-10-17 DIAGNOSIS — Z6825 Body mass index (BMI) 25.0-25.9, adult: Secondary | ICD-10-CM | POA: Diagnosis not present

## 2019-10-17 DIAGNOSIS — R030 Elevated blood-pressure reading, without diagnosis of hypertension: Secondary | ICD-10-CM | POA: Diagnosis not present

## 2019-10-28 ENCOUNTER — Ambulatory Visit: Payer: PPO | Attending: Internal Medicine

## 2019-10-28 DIAGNOSIS — Z23 Encounter for immunization: Secondary | ICD-10-CM | POA: Insufficient documentation

## 2019-10-28 NOTE — Progress Notes (Signed)
   Covid-19 Vaccination Clinic  Name:  Dalten Dembek    MRN: [id number] DOB: [birth year]  10/28/2019  Mr. Gayheart was observed post Covid-19 immunization for 15 minutes without incidence. He was provided with Vaccine Information Sheet and instruction to access the V-Safe system.   Mr. Harbottle was instructed to call 911 with any severe reactions post vaccine: Marland Kitchen Difficulty breathing  . Swelling of your face and throat  . A fast heartbeat  . A bad rash all over your body  . Dizziness and weakness    10:54

## 2019-11-18 ENCOUNTER — Ambulatory Visit: Payer: PPO | Attending: Internal Medicine

## 2019-11-18 DIAGNOSIS — Z23 Encounter for immunization: Secondary | ICD-10-CM

## 2019-11-18 NOTE — Progress Notes (Signed)
   Covid-19 Vaccination Clinic  Name:  Andrew Hayes    MRN: [id number] DOB: 20-Jul-1941  11/18/2019  [name] was observed post Covid-19 immunization for 15 minutes without incidence. He was provided with Vaccine Information Sheet and instruction to access the V-Safe system.   [name] was instructed to call 911 with any severe reactions post vaccine: Marland Kitchen Difficulty breathing  . Swelling of your face and throat  . A fast heartbeat  . A bad rash all over your body  . Dizziness and weakness    Immunizations Administered    Name Date Dose VIS Date Route   Pfizer COVID-19 Vaccine 11/18/2019 10:10 AM 0.3 mL 09/21/2019 Intramuscular   Manufacturer: [place]   Lot: [id number]   [place]: [id number]

## 2019-11-22 ENCOUNTER — Other Ambulatory Visit: Payer: Self-pay | Admitting: Neurological Surgery

## 2019-11-22 DIAGNOSIS — G912 (Idiopathic) normal pressure hydrocephalus: Secondary | ICD-10-CM | POA: Diagnosis not present

## 2019-11-22 DIAGNOSIS — R030 Elevated blood-pressure reading, without diagnosis of hypertension: Secondary | ICD-10-CM | POA: Diagnosis not present

## 2019-11-22 DIAGNOSIS — Z6825 Body mass index (BMI) 25.0-25.9, adult: Secondary | ICD-10-CM | POA: Diagnosis not present

## 2019-11-30 ENCOUNTER — Inpatient Hospital Stay (HOSPITAL_COMMUNITY): Admission: RE | Admit: 2019-11-30 | Payer: PPO | Source: Ambulatory Visit

## 2019-11-30 ENCOUNTER — Other Ambulatory Visit (HOSPITAL_COMMUNITY): Payer: PPO

## 2019-12-03 ENCOUNTER — Inpatient Hospital Stay (HOSPITAL_COMMUNITY): Admission: RE | Admit: 2019-12-03 | Payer: PPO | Source: Home / Self Care | Admitting: Neurological Surgery

## 2019-12-03 ENCOUNTER — Telehealth: Payer: Self-pay | Admitting: Neurology

## 2019-12-03 ENCOUNTER — Encounter (HOSPITAL_COMMUNITY): Admission: RE | Payer: Self-pay | Source: Home / Self Care

## 2019-12-03 SURGERY — SHUNT INSERTION VENTRICULAR-PERITONEAL
Anesthesia: General | Laterality: Right

## 2019-12-03 NOTE — Telephone Encounter (Signed)
Spoke with pt. He had an LP shunt scheduled for today but he canceled d/t concerns of potential decreased activities (I.e twisting limitations with golf). Pt stated Andrew Hayes wasn't considered this would affect it. However pt feels he is at a crossroads. He wants to talk to Andrew Hayes again. He is still open to the shunt and stated he might prefer VP over LP. He stated he is afraid his symptoms will get worse. He does feel his balance is worse, noted while playing golf. He is bothered by a spongy feeling under his feet ongoing for 3-4 years and asked if this could be pursued. He will also discuss with PCP, possibly ask for podiatry referral? He really wants to pursue this spongy feeling first. I let him know I would send a message to Andrew Hayes. He would like to be seen soon if possible. I gave him next available appt with Andrew Hayes (per pt request) for 01/15/20 @ 11:00 am.

## 2019-12-03 NOTE — Telephone Encounter (Signed)
I don;t have any advise on this, I am not a surgeon and therefore cannot advise him on the particular procedure outcomes based on which surgical intervention he decides on. He will have to follow Dr. Colleen Can guidance, I really cannot give him any advice on this, I really don;t think the 4/6 appointment is necessary. thanks.

## 2019-12-03 NOTE — Telephone Encounter (Signed)
Patient called request a CB from RN in regards to worsening symptoms and a sooner apt with MD   Please fu

## 2019-12-04 NOTE — Telephone Encounter (Signed)
I spoke with the patient and discussed Dr. Cathren Laine message with pt and he verbalized understanding that she cannot give advice on this and he needs to d/w Dr. [name]. Pt stated in the call that he was noted by PT to have R leg weakness while in the hospital and that he notices with exercise bands at home that his R hand seems a little weaker or shaky when using the bands. He said he also has noticed trouble buttoning his shirt for about 1.5 years and hand shaking when writing for 2-3 years. He stated he has a family history of parkinsons (mother, sister, brother) and he isn't sure if maybe his symptoms are not related to the hydrocephalus. I let him know given the new report of symptoms and him reporting concern of parkinson's I will send to Dr. [name]. During the call patient stated well let's just "let it sit" however given the fact that the patient is concerned I am sending to MD. I let him know I would call him back with Dr. Cathren Laine suggestion.

## 2019-12-05 NOTE — Telephone Encounter (Signed)
Called pt & LVM asking for call back. When pt calls back, if nurse unavailable, please give pt the message below from Dr. [name] dated today.

## 2019-12-05 NOTE — Telephone Encounter (Signed)
Spoke with the patient about his concerns. I advised of him what Dr. [name] said about his improvement with the lumbar drain and that she didn't notice any Parkinsonian symptoms. He was very relieved with this news. Patient stated that he would like to keep his appt that is scheduled 01/21/2020 with Dr. [name].    No call back required.

## 2019-12-05 NOTE — Telephone Encounter (Signed)
No problem, thank you

## 2019-12-05 NOTE — Telephone Encounter (Signed)
Called pt on mobile & LVM asking for call back.

## 2019-12-05 NOTE — Telephone Encounter (Signed)
I have examined patient in the past and did not notice any overt parkinsonian symptoms at this time. He improved with the lumbar drain, Parkinson's disease would not improve with a lumbar drain. He can keep his April 6th appointment if he likes. thanks.

## 2019-12-12 DIAGNOSIS — R2689 Other abnormalities of gait and mobility: Secondary | ICD-10-CM | POA: Diagnosis not present

## 2019-12-19 ENCOUNTER — Other Ambulatory Visit: Payer: Self-pay | Admitting: Neurological Surgery

## 2019-12-19 DIAGNOSIS — L821 Other seborrheic keratosis: Secondary | ICD-10-CM | POA: Diagnosis not present

## 2019-12-19 DIAGNOSIS — D225 Melanocytic nevi of trunk: Secondary | ICD-10-CM | POA: Diagnosis not present

## 2019-12-19 DIAGNOSIS — Z85828 Personal history of other malignant neoplasm of skin: Secondary | ICD-10-CM | POA: Diagnosis not present

## 2019-12-19 DIAGNOSIS — Z86018 Personal history of other benign neoplasm: Secondary | ICD-10-CM | POA: Diagnosis not present

## 2019-12-19 DIAGNOSIS — L578 Other skin changes due to chronic exposure to nonionizing radiation: Secondary | ICD-10-CM | POA: Diagnosis not present

## 2019-12-19 DIAGNOSIS — L570 Actinic keratosis: Secondary | ICD-10-CM | POA: Diagnosis not present

## 2019-12-20 ENCOUNTER — Telehealth: Payer: Self-pay | Admitting: *Deleted

## 2019-12-20 NOTE — Telephone Encounter (Signed)
We received a copy of a letter to pt from [facility]. Rizatriptan was filled, temporary supply. The letter states the medication is subject to a quantity limit of 12 tablets per 30 days unless PA is obtained. The current prescription is for 10 tablets per 30 days. No action needed.

## 2020-01-02 NOTE — Progress Notes (Signed)
[facility] [address], [place] - [place] [address] Phone: [phone] Fax: [phone]      Your procedure is scheduled on Monday, March 29th.  Report to [facility] Main Entrance "A" at 5:30 A.M., and check in at the Admitting office.  Call this number if you have problems the morning of surgery:  [phone]  Call [phone] if you have any questions prior to your surgery date Monday-Friday 8am-4pm    Remember:  Do not eat or drink after midnight the night before your surgery     Take these medicines the morning of surgery with A SIP OF WATER   Atorvastatin (Lipitor)  Meclizine (Antivert) - if needed  Maxalt - if needed   Follow your surgeon's instructions on when to stop Aspirin.  If no instructions were given by your surgeon then you will need to call the office to get those instructions.     As of today, stop taking all Aspirin (unless instructed by your doctor) and other Aspirin containing products, Vitamins, Fish Oils, and Herbal Medications. Also stop all NSAIDS i.e. Advil, Ibuprofen, Motrin, Aleve, Anaprox, Naproxen, BC, Goody Powders, and all Supplements.   No Smoking of any kind, Tobacco, or Alcohol products 24 hours prior to your procedure. If you use a CPAP at night, you may bring all equipment for your overnight stay.                        Do not wear jewelry            Do not wear lotions, powders, colognes, or deodorant.            Men may shave face and neck.            Do not bring valuables to the hospital.            [facility] is not responsible for any belongings or valuables.   Contacts, glasses, dentures or bridgework may not be worn into surgery.      For patients admitted to the hospital, discharge time will be determined by your treatment team.   Patients discharged the day of surgery will not be allowed to drive home, and someone needs to stay with them for 24  hours.    Special instructions:   [facility]- Preparing For Surgery  Before surgery, you can play an important role. Because skin is not sterile, your skin needs to be as free of germs as possible. You can reduce the number of germs on your skin by washing with CHG (chlorahexidine gluconate) Soap before surgery.  CHG is an antiseptic cleaner which kills germs and bonds with the skin to continue killing germs even after washing.    Oral Hygiene is also important to reduce your risk of infection.  Remember - BRUSH YOUR TEETH THE MORNING OF SURGERY WITH YOUR REGULAR TOOTHPASTE  Please do not use if you have an allergy to CHG or antibacterial soaps. If your skin becomes reddened/irritated stop using the CHG.  Do not shave (including legs and underarms) for at least 48 hours prior to first CHG shower. It is OK to shave your face.  Please follow these instructions carefully.   1. Shower the NIGHT BEFORE SURGERY and the MORNING OF SURGERY with CHG Soap.   2. If you chose to wash your hair, wash your hair first as usual with your normal shampoo.  3. After you  shampoo, rinse your hair and body thoroughly to remove the shampoo.  4. Use CHG as you would any other liquid soap. You can apply CHG directly to the skin and wash gently with a scrungie or a clean washcloth.   5. Apply the CHG Soap to your body ONLY FROM THE NECK DOWN.  Do not use on open wounds or open sores. Avoid contact with your eyes, ears, mouth and genitals (private parts). Wash Face and genitals (private parts)  with your normal soap.   6. Wash thoroughly, paying special attention to the area where your surgery will be performed.  7. Thoroughly rinse your body with warm water from the neck down.  8. DO NOT shower/wash with your normal soap after using and rinsing off the CHG Soap.  9. Pat yourself dry with a CLEAN TOWEL.  10. Wear CLEAN PAJAMAS to bed the night before surgery, wear comfortable clothes the morning of  surgery  11. Place CLEAN SHEETS on your bed the night of your first shower and DO NOT SLEEP WITH PETS.   Day of Surgery:   Do not apply any deodorants/lotions.  Please wear clean clothes to the hospital/surgery center.   Remember to brush your teeth WITH YOUR REGULAR TOOTHPASTE.   Please read over the following fact sheets that you were given.

## 2020-01-03 ENCOUNTER — Encounter (HOSPITAL_COMMUNITY)
Admission: RE | Admit: 2020-01-03 | Discharge: 2020-01-03 | Disposition: A | Discharge status: Home / Self Care | Payer: PPO | Source: Ambulatory Visit | Attending: Neurological Surgery | Admitting: Neurological Surgery

## 2020-01-03 ENCOUNTER — Encounter (HOSPITAL_COMMUNITY): Payer: Self-pay

## 2020-01-03 ENCOUNTER — Other Ambulatory Visit: Payer: Self-pay

## 2020-01-03 ENCOUNTER — Other Ambulatory Visit (HOSPITAL_COMMUNITY)
Admission: RE | Admit: 2020-01-03 | Discharge: 2020-01-03 | Disposition: A | Discharge status: Home / Self Care | Payer: PPO | Source: Ambulatory Visit | Attending: Neurological Surgery | Admitting: Neurological Surgery

## 2020-01-03 DIAGNOSIS — Z20822 Contact with and (suspected) exposure to covid-19: Secondary | ICD-10-CM | POA: Insufficient documentation

## 2020-01-03 DIAGNOSIS — Z7982 Long term (current) use of aspirin: Secondary | ICD-10-CM | POA: Insufficient documentation

## 2020-01-03 DIAGNOSIS — Z8546 Personal history of malignant neoplasm of prostate: Secondary | ICD-10-CM | POA: Diagnosis not present

## 2020-01-03 DIAGNOSIS — Z79899 Other long term (current) drug therapy: Secondary | ICD-10-CM | POA: Insufficient documentation

## 2020-01-03 DIAGNOSIS — G4733 Obstructive sleep apnea (adult) (pediatric): Secondary | ICD-10-CM | POA: Insufficient documentation

## 2020-01-03 DIAGNOSIS — Z01818 Encounter for other preprocedural examination: Secondary | ICD-10-CM | POA: Insufficient documentation

## 2020-01-03 DIAGNOSIS — G912 (Idiopathic) normal pressure hydrocephalus: Secondary | ICD-10-CM | POA: Diagnosis not present

## 2020-01-03 DIAGNOSIS — M1990 Unspecified osteoarthritis, unspecified site: Secondary | ICD-10-CM | POA: Diagnosis not present

## 2020-01-03 HISTORY — DX: Other migraine, not intractable, without status migrainosus: G43.809

## 2020-01-03 HISTORY — DX: Unspecified osteoarthritis, unspecified site: M19.90

## 2020-01-03 LAB — BASIC METABOLIC PANEL
Anion gap: 8 (ref 5–15)
BUN: 14 mg/dL (ref 8–23)
CO2: 26 mmol/L (ref 22–32)
Calcium: 9.6 mg/dL (ref 8.9–10.3)
Chloride: 109 mmol/L (ref 98–111)
Creatinine, Ser: 1.13 mg/dL (ref 0.61–1.24)
GFR calc Af Amer: >60 mL/min (ref >60)
GFR calc non Af Amer: >60 mL/min (ref >60)
Glucose, Bld: 70 mg/dL (ref 70–99)
Potassium: 3.8 mmol/L (ref 3.5–5.1)
Sodium: 143 mmol/L (ref 135–145)

## 2020-01-03 LAB — CBC
HCT: 46.8 % (ref 39.0–52.0)
Hemoglobin: 15.3 g/dL (ref 13.0–17.0)
MCH: 32.3 pg (ref 26.0–34.0)
MCHC: 32.7 g/dL (ref 30.0–36.0)
MCV: 98.7 fL (ref 80.0–100.0)
Platelets: 210 10*3/uL (ref 150–400)
RBC: 4.74 MIL/uL (ref 4.22–5.81)
RDW: 12.5 % (ref 11.5–15.5)
WBC: 5.4 10*3/uL (ref 4.0–10.5)
nRBC: 0 % (ref 0.0–0.2)

## 2020-01-03 LAB — SARS CORONAVIRUS 2 (TAT 6-24 HRS): SARS Coronavirus 2: NEGATIVE

## 2020-01-03 LAB — SURGICAL PCR SCREEN
MRSA, PCR: NEGATIVE
Staphylococcus aureus: NEGATIVE

## 2020-01-03 NOTE — Progress Notes (Signed)
PCP - [name], MD Cardiologist - Denies  PPM/ICD - Denies  Chest x-ray - N/A EKG - N/A Stress Test - 11/22/2011 ECHO - 2013 Cardiac Cath - Denies  Sleep Study - Yes CPAP - Yes  Patient denies being a diabetic.  Blood Thinner Instructions: N/A Aspirin Instructions: Per patient, last dose 12/17/19  ERAS Protcol - N/A PRE-SURGERY Ensure or G2- N/A  COVID TEST- 01/03/20   Anesthesia review: Yes, review ECHO and Stress test.  Patient denies shortness of breath, fever, cough and chest pain at PAT appointment   All instructions explained to the patient, with a verbal understanding of the material. Patient agrees to go over the instructions while at home for a better understanding. Patient also instructed to self quarantine after being tested for COVID-19. The opportunity to ask questions was provided.

## 2020-01-04 NOTE — Progress Notes (Signed)
Anesthesia Chart Review:   Case: [id number] Date/Time: 01/07/20 0715   Procedure: Right ventriculoperitoneal shunt placement (Right )   Anesthesia type: General   Pre-op diagnosis: Normal pressure hydrocephalus   Location: [facility] OR ROOM 20 / [place] [facility]   Surgeons: Judith Part, MD      DISCUSSION:  Pt is a 79 year old with hx: OSA, prostate cancer, normal pressure hydrocephalus   Saw cardiologist Peter Martinique, MD in 2013 for dizziness. Underwent treadmill stress test and echo.  From Dr. Doug Sou note 03/08/12: "On his exercise stress test he had an accentuated blood pressure response and occasional PVCs were noted. It was otherwise negative. His echocardiogram was normal." No further cardiac evaluation was recommended.    VS: BP (!) 146/80   Pulse 63   Temp 36.4 C (Oral)   Resp 18   Ht 5\' 11"  (1.803 m)   Wt 84.9 kg   SpO2 100%   BMI 26.12 kg/m   PROVIDERS: - PCP is [name], MD   LABS: Labs reviewed: Acceptable for surgery. (all labs ordered are listed, but only abnormal results are displayed)  Labs Reviewed  SURGICAL PCR SCREEN  BASIC METABOLIC PANEL  CBC     EKG: n/a   CV: >31 years old    Past Medical History:  Diagnosis Date  . Actinic keratoses   . Arthritis   . Diverticulosis    Mild  . Elevated fasting glucose    Mildly  . Herniated disc   . Junctional nevus   . Lipidemia    Mixed  . Lyme disease 2020   pt placed on abx and stated he stopped taking vitamins so as not to interfere.  . Mild sleep apnea    CPAP  . Organic impotence   . Plantar fasciitis   . Pneumonia 1966  . Prostate cancer ([place])   . Vestibular migraine     Past Surgical History:  Procedure Laterality Date  . APPENDECTOMY    . COLONOSCOPY    . ELBOW SURGERY  2013  . KNEE ARTHROSCOPY  2013   knee and elbow  . PLACEMENT OF LUMBAR DRAIN N/A 09/17/2019   Procedure: PLACEMENT OF LUMBAR DRAIN;  Surgeon: Judith Part, MD;  Location: [facility];  Service: Neurosurgery;   Laterality: N/A;  Lumbar drain placement  . PROSTATECTOMY  1986  . TONSILLECTOMY    . TONSILLECTOMY      MEDICATIONS: . aspirin EC 81 MG tablet  . atorvastatin (LIPITOR) 10 MG tablet  . Cholecalciferol (VITAMIN D) 50 MCG (2000 UT) CAPS  . Coenzyme Q10 200 MG capsule  . ibuprofen (ADVIL,MOTRIN) 200 MG tablet  . meclizine (ANTIVERT) 25 MG tablet  . Multiple Vitamin (MULTI-VITAMIN PO)  . Omega-3 Fatty Acids (FISH OIL PO)  . rizatriptan (MAXALT-MLT) 10 MG disintegrating tablet   No current facility-administered medications for this encounter.    If no changes, I anticipate pt can proceed with surgery as scheduled.   Willeen Cass, FNP-BC [facility]/Anesthesiology Phone: [phone] 01/04/2020 10:19 AM

## 2020-01-06 NOTE — Anesthesia Preprocedure Evaluation (Addendum)
Anesthesia Evaluation  Patient identified by MRN, date of birth, ID band Patient awake    Reviewed: Allergy & Precautions, NPO status , Patient's Chart, lab work & pertinent test results  Airway Mallampati: II  TM Distance: >3 FB Neck ROM: Full    Dental  (+) Dental Advisory Given   Pulmonary sleep apnea and Continuous Positive Airway Pressure Ventilation , former smoker,    breath sounds clear to auscultation       Cardiovascular negative cardio ROS   Rhythm:Regular Rate:Normal     Neuro/Psych  Headaches, NPH    GI/Hepatic negative GI ROS, Neg liver ROS,   Endo/Other  negative endocrine ROS  Renal/GU negative Renal ROS     Musculoskeletal  (+) Arthritis ,   Abdominal   Peds  Hematology negative hematology ROS (+)   Anesthesia Other Findings   Reproductive/Obstetrics                            Lab Results  Component Value Date   WBC 5.4 01/03/2020   HGB 15.3 01/03/2020   HCT 46.8 01/03/2020   MCV 98.7 01/03/2020   PLT 210 01/03/2020   Lab Results  Component Value Date   CREATININE 1.13 01/03/2020   BUN 14 01/03/2020   NA 143 01/03/2020   K 3.8 01/03/2020   CL 109 01/03/2020   CO2 26 01/03/2020    Anesthesia Physical Anesthesia Plan  ASA: III  Anesthesia Plan: General   Post-op Pain Management:    Induction: Intravenous  PONV Risk Score and Plan: 2 and Dexamethasone, Ondansetron and Treatment may vary due to age or medical condition  Airway Management Planned: Oral ETT  Additional Equipment:   Intra-op Plan:   Post-operative Plan: Extubation in OR  Informed Consent: I have reviewed the patients History and Physical, chart, labs and discussed the procedure including the risks, benefits and alternatives for the proposed anesthesia with the patient or authorized representative who has indicated his/her understanding and acceptance.     Dental advisory  given  Plan Discussed with: CRNA  Anesthesia Plan Comments:        Anesthesia Quick Evaluation

## 2020-01-07 ENCOUNTER — Encounter (HOSPITAL_COMMUNITY)
Admission: RE | Disposition: A | Discharge status: Home / Self Care | Payer: Self-pay | Source: Home / Self Care | Attending: Neurological Surgery

## 2020-01-07 ENCOUNTER — Other Ambulatory Visit: Payer: Self-pay

## 2020-01-07 ENCOUNTER — Encounter (HOSPITAL_COMMUNITY): Payer: Self-pay | Admitting: Neurological Surgery

## 2020-01-07 ENCOUNTER — Inpatient Hospital Stay (HOSPITAL_COMMUNITY): Payer: PPO | Admitting: Emergency Medicine

## 2020-01-07 ENCOUNTER — Inpatient Hospital Stay (HOSPITAL_COMMUNITY): Payer: PPO

## 2020-01-07 ENCOUNTER — Inpatient Hospital Stay (HOSPITAL_COMMUNITY): Payer: PPO | Admitting: Certified Registered"

## 2020-01-07 ENCOUNTER — Inpatient Hospital Stay (HOSPITAL_COMMUNITY)
Admission: RE | Admit: 2020-01-07 | Discharge: 2020-01-08 | DRG: 033 | Disposition: A | Discharge status: Home / Self Care | Payer: PPO | Attending: Neurological Surgery | Admitting: Neurological Surgery

## 2020-01-07 DIAGNOSIS — L570 Actinic keratosis: Secondary | ICD-10-CM | POA: Diagnosis present

## 2020-01-07 DIAGNOSIS — Z825 Family history of asthma and other chronic lower respiratory diseases: Secondary | ICD-10-CM

## 2020-01-07 DIAGNOSIS — Z8546 Personal history of malignant neoplasm of prostate: Secondary | ICD-10-CM

## 2020-01-07 DIAGNOSIS — Z8249 Family history of ischemic heart disease and other diseases of the circulatory system: Secondary | ICD-10-CM | POA: Diagnosis not present

## 2020-01-07 DIAGNOSIS — K5790 Diverticulosis of intestine, part unspecified, without perforation or abscess without bleeding: Secondary | ICD-10-CM | POA: Diagnosis not present

## 2020-01-07 DIAGNOSIS — Z823 Family history of stroke: Secondary | ICD-10-CM | POA: Diagnosis not present

## 2020-01-07 DIAGNOSIS — G912 (Idiopathic) normal pressure hydrocephalus: Principal | ICD-10-CM | POA: Diagnosis present

## 2020-01-07 DIAGNOSIS — E785 Hyperlipidemia, unspecified: Secondary | ICD-10-CM | POA: Diagnosis not present

## 2020-01-07 DIAGNOSIS — G4730 Sleep apnea, unspecified: Secondary | ICD-10-CM | POA: Diagnosis not present

## 2020-01-07 DIAGNOSIS — Z87891 Personal history of nicotine dependence: Secondary | ICD-10-CM | POA: Diagnosis not present

## 2020-01-07 DIAGNOSIS — M1990 Unspecified osteoarthritis, unspecified site: Secondary | ICD-10-CM | POA: Diagnosis not present

## 2020-01-07 DIAGNOSIS — Z820 Family history of epilepsy and other diseases of the nervous system: Secondary | ICD-10-CM | POA: Diagnosis not present

## 2020-01-07 DIAGNOSIS — Z982 Presence of cerebrospinal fluid drainage device: Secondary | ICD-10-CM | POA: Diagnosis not present

## 2020-01-07 HISTORY — PX: VENTRICULOPERITONEAL SHUNT: SHX204

## 2020-01-07 LAB — CBC
HCT: 43.6 % (ref 39.0–52.0)
Hemoglobin: 14.8 g/dL (ref 13.0–17.0)
MCH: 32.4 pg (ref 26.0–34.0)
MCHC: 33.9 g/dL (ref 30.0–36.0)
MCV: 95.4 fL (ref 80.0–100.0)
Platelets: 220 10*3/uL (ref 150–400)
RBC: 4.57 MIL/uL (ref 4.22–5.81)
RDW: 12.4 % (ref 11.5–15.5)
WBC: 10.5 10*3/uL (ref 4.0–10.5)
nRBC: 0 % (ref 0.0–0.2)

## 2020-01-07 LAB — CREATININE, SERUM
Creatinine, Ser: 1.17 mg/dL (ref 0.61–1.24)
GFR calc Af Amer: >60 mL/min (ref >60)
GFR calc non Af Amer: >60 mL/min (ref >60)

## 2020-01-07 SURGERY — SHUNT INSERTION VENTRICULAR-PERITONEAL
Anesthesia: General | Site: Head | Laterality: Right

## 2020-01-07 MED ORDER — MECLIZINE HCL 25 MG PO TABS: 25.0000 mg | ORAL_TABLET | Freq: Four times a day (QID) | ORAL | Status: DC | PRN

## 2020-01-07 MED ORDER — ONDANSETRON HCL 4 MG/2ML IJ SOLN
INTRAMUSCULAR | Status: DC | PRN
  Administered 2020-01-07: 4 mg via INTRAVENOUS

## 2020-01-07 MED ORDER — RIZATRIPTAN BENZOATE 10 MG PO TBDP: 10.0000 mg | ORAL_TABLET | ORAL | Status: DC | PRN

## 2020-01-07 MED ORDER — PHENYLEPHRINE 40 MCG/ML (10ML) SYRINGE FOR IV PUSH (FOR BLOOD PRESSURE SUPPORT)
PREFILLED_SYRINGE | INTRAVENOUS | Status: DC | PRN
  Administered 2020-01-07: 80 ug via INTRAVENOUS

## 2020-01-07 MED ORDER — ACETAMINOPHEN 325 MG PO TABS
ORAL_TABLET | ORAL | Status: AC
  Filled 2020-01-07: qty 2

## 2020-01-07 MED ORDER — POLYETHYLENE GLYCOL 3350 17 G PO PACK: 17.0000 g | PACK | Freq: Every day | ORAL | Status: DC | PRN

## 2020-01-07 MED ORDER — THROMBIN 5000 UNITS EX SOLR
OROMUCOSAL | Status: DC | PRN
  Administered 2020-01-07: 5 mL via TOPICAL

## 2020-01-07 MED ORDER — ONDANSETRON HCL 4 MG/2ML IJ SOLN: 4.0000 mg | Freq: Once | INTRAMUSCULAR | Status: DC | PRN

## 2020-01-07 MED ORDER — HYDROCODONE-ACETAMINOPHEN 5-325 MG PO TABS
ORAL_TABLET | ORAL | Status: AC
  Filled 2020-01-07: qty 1

## 2020-01-07 MED ORDER — LIDOCAINE-EPINEPHRINE 1 %-1:100000 IJ SOLN
INTRAMUSCULAR | Status: DC | PRN
  Administered 2020-01-07: 10 mL

## 2020-01-07 MED ORDER — FENTANYL CITRATE (PF) 100 MCG/2ML IJ SOLN
25.0000 ug | INTRAMUSCULAR | Status: DC | PRN
  Administered 2020-01-07: 50 ug via INTRAVENOUS

## 2020-01-07 MED ORDER — PROPOFOL 10 MG/ML IV BOLUS
INTRAVENOUS | Status: AC
  Filled 2020-01-07: qty 40

## 2020-01-07 MED ORDER — HEPARIN SODIUM (PORCINE) 5000 UNIT/ML IJ SOLN: 5000.0000 [IU] | Freq: Three times a day (TID) | INTRAMUSCULAR | Status: DC

## 2020-01-07 MED ORDER — PHENYLEPHRINE HCL-NACL 10-0.9 MG/250ML-% IV SOLN
INTRAVENOUS | Status: DC | PRN
  Administered 2020-01-07: 20 ug/min via INTRAVENOUS

## 2020-01-07 MED ORDER — DOCUSATE SODIUM 100 MG PO CAPS
100.0000 mg | ORAL_CAPSULE | Freq: Two times a day (BID) | ORAL | Status: DC
  Administered 2020-01-07: 100 mg via ORAL
  Filled 2020-01-07: qty 1

## 2020-01-07 MED ORDER — FAMOTIDINE IN NACL 20-0.9 MG/50ML-% IV SOLN
20.0000 mg | Freq: Two times a day (BID) | INTRAVENOUS | Status: DC
  Administered 2020-01-07: 20 mg via INTRAVENOUS
  Filled 2020-01-07 (×3): qty 50

## 2020-01-07 MED ORDER — LIDOCAINE 2% (20 MG/ML) 5 ML SYRINGE
INTRAMUSCULAR | Status: DC | PRN
  Administered 2020-01-07: 60 mg via INTRAVENOUS

## 2020-01-07 MED ORDER — PROMETHAZINE HCL 12.5 MG PO TABS
12.5000 mg | ORAL_TABLET | ORAL | Status: DC | PRN
  Filled 2020-01-07: qty 2

## 2020-01-07 MED ORDER — SUMATRIPTAN SUCCINATE 50 MG PO TABS
50.0000 mg | ORAL_TABLET | ORAL | Status: DC | PRN
  Administered 2020-01-07: 50 mg via ORAL
  Filled 2020-01-07 (×3): qty 1

## 2020-01-07 MED ORDER — ACETAMINOPHEN 325 MG PO TABS
650.0000 mg | ORAL_TABLET | ORAL | Status: DC | PRN
  Administered 2020-01-07 – 2020-01-08 (×4): 650 mg via ORAL
  Filled 2020-01-07 (×4): qty 2

## 2020-01-07 MED ORDER — ONDANSETRON HCL 4 MG PO TABS: 4.0000 mg | ORAL_TABLET | ORAL | Status: DC | PRN

## 2020-01-07 MED ORDER — SUGAMMADEX SODIUM 200 MG/2ML IV SOLN
INTRAVENOUS | Status: DC | PRN
  Administered 2020-01-07: 175 mg via INTRAVENOUS

## 2020-01-07 MED ORDER — DEXAMETHASONE SODIUM PHOSPHATE 10 MG/ML IJ SOLN
INTRAMUSCULAR | Status: DC | PRN
  Administered 2020-01-07: 10 mg via INTRAVENOUS

## 2020-01-07 MED ORDER — 0.9 % SODIUM CHLORIDE (POUR BTL) OPTIME
TOPICAL | Status: DC | PRN
  Administered 2020-01-07: 1000 mL

## 2020-01-07 MED ORDER — FENTANYL CITRATE (PF) 250 MCG/5ML IJ SOLN
INTRAMUSCULAR | Status: AC
  Filled 2020-01-07: qty 5

## 2020-01-07 MED ORDER — CEFAZOLIN SODIUM-DEXTROSE 2-4 GM/100ML-% IV SOLN
2.0000 g | Freq: Three times a day (TID) | INTRAVENOUS | Status: AC
  Administered 2020-01-07 (×2): 2 g via INTRAVENOUS
  Filled 2020-01-07: qty 100

## 2020-01-07 MED ORDER — CEFAZOLIN SODIUM-DEXTROSE 2-4 GM/100ML-% IV SOLN
INTRAVENOUS | Status: AC
  Filled 2020-01-07: qty 100

## 2020-01-07 MED ORDER — SODIUM CHLORIDE 0.9 % IV SOLN
INTRAVENOUS | Status: DC | PRN
  Administered 2020-01-07: 500 mL

## 2020-01-07 MED ORDER — CHLORHEXIDINE GLUCONATE CLOTH 2 % EX PADS: 6.0000 | MEDICATED_PAD | Freq: Once | CUTANEOUS | Status: DC

## 2020-01-07 MED ORDER — PROPOFOL 10 MG/ML IV BOLUS
INTRAVENOUS | Status: DC | PRN
  Administered 2020-01-07: 200 mg via INTRAVENOUS

## 2020-01-07 MED ORDER — LIDOCAINE-EPINEPHRINE 1 %-1:100000 IJ SOLN
INTRAMUSCULAR | Status: AC
  Filled 2020-01-07: qty 1

## 2020-01-07 MED ORDER — ONDANSETRON HCL 4 MG/2ML IJ SOLN: 4.0000 mg | INTRAMUSCULAR | Status: DC | PRN

## 2020-01-07 MED ORDER — EPHEDRINE SULFATE-NACL 50-0.9 MG/10ML-% IV SOSY
PREFILLED_SYRINGE | INTRAVENOUS | Status: DC | PRN
  Administered 2020-01-07: 10 mg via INTRAVENOUS

## 2020-01-07 MED ORDER — ATORVASTATIN CALCIUM 10 MG PO TABS: 10.0000 mg | ORAL_TABLET | Freq: Every day | ORAL | Status: DC

## 2020-01-07 MED ORDER — LACTATED RINGERS IV SOLN: INTRAVENOUS | Status: DC | PRN

## 2020-01-07 MED ORDER — THROMBIN 5000 UNITS EX SOLR
CUTANEOUS | Status: AC
  Filled 2020-01-07: qty 5000

## 2020-01-07 MED ORDER — ROCURONIUM BROMIDE 50 MG/5ML IV SOSY
PREFILLED_SYRINGE | INTRAVENOUS | Status: DC | PRN
  Administered 2020-01-07: 50 mg via INTRAVENOUS
  Administered 2020-01-07: 30 mg via INTRAVENOUS

## 2020-01-07 MED ORDER — CEFAZOLIN SODIUM-DEXTROSE 2-4 GM/100ML-% IV SOLN
2.0000 g | INTRAVENOUS | Status: AC
  Administered 2020-01-07: 2 g via INTRAVENOUS
  Filled 2020-01-07: qty 100

## 2020-01-07 MED ORDER — ACETAMINOPHEN 650 MG RE SUPP
650.0000 mg | RECTAL | Status: DC | PRN
  Filled 2020-01-07: qty 1

## 2020-01-07 MED ORDER — FENTANYL CITRATE (PF) 250 MCG/5ML IJ SOLN
INTRAMUSCULAR | Status: DC | PRN
  Administered 2020-01-07 (×5): 50 ug via INTRAVENOUS

## 2020-01-07 MED ORDER — HYDROMORPHONE HCL 1 MG/ML IJ SOLN: 0.5000 mg | INTRAMUSCULAR | Status: DC | PRN

## 2020-01-07 MED ORDER — FENTANYL CITRATE (PF) 100 MCG/2ML IJ SOLN
INTRAMUSCULAR | Status: AC
  Filled 2020-01-07: qty 2

## 2020-01-07 MED ORDER — HYDROCODONE-ACETAMINOPHEN 5-325 MG PO TABS
1.0000 | ORAL_TABLET | ORAL | Status: DC | PRN
  Administered 2020-01-07: 13:00:00 1 via ORAL

## 2020-01-07 SURGICAL SUPPLY — 60 items
ADH SKN CLS APL DERMABOND .7 (GAUZE/BANDAGES/DRESSINGS) ×1
BLADE CLIPPER SURG (BLADE) ×4 IMPLANT
BLADE SURG 11 STRL SS (BLADE) ×6 IMPLANT
BOOT SUTURE AID YELLOW STND (SUTURE) ×2 IMPLANT
BUR ACORN 9.0 PRECISION (BURR) ×2 IMPLANT
BUR ACORN 9.0MM PRECISION (BURR) ×1
CANISTER SUCT 3000ML PPV (MISCELLANEOUS) ×3 IMPLANT
CLIP RANEY DISP (INSTRUMENTS) IMPLANT
CLOSURE WOUND 1/2 X4 (GAUZE/BANDAGES/DRESSINGS)
COVER WAND RF STERILE (DRAPES) ×3 IMPLANT
DECANTER SPIKE VIAL GLASS SM (MISCELLANEOUS) ×3 IMPLANT
DERMABOND ADVANCED (GAUZE/BANDAGES/DRESSINGS) ×2
DERMABOND ADVANCED .7 DNX12 (GAUZE/BANDAGES/DRESSINGS) ×1 IMPLANT
DRAPE HALF SHEET 40X57 (DRAPES) ×3 IMPLANT
DRAPE INCISE IOBAN 66X45 STRL (DRAPES) ×3 IMPLANT
DRAPE ORTHO SPLIT 77X108 STRL (DRAPES) ×6
DRAPE SURG ORHT 6 SPLT 77X108 (DRAPES) ×2 IMPLANT
DURAPREP 26ML APPLICATOR (WOUND CARE) ×6 IMPLANT
ELECT REM PT RETURN 9FT ADLT (ELECTROSURGICAL) ×3
ELECTRODE REM PT RTRN 9FT ADLT (ELECTROSURGICAL) ×1 IMPLANT
GAUZE 4X4 16PLY RFD (DISPOSABLE) IMPLANT
GLOVE BIO SURGEON STRL SZ7.5 (GLOVE) ×6 IMPLANT
GLOVE BIOGEL PI IND STRL 7.5 (GLOVE) IMPLANT
GLOVE BIOGEL PI INDICATOR 7.5 (GLOVE) ×4
GLOVE EXAM NITRILE XL STR (GLOVE) IMPLANT
GLOVE SURG SS PI 7.0 STRL IVOR (GLOVE) ×8 IMPLANT
GOWN STRL REUS W/ TWL LRG LVL3 (GOWN DISPOSABLE) ×2 IMPLANT
GOWN STRL REUS W/ TWL XL LVL3 (GOWN DISPOSABLE) IMPLANT
GOWN STRL REUS W/TWL 2XL LVL3 (GOWN DISPOSABLE) IMPLANT
GOWN STRL REUS W/TWL LRG LVL3 (GOWN DISPOSABLE) ×3
GOWN STRL REUS W/TWL XL LVL3 (GOWN DISPOSABLE) ×3
HEMOSTAT POWDER KIT SURGIFOAM (HEMOSTASIS) ×3 IMPLANT
HEMOSTAT SURGICEL 2X14 (HEMOSTASIS) IMPLANT
KIT BASIN OR (CUSTOM PROCEDURE TRAY) ×3 IMPLANT
KIT TURNOVER KIT B (KITS) ×3 IMPLANT
MARKER SKIN DUAL TIP RULER LAB (MISCELLANEOUS) IMPLANT
NEEDLE HYPO 22GX1.5 SAFETY (NEEDLE) ×3 IMPLANT
NS IRRIG 1000ML POUR BTL (IV SOLUTION) ×3 IMPLANT
PACK LAMINECTOMY NEURO (CUSTOM PROCEDURE TRAY) ×3 IMPLANT
PAD ARMBOARD 7.5X6 YLW CONV (MISCELLANEOUS) ×9 IMPLANT
PASSER CATH 65CM DISP (NEUROSURGERY SUPPLIES) ×1 IMPLANT
SHEATH PERITONEAL INTRO 61 (MISCELLANEOUS) ×1 IMPLANT
SPONGE LAP 4X18 RFD (DISPOSABLE) IMPLANT
STAPLER SKIN PROX WIDE 3.9 (STAPLE) ×3 IMPLANT
STRIP CLOSURE SKIN 1/2X4 (GAUZE/BANDAGES/DRESSINGS) IMPLANT
SUT ETHILON 3 0 FSL (SUTURE) IMPLANT
SUT MON AB 3-0 SH 27 (SUTURE) ×3
SUT MON AB 3-0 SH27 (SUTURE) IMPLANT
SUT NURALON 4 0 TR CR/8 (SUTURE) IMPLANT
SUT SILK 0 TIES 10X30 (SUTURE) IMPLANT
SUT SILK 3 0 SH 30 (SUTURE) IMPLANT
SUT VIC AB 2-0 CP2 18 (SUTURE) ×7 IMPLANT
SUT VIC AB 3-0 SH 8-18 (SUTURE) ×4 IMPLANT
TOWEL GREEN STERILE (TOWEL DISPOSABLE) ×6 IMPLANT
TOWEL GREEN STERILE FF (TOWEL DISPOSABLE) ×3 IMPLANT
TUBE CONNECTING 12'X1/4 (SUCTIONS)
TUBE CONNECTING 12X1/4 (SUCTIONS) IMPLANT
UNDERPAD 30X30 (UNDERPADS AND DIAPERS) ×3 IMPLANT
VALVE PROGRAM CERTAS SM ANTI (Valve) ×2 IMPLANT
WATER STERILE IRR 1000ML POUR (IV SOLUTION) ×3 IMPLANT

## 2020-01-07 NOTE — Brief Op Note (Signed)
01/07/2020  9:52 AM  PATIENT:  Andrew Hayes  79 y.o. male  PRE-OPERATIVE DIAGNOSIS:  Normal pressure hydrocephalus  POST-OPERATIVE DIAGNOSIS:  Normal pressure hydrocephalus  PROCEDURE:  Procedure(s): Right ventriculoperitoneal shunt placement (Right)  SURGEON:  Surgeon(s) and Role:    * Judith Part, MD - Primary  PHYSICIAN ASSISTANT:   ASSISTANTS: none   ANESTHESIA:   general  EBL:  100 mL   BLOOD ADMINISTERED:none  DRAINS: none   LOCAL MEDICATIONS USED:  LIDOCAINE   SPECIMEN:  No Specimen  DISPOSITION OF SPECIMEN:  N/A  COUNTS:  YES  TOURNIQUET:  * No tourniquets in log *  DICTATION: .Note written in EPIC  PLAN OF CARE: Admit to inpatient   PATIENT DISPOSITION:  PACU - hemodynamically stable.   Delay start of Pharmacological VTE agent (>24hrs) due to surgical blood loss or risk of bleeding: yes

## 2020-01-07 NOTE — Anesthesia Procedure Notes (Signed)
Procedure Name: Intubation Date/Time: 01/07/2020 7:41 AM Performed by: Griffin Dakin, CRNA Pre-anesthesia Checklist: Patient identified, Emergency Drugs available, Suction available and Patient being monitored Patient Re-evaluated:Patient Re-evaluated prior to induction Oxygen Delivery Method: Circle system utilized Preoxygenation: Pre-oxygenation with 100% oxygen Induction Type: IV induction Ventilation: Mask ventilation without difficulty Laryngoscope Size: Mac and 4 Grade View: Grade I Tube type: Oral Tube size: 7.5 mm Number of attempts: 1 Airway Equipment and Method: Stylet Placement Confirmation: ETT inserted through vocal cords under direct vision,  positive ETCO2 and breath sounds checked- equal and bilateral Secured at: 22 cm Tube secured with: Tape Dental Injury: Teeth and Oropharynx as per pre-operative assessment

## 2020-01-07 NOTE — Op Note (Signed)
PATIENT: Andrew Hayes  DAY OF SURGERY: 01/07/20   PRE-OPERATIVE DIAGNOSIS:  Normal pressure hydrocephalus   POST-OPERATIVE DIAGNOSIS:  Normal pressure hydrocephalus   PROCEDURE:  Right ventriculoperitoneal shunt placement   SURGEON:  Surgeon(s) and Role:    Judith Part, MD - Primary   ANESTHESIA: ETGA   BRIEF HISTORY: This is a 79 year old man who presented with progressive issues with memory and gait, concerning for NPH. He had a LD trial with a delayed, but significant improvement. I therefore recommended shunt placement. This was discussed with the patient as well as risks, benefits, and alternatives and wished to proceed with surgery.   OPERATIVE DETAIL: The patient was taken to the operating room and placed on the OR table in the supine position. A formal time out was performed with two patient identifiers and confirmed the operative site. Anesthesia was induced by the anesthesia team. The operative site was marked, hair was clipped with surgical clippers, the area was then prepped and draped in a sterile fashion.   For the cranial portion, a curvilinear incision was placed in the right frontal area over Kocher's point. A burr hole was placed and a ventricular catheter was placed using standard landmarks with good flow of CSF under mildly increased pressure. This was secured and attention was turned to the abdomen.  In the right subcostal region, a transverse incision was created. The muscle layers were dissected and retracted, and the peritoneum was incised.   Tunneling was then performed from the cranial incision to the abdominal incision with a tunneler with particular attention to staying superficial to the clavicle. The catheter was then connected proximally to a Codman Certas shunt valve. 2-0 silk ties were used at all connection points. Distal flow was confirmed from the distal catheter, at which time it was placed into the abdomen without resistance or coiling.    All instrument and sponge counts were correct, the incisions were copiously irrigated, then closed in layers. The patient was then returned to anesthesia for emergence. No apparent complications at the completion of the procedure.   EBL:  51mL  IMPLANTS: Codman Certas set to performance level 4   DRAINS: none   SPECIMENS: none   Judith Part, MD 01/07/20 7:23 AM

## 2020-01-07 NOTE — Anesthesia Postprocedure Evaluation (Signed)
Anesthesia Post Note  Patient: Andrew Hayes  Procedure(s) Performed: Right ventriculoperitoneal shunt placement (Right Head)     Patient location during evaluation: PACU Anesthesia Type: General Level of consciousness: awake and alert Pain management: pain level controlled Vital Signs Assessment: post-procedure vital signs reviewed and stable Respiratory status: spontaneous breathing, nonlabored ventilation, respiratory function stable and patient connected to nasal cannula oxygen Cardiovascular status: blood pressure returned to baseline and stable Postop Assessment: no apparent nausea or vomiting Anesthetic complications: no    Last Vitals:  Vitals:   01/07/20 1550 01/07/20 1714  BP: 140/72 (!) 153/94  Pulse: 74 81  Resp: (!) 22 20  Temp: 36.9 C 36.5 C  SpO2: 98%     Last Pain:  Vitals:   01/07/20 1714  TempSrc: Oral  PainSc:                  Tiajuana Amass

## 2020-01-07 NOTE — H&P (Signed)
Surgical H&P Update  HPI: 79 y.o. man with symptoms concerning for NPH that had a delayed, but significant, improvement with lumbar drainage, now here for VPS placement. Still having symptoms and wishes to proceed with surgery.  PMHx:  Past Medical History:  Diagnosis Date  . Actinic keratoses   . Arthritis   . Diverticulosis    Mild  . Elevated fasting glucose    Mildly  . Herniated disc   . Junctional nevus   . Lipidemia    Mixed  . Lyme disease 2020   pt placed on abx and stated he stopped taking vitamins so as not to interfere.  . Mild sleep apnea    CPAP  . Organic impotence   . Plantar fasciitis   . Pneumonia 1966  . Prostate cancer ([place])   . Vestibular migraine    FamHx:  Family History  Problem Relation Age of Onset  . Stroke Mother   . Hypertension Mother   . Parkinson's disease Mother   . COPD Father   . Dementia Brother   . Hydrocephalus Brother   . Parkinson's disease Brother   . Parkinson's disease Sister   . Colon cancer Neg Hx   . Migraines Neg Hx        none to his knowledge   SocHx:  reports that he quit smoking about 35 years ago. His smoking use included cigarettes. He has a 48.00 pack-year smoking history. He has never used smokeless tobacco. He reports current alcohol use of about 7.0 standard drinks of alcohol per week. He reports that he does not use drugs.  Physical Exam: AOx3, PERRL, FS, TM  Strength 5/5 x4, SILTx4  Assesment/Plan: 79 y.o. man with h/o NPH, here for VPS placement. Risks, benefits, and alternatives discussed and the patient would like to continue with surgery.  -OR today -4NP post-op  Judith Part, MD 01/07/20 7:22 AM

## 2020-01-07 NOTE — Transfer of Care (Signed)
Immediate Anesthesia Transfer of Care Note  Patient: Andrew Hayes  Procedure(s) Performed: Right ventriculoperitoneal shunt placement (Right Head)  Patient Location: PACU  Anesthesia Type:General  Level of Consciousness: awake, alert  and oriented  Airway & Oxygen Therapy: Patient Spontanous Breathing  Post-op Assessment: Report given to RN and Post -op Vital signs reviewed and stable  Post vital signs: Reviewed and stable  Last Vitals:  Vitals Value Taken Time  BP 161/99 01/07/20 0938  Temp    Pulse 68 01/07/20 0941  Resp 27 01/07/20 0941  SpO2 99 % 01/07/20 0941  Vitals shown include unvalidated device data.  Last Pain:  Vitals:   01/07/20 0604  TempSrc:   PainSc: 0-No pain         Complications: No apparent anesthesia complications

## 2020-01-08 ENCOUNTER — Encounter: Payer: Self-pay | Admitting: *Deleted

## 2020-01-08 MED ORDER — HYDROCODONE-ACETAMINOPHEN 5-325 MG PO TABS: 1.0000 | ORAL_TABLET | ORAL | 0 refills | Status: DC | PRN

## 2020-01-08 NOTE — Progress Notes (Signed)
PT and OT to eval pt prior to D/C

## 2020-01-08 NOTE — Progress Notes (Signed)
Neurosurgery Service Progress Note  Subjective: No acute events overnight, no new complaints   Objective: Vitals:   01/07/20 1714 01/07/20 1953 01/07/20 2353 01/08/20 0340  BP: (!) 153/94 (!) 142/64 119/64 (!) 128/59  Pulse: 81 76 66 (!) 55  Resp: 20     Temp: 97.7 F (36.5 C) 98.1 F (36.7 C) 98.3 F (36.8 C) 97.6 F (36.4 C)  TempSrc: Oral  Oral Oral  SpO2:      Weight:      Height:       Temp (24hrs), Avg:98 F (36.7 C), Min:97.3 F (36.3 C), Max:98.4 F (36.9 C)  CBC Latest Ref Rng & Units 01/07/2020 01/03/2020 09/17/2019  WBC 4.0 - 10.5 K/uL 10.5 5.4 6.5  Hemoglobin 13.0 - 17.0 g/dL 14.8 15.3 14.7  Hematocrit 39.0 - 52.0 % 43.6 46.8 44.0  Platelets 150 - 400 K/uL 220 210 212   BMP Latest Ref Rng & Units 01/07/2020 01/03/2020 09/17/2019  Glucose 70 - 99 mg/dL - 70 -  BUN 8 - 23 mg/dL - 14 -  Creatinine 0.61 - 1.24 mg/dL 1.17 1.13 1.10  BUN/Creat Ratio 10 - 24 - - -  Sodium 135 - 145 mmol/L - 143 -  Potassium 3.5 - 5.1 mmol/L - 3.8 -  Chloride 98 - 111 mmol/L - 109 -  CO2 22 - 32 mmol/L - 26 -  Calcium 8.9 - 10.3 mg/dL - 9.6 -    Intake/Output Summary (Last 24 hours) at 01/08/2020 0750 Last data filed at 01/07/2020 1635 Gross per 24 hour  Intake 1820 ml  Output 100 ml  Net 1720 ml    Current Facility-Administered Medications:  .  acetaminophen (TYLENOL) tablet 650 mg, 650 mg, Oral, Q4H PRN, 650 mg at 01/07/20 2300 **OR** acetaminophen (TYLENOL) suppository 650 mg, 650 mg, Rectal, Q4H PRN, Judith Part, MD .  atorvastatin (LIPITOR) tablet 10 mg, 10 mg, Oral, Daily, Daquawn Seelman A, MD .  docusate sodium (COLACE) capsule 100 mg, 100 mg, Oral, BID, Judith Part, MD, 100 mg at 01/07/20 2300 .  famotidine (PEPCID) IVPB 20 mg premix, 20 mg, Intravenous, Q12H, Kaylene Dawn A, MD, Last Rate: 100 mL/hr at 01/07/20 2304, 20 mg at 01/07/20 2304 .  [START ON 01/09/2020] heparin injection 5,000 Units, 5,000 Units, Subcutaneous, Q8H, Hailei Besser A,  MD .  meclizine (ANTIVERT) tablet 25 mg, 25 mg, Oral, Q6H PRN, Judith Part, MD .  ondansetron (ZOFRAN) tablet 4 mg, 4 mg, Oral, Q4H PRN **OR** ondansetron (ZOFRAN) injection 4 mg, 4 mg, Intravenous, Q4H PRN, Barnabas Henriques A, MD .  polyethylene glycol (MIRALAX / GLYCOLAX) packet 17 g, 17 g, Oral, Daily PRN, Judith Part, MD .  promethazine (PHENERGAN) tablet 12.5-25 mg, 12.5-25 mg, Oral, Q4H PRN, Judith Part, MD .  SUMAtriptan (IMITREX) tablet 50 mg, 50 mg, Oral, Q2H PRN, Judith Part, MD, 50 mg at 01/07/20 2300   Physical Exam: AOx3, PERRL, EOMI, FS, Strength 5/5 x4, SILTx4, no drift  Assessment & Plan: 79 y.o. man s/p VPS for NPH, recovering well.  -plain films and CTH show hardware in good position -discharge home today  Judith Part  01/08/20 7:50 AM

## 2020-01-08 NOTE — Evaluation (Addendum)
Occupational Therapy Evaluation Patient Details Name: Andrew Hayes MRN: [id number] DOB: 11-20-1940 Today's Date: 01/08/2020    History of Present Illness 79 yo male presenting with progressive issues with memory and gait.  s/p R ventriculoperitoneal shunt placement on 01/07/20. PMH including arthritis, prostate cancer, sleep apnea (using CPAP), Herniated disc, and lyme disease.    Clinical Impression   PTA, pt was living with his wife and was independent; enjoys fly fishing and golf. Currently, pt performing ADLs and functional mobility at Mod I - Independent level. Pt demonstrating WFL for balance during LB dressing, mobility in hallway, and stairs navigation. Pt also presenting [facility] for cognition. Provided education on compensatory techniques for grooming and LB ADLs to present forward bending and decrease any potential headaches or pain at surgery site. Answered all pt questions. Recommend dc home once medically stable per physician. All acute OT needs met and will sign off. Thank you.    Follow Up Recommendations  No OT follow up    Equipment Recommendations  None recommended by OT    Recommendations for Other Services       Precautions / Restrictions Precautions Precautions: None Restrictions Weight Bearing Restrictions: No      Mobility Bed Mobility Overal bed mobility: Independent                Transfers Overall transfer level: Independent                    Balance Overall balance assessment: Independent                                         ADL either performed or assessed with clinical judgement   ADL Overall ADL's : Modified independent                                       General ADL Comments: Pt performing ADLs and functional mobility at Mod I level near baseline with slight increase in time or use of compensatory techniques. Pt donning clothes, performing functional mobility in hallway, stairs, and  simulated tub transfer. Pt demonstrating understanding as well as good body awareness to optimzie safety. Educating pt on techniques to decrease forward head movement and decrease potential pain.      Vision Baseline Vision/History: Wears glasses Wears Glasses: At all times Patient Visual Report: No change from baseline       Perception     Praxis      Pertinent Vitals/Pain Pain Assessment: No/denies pain     Hand Dominance Right   Extremity/Trunk Assessment Upper Extremity Assessment Upper Extremity Assessment: Overall WFL for tasks assessed   Lower Extremity Assessment Lower Extremity Assessment: Overall WFL for tasks assessed   Cervical / Trunk Assessment Cervical / Trunk Assessment: Normal   Communication Communication Communication: No difficulties   Cognition Arousal/Alertness: Awake/alert Behavior During Therapy: WFL for tasks assessed/performed Overall Cognitive Status: Within Functional Limits for tasks assessed                                 General Comments: Pt demonstrating WFL for ADLs and simple executive functioning tasks. Pt ordering his lunch demonstrating good planning skills, visual scanning, and decision making. Pt performing trail making task to  return to his room demonstrating good attention, problem solving, and awareness.   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, drives        OT Problem List: Impaired balance (sitting and/or standing);Decreased strength;Decreased knowledge of precautions      OT Treatment/Interventions:      OT Goals(Current goals can be found in the care plan section) Acute Rehab OT Goals Patient Stated Goal: Go home today OT Goal Formulation: All assessment and education complete, DC therapy  OT Frequency:     Barriers to D/C:            Co-evaluation              AM-PAC OT "6 Clicks" Daily Activity     Outcome Measure Help from another person eating meals?: None Help from another person taking care of personal grooming?: None Help from another person toileting, which includes using toliet, bedpan, or urinal?: None Help from another person bathing (including washing, rinsing, drying)?: None Help from another person to put on and taking off regular upper body clothing?: None Help from another person to put on and taking off regular lower body clothing?: None 6 Click Score: 24   End of Session Nurse Communication: Mobility status  Activity Tolerance: Patient tolerated treatment well Patient left: in bed;with call bell/phone within reach  OT Visit Diagnosis: Muscle weakness (generalized) (M62.81)                Time: 1610-9604 OT Time Calculation (min): 20 min Charges:  OT General Charges $OT Visit: 1 Visit OT Evaluation $OT Eval Low Complexity: 1 Low  Charis Capehart MSOT, OTR/L Acute Rehab Pager: [phone] Office: [place] 01/08/2020, 10:41 AM

## 2020-01-08 NOTE — Discharge Summary (Signed)
Discharge Summary  Date of Admission: 01/07/2020  Date of Discharge: 01/08/20  Attending Physician: Emelda Brothers, MD  Hospital Course: Patient was admitted following an uncomplicated right VPS for NPH. He was recovered in PACU and transferred to 4NP. Post-op CTH and shunt series showed hardware in good position. His hospital course was uncomplicated and the patient was discharged home on 01/08/20. He will follow up in clinic with me in 2 weeks.  Neurologic exam at discharge:  AOx3, PERRL, EOMI, FS, TM Strength 5/5 x4, SILTx4, no drift  Discharge diagnosis: Normal pressure hydrocephalus  Judith Part, MD 01/08/20 7:52 AM

## 2020-01-08 NOTE — Progress Notes (Addendum)
PT and OT to eval prior to discharge.

## 2020-01-08 NOTE — Progress Notes (Signed)
Discharge instructions reviewed with patient, IV removed.  Pt to be discharged home with wife.

## 2020-01-08 NOTE — Discharge Instructions (Signed)
Discharge Instructions ° °No restriction in activities, slowly increase your activity back to normal.  ° °Your incision is closed with dermabond (purple glue). This will naturally fall off over the next 1-2 weeks.  ° °Okay to shower on the day of discharge. Use regular soap and water and try to be gentle when cleaning your incision.  ° °Follow up with Dr. Aarianna Hoadley in 2 weeks after discharge. If you do not already have a discharge appointment, please call his office at [phone] to schedule a follow up appointment. If you have any concerns or questions, please call the office and let us know. °

## 2020-01-09 ENCOUNTER — Ambulatory Visit: Payer: PPO | Admitting: Adult Health

## 2020-01-15 ENCOUNTER — Telehealth: Payer: Self-pay | Admitting: Neurology

## 2020-01-15 ENCOUNTER — Ambulatory Visit: Payer: PPO | Admitting: Adult Health

## 2020-01-15 NOTE — Telephone Encounter (Signed)
Patient called wanting to verify if he is needing to setup an infusion apt

## 2020-01-15 NOTE — Telephone Encounter (Signed)
I called the pt back. He had a shunt placed last Monday. He developed a headache/migraine and vertigo. He stated Dr. [name] adjusted the shunt yesterday. Today he feels a little better but still having some lingering symptoms and is wondering if he should get an infusion. He stated Dr. [name] had spoken with Dr. [name] recently about this. Pt will await a call back. He verbalized appreciation.

## 2020-01-15 NOTE — Telephone Encounter (Signed)
Spoke with pt. NPs do not have any openings for tomorrow currently. Dr. [name] aware. Will offer pt appt with her tomorrow and cancel Monday 4/12's appointment. Spoke with pt and he has agreed. Pt scheduled tomorrow 4/7 @ 12:00 pm. I asked pt to arrive around 11:45 AM.

## 2020-01-15 NOTE — Progress Notes (Signed)
[facility]    Provider:  Dr [name] Requesting Provider: [name], MD Primary Care Provider:  [name], MD  CC:  Vestibular migraine and hydrocephalus normal pressure status post shunting  Interval history 01/16/2020: He had the shunting but he developed a headache. The headache is more on the right side behind the eye socket and is throbbing but just an ache, just irritating now but it did get to 6-7/10. He started using his cpap again. He feels improved with his vertigo and headache. We can try nurtec and a nerve block, will not send to infusion as he looks completely comfortable. He is having more of his vestibular episodes, we discussed trying acute management medications and if it gets worse or more frequent trying a preventative. He used to go once a year or every 6 months, now its more monthly but not as severe. So we will try nurtec. He will follow up with Dr. [name] to change the shunt settings.   Performed by Dr. [name] M.D. All procedures a documented blood were medically necessary, reasonable and appropriate based on the patient's history, medical diagnosis and physician opinion. Verbal informed consent was obtained from the patient, patient was informed of potential risk of procedure, including bruising, bleeding, hematoma formation, infection, muscle weakness, muscle pain, numbness, transient hypertension, transient hyperglycemia and transient insomnia among others. All areas injected were topically clean with isopropyl rubbing alcohol. Nonsterile nonlatex gloves were worn during the procedure.  1. Greater occipital nerve block [id number]). The greater occipital nerve site was identified at the nuchal line medial to the occipital artery. Medication was injected into the  right occipital nerve areas and suboccipital areas. Patient's condition is associated with inflammation of the greater occipital nerve and associated multiple groups. Injection was deemed  medically necessary, reasonable and appropriate. Injection represents a separate and unique surgical service.  2. Supraorbital nerve block (64400): Supraorbital nerve site was identified along the incision of the frontal bone on the orbital/supraorbital ridge. Medication was injected into the right supraorbital nerve areas. Patient's condition is associated with inflammation of the supraorbital and associated muscle groups. Injection was deemed medically necessary, reasonable and appropriate. Injection represents a separate and unique surgical service.   Interval history January 15, 2020: Patient has undergone lumbar drain the last several days, he was discharged from the hospital just 2 days ago, I did discuss his case with Dr. [name] who stated he had a headache in the hospital and adjusted his drain, patients here for headache.   Interval history 10/16/2019: Patient for follow up after lumbar drain placement. Patient feels as though he improved as far as his balance, he definitely improved. It was not "drastic" but it was signigifant. I reminded him we are catching this early and so his improvement may not be as drastic as someone we diagnose in the later stages of NPH. Also discuss his episodes of dizziness may also be due to possible NPH ve vestibular migraines. We discussed this at length and he is going to see Dr [name].  Interval history 08/15/2019: Patient with episodes of dizziness unclear etiology. Possibly central associated with migraines. He has had 2 episodes and treated with migraine IV infusion of Depacon. We discussed migraine preventative, he says meats with nitrates cause a lot of problems and triggers episodes, He doesn't have a problem with bacon but feels lunch meat affects him particularly salami. He does not have these often. 3 years ago he was having them every few months, now he has  had a few more. He declines preventative, he will try to take it sooner right at the onset he  will take rizatriptan. We also discussed his MRI today, reviewed images, he has some memory problems, he feels his balance is poor. His brother has NPH. He feels his memory is impaired. Reviewed MRI images with him, showed him very large ventricles.  Interval history: Patient started experiencing vestibular symptoms several days ago. 2 days ago it put him in bed. Unclear if vestibular migraines or other etiology. Discussed we should take him to the IV infusion lab and treat him like a migraine and if symptoms resolve then this is a likely indication this is central in origin and not due to canaliths for example, I suspect vestibular migraines, discussed with patient and agreed on treatment. In the furute he will call then symptoms starts for 1g depacon, today his symptoms resolved afte rtreatment which included a fogginess, dizzines, not feeling right in the head, imbalance.  HPI:  Jamual Bass is a 79 y.o. male here as requested by [name], MD for dizziness. He was 79 years old with the first episode of vertigo, he has to lay down, a feeling of spinning and if he doesn't lay down perfectly flat keep his head on the bed he gets nauseated. Worst one was 4 years ago he was in bed for days and every time he got up he vomited. He has been evaluated by [facility] and multiple ENTs and diagnosed with vestibular migraines. He will episodes are a little dizziness, he takes rizatriptan and it does get very bad after that, but he has a "hangover" and may not feel good for a few days but he can avoid the severe vertigo and vomiting. He is getting these "little" episodes more frequently 2x a month that last for 4-5 days. No inciting events, he has evaluated all possible triggers and none found. He even tried a migraine diet for 3-4 months recommended by [facility] and didn;t help. He never gets headaches with the episodes. He has been to vestibular therapy. He drinks enough water, he has had brain MRIs, He is having more  frequent episodes, he feels his balance is impaired and sometimes he does not walk well, hearing changes. No Fhx of migraines.  Reviewed notes, labs and imaging from outside physicians, which showed:  IMPRESSION: MRI brain 2016 personally reviewed images 1. No imaging explanation for vestibular cochlear symptoms, as above. 2. Ventriculomegaly. Are there any symptoms of normal pressure hydrocephalus  Review of Systems: Patient complains of symptoms per HPI as well as the following symptoms: headache, incision pain. Pertinent negatives and positives per HPI. All others negative   Social History   Socioeconomic History  . Marital status: Married    Spouse name: Hassan Rowan  . Number of children: 2  . Years of education: 56  . Highest education level: Not on file  Occupational History  . Occupation: cpa    Employer: RETIRED    Comment: retired  Tobacco Use  . Smoking status: Former Smoker    Packs/day: 2.00    Years: 24.00    Pack years: 48.00    Types: Cigarettes    Quit date: 10/11/1984    Years since quitting: 35.2  . Smokeless tobacco: Never Used  . Tobacco comment: briefly used smokeless tobacco  Substance and Sexual Activity  . Alcohol use: Yes    Alcohol/week: 7.0 standard drinks    Types: 7 Standard drinks or equivalent per week  Comment: 7 beers weekly  . Drug use: No  . Sexual activity: Not on file  Other Topics Concern  . Not on file  Social History Narrative   Patient lives at home with family.   Patient consumes about 2 cups of caffeine daily,   right handed.   2 children (1 deceased)   Social Determinants of Health   Financial Resource Strain:   . Difficulty of Paying Living Expenses:   Food Insecurity:   . Worried About Charity fundraiser in the Last Year:   . Arboriculturist in the Last Year:   Transportation Needs:   . Film/video editor (Medical):   Marland Kitchen Lack of Transportation (Non-Medical):   Physical Activity:   . Days of Exercise per Week:     . Minutes of Exercise per Session:   Stress:   . Feeling of Stress :   Social Connections:   . Frequency of Communication with Friends and Family:   . Frequency of Social Gatherings with Friends and Family:   . Attends Religious Services:   . Active Member of Clubs or Organizations:   . Attends Archivist Meetings:   Marland Kitchen Marital Status:   Intimate Partner Violence:   . Fear of Current or Ex-Partner:   . Emotionally Abused:   Marland Kitchen Physically Abused:   . Sexually Abused:     Family History  Problem Relation Age of Onset  . Stroke Mother   . Hypertension Mother   . Parkinson's disease Mother   . COPD Father   . Dementia Brother   . Hydrocephalus Brother   . Parkinson's disease Brother   . Parkinson's disease Sister   . Colon cancer Neg Hx   . Migraines Neg Hx        none to his knowledge    Past Medical History:  Diagnosis Date  . Actinic keratoses   . Arthritis   . Diverticulosis    Mild  . Elevated fasting glucose    Mildly  . Herniated disc   . Junctional nevus   . Lipidemia    Mixed  . Lyme disease 2020   pt placed on abx and stated he stopped taking vitamins so as not to interfere.  . Mild sleep apnea    CPAP  . Organic impotence   . Plantar fasciitis   . Pneumonia 1966  . Prostate cancer ([place])   . Vestibular migraine     Patient Active Problem List   Diagnosis Date Noted  . NPH (normal pressure hydrocephalus) ([place]) 01/07/2020  . Normal pressure hydrocephalus ([place]) 09/17/2019  . Dizziness 08/15/2019  . Migraine without aura and without status migrainosus, not intractable 08/15/2019  . Vestibular migraine 06/25/2019  . Fever and chills 05/28/2019  . Impingement syndrome of left shoulder 08/10/2017  . Chronic left shoulder pain 07/21/2017  . Dysphagia 06/09/2015  . History of colonic polyps 06/09/2015  . Dizzy spells 03/08/2012  . Hyperlipidemia 03/08/2012    Past Surgical History:  Procedure Laterality Date  . APPENDECTOMY    .  COLONOSCOPY    . ELBOW SURGERY  2013  . KNEE ARTHROSCOPY  2013   knee and elbow  . PLACEMENT OF LUMBAR DRAIN N/A 09/17/2019   Procedure: PLACEMENT OF LUMBAR DRAIN;  Surgeon: Judith Part, MD;  Location: [facility];  Service: Neurosurgery;  Laterality: N/A;  Lumbar drain placement  . PROSTATECTOMY  1986  . TONSILLECTOMY    . TONSILLECTOMY    . VENTRICULOPERITONEAL  SHUNT Right 01/07/2020   Procedure: Right ventriculoperitoneal shunt placement;  Surgeon: Judith Part, MD;  Location: [facility];  Service: Neurosurgery;  Laterality: Right;    Current Outpatient Medications  Medication Sig Dispense Refill  . atorvastatin (LIPITOR) 10 MG tablet Take 10 mg by mouth daily.     . Cholecalciferol (VITAMIN D) 50 MCG (2000 UT) CAPS Take 4,000 Units by mouth daily.     . Coenzyme Q10 200 MG capsule Take 200 mg by mouth daily.     Marland Kitchen HYDROcodone-acetaminophen (NORCO/VICODIN) 5-325 MG tablet Take 1 tablet by mouth every 4 (four) hours as needed (pain). 20 tablet 0  . meclizine (ANTIVERT) 25 MG tablet Take 1 tablet (25 mg total) by mouth every 6 (six) hours as needed for dizziness or nausea. 30 tablet 6  . Multiple Vitamin (MULTI-VITAMIN PO) Take 1 tablet by mouth daily.     . Omega-3 Fatty Acids (FISH OIL PO) Take 2,000 mg by mouth daily.     . Rimegepant Sulfate (NURTEC) 75 MG TBDP Take 75 mg by mouth daily as needed. For migraines. Take as close to onset of migraine as possible. One daily maximum. 10 tablet 0  . rizatriptan (MAXALT-MLT) 10 MG disintegrating tablet Take 1 tablet (10 mg total) by mouth as needed for migraine. May repeat in 2 hours if needed 10 tablet 11   No current facility-administered medications for this visit.    Allergies as of 01/16/2020 - Review Complete 01/16/2020  Allergen Reaction Noted  . Percocet [oxycodone-acetaminophen] Other (See Comments) 05/23/2015    Vitals: BP (!) 172/106 (BP Location: Left Arm, Patient Position: Sitting)   Pulse 74   Temp 97.7 F (36.5 C)  Comment: taken at front Last Weight:  Wt Readings from Last 1 Encounters:  01/07/20 185 lb (83.9 kg)   Last Height:   Ht Readings from Last 1 Encounters:  01/07/20 5\' 11"  (1.803 m)   Physical exam: Exam: Gen: NAD, conversant, well nourised, obese, well groomed                   Eyes: Conjunctivae clear without exudates or hemorrhage  Neuro: Detailed Neurologic Exam  Speech:    Speech is normal; fluent and spontaneous with normal comprehension.  Cognition:    The patient is oriented to person, place, and time;     recent and remote memory intact;     language fluent;     normal attention, concentration,     fund of knowledge Cranial Nerves:    The pupils are equal, round, and reactive to light. Visual fields are full. Extraocular movements are intact. Trigeminal sensation is intact and the muscles of mastication are normal. The face is symmetric. Marland Kitchen Hearing impaired. Voice is normal. Shoulder shrug is normal. The tongue has normal motion without fasciculations.   Coordination:    No dysmetria.   Gait:    Normal native gait  Motor Observation:    No asymmetry, no atrophy, and no involuntary movements noted. Tone:    Normal muscle tone.    Posture:    Posture is normal. normal erect    Strength:    Strength is V/V in the upper and lower limbs.      Sensation: intact to LT     Reflex Exam:   Assessment/Plan:  Patients with episodes of dizziness and vertigo. He has been extensively evaluated by ENT and possibly vestibular migraines however does not fit completely with this diagnosis (never had headache with the dizziness  which is often present with at least some of the dizzy episodes). Imaging ruled out strokes, schwannomas, vascular etiologies, carotid stenosis. May be vestibular migraines. Also s/p shunting for NPH, reassured patient may take time to heal.  - MRI shows large ventricles: s/p shunting. He feels strange with gait, he had a thorough workup for neuropathy  which was negative including emg/ncs.   - rizatriptan acutely, also he felt the IV infusion for migraines helped when he had his dizziness we can always do that again if needed. Today tried nurtec and if episodes becomemore frequent we can discuss preventative  - I contacted Dr. [name] about this, patient will be seeing him soon for shunt changes   Cc: Little, Lennette Bihari, MD  Sarina Ill, MD  [facility] [address] [place], [place] [address]  I spent 25 minutes of face-to-face and non-face-to-face time with patient on the  1. Migraine with aura and without status migrainosus, not intractable    diagnosis.  This included previsit chart review, lab review, study review, order entry, electronic health record documentation, patient education on the different diagnostic and therapeutic options, counseling and coordination of care, risks and benefits of management, compliance, or risk factor reduction. This does not include time spent on injections.

## 2020-01-15 NOTE — Telephone Encounter (Signed)
Explain the new office procedure to patient, he has to be seen first. If Amy or megan or myself have an open appointment please place him there. I did tell Dr. [name] we would get him in this week so I can always add something tomorrow at noon if megan or amy don;t have anything.

## 2020-01-16 ENCOUNTER — Other Ambulatory Visit: Payer: Self-pay

## 2020-01-16 ENCOUNTER — Ambulatory Visit: Payer: PPO | Admitting: Neurology

## 2020-01-16 VITALS — BP 172/106 | HR 74 | Temp 97.7°F

## 2020-01-16 DIAGNOSIS — G43109 Migraine with aura, not intractable, without status migrainosus: Secondary | ICD-10-CM | POA: Diagnosis not present

## 2020-01-16 MED ORDER — NURTEC 75 MG PO TBDP: 75.0000 mg | ORAL_TABLET | Freq: Every day | ORAL | 0 refills | Status: DC | PRN

## 2020-01-16 NOTE — Patient Instructions (Signed)
   Rimegepant oral dissolving tablet What is this medicine? RIMEGEPANT (ri ME je pant) is used to treat migraine headaches with or without aura. An aura is a strange feeling or visual disturbance that warns you of an attack. It is not used to prevent migraines. This medicine may be used for other purposes; ask your health care provider or pharmacist if you have questions. COMMON BRAND NAME(S): NURTEC ODT What should I tell my health care provider before I take this medicine? They need to know if you have any of these conditions:  kidney disease  liver disease  an unusual or allergic reaction to rimegepant, other medicines, foods, dyes, or preservatives  pregnant or trying to get pregnant  breast-feeding How should I use this medicine? Take the medicine by mouth. Follow the directions on the prescription label. Leave the tablet in the sealed blister pack until you are ready to take it. With dry hands, open the blister and gently remove the tablet. If the tablet breaks or crumbles, throw it away and take a new tablet out of the blister pack. Place the tablet in the mouth and allow it to dissolve, and then swallow. Do not cut, crush, or chew this medicine. You do not need water to take this medicine. Talk to your pediatrician about the use of this medicine in children. Special care may be needed. Overdosage: If you think you have taken too much of this medicine contact a poison control center or emergency room at once. NOTE: This medicine is only for you. Do not share this medicine with others. What if I miss a dose? This does not apply. This medicine is not for regular use. What may interact with this medicine? This medicine may interact with the following medications:  certain medicines for fungal infections like fluconazole, itraconazole  rifampin This list may not describe all possible interactions. Give your health care provider a list of all the medicines, herbs, non-prescription  drugs, or dietary supplements you use. Also tell them if you smoke, drink alcohol, or use illegal drugs. Some items may interact with your medicine. What should I watch for while using this medicine? Visit your health care professional for regular checks on your progress. Tell your health care professional if your symptoms do not start to get better or if they get worse. What side effects may I notice from receiving this medicine? Side effects that you should report to your doctor or health care professional as soon as possible:  allergic reactions like skin rash, itching or hives; swelling of the face, lips, or tongue Side effects that usually do not require medical attention (report these to your doctor or health care professional if they continue or are bothersome):  nausea This list may not describe all possible side effects. Call your doctor for medical advice about side effects. You may report side effects to FDA at 1-800-FDA-1088. Where should I keep my medicine? Keep out of the reach of children. Store at room temperature between 15 and 30 degrees C (59 and 86 degrees F). Throw away any unused medicine after the expiration date. NOTE: This sheet is a summary. It may not cover all possible information. If you have questions about this medicine, talk to your doctor, pharmacist, or health care provider.  2020 Elsevier/Gold Standard (2018-12-11 00:21:31)

## 2020-01-17 ENCOUNTER — Telehealth: Payer: Self-pay | Admitting: Neurology

## 2020-01-17 NOTE — Telephone Encounter (Signed)
Pt called wanting to discuss a treatment report from yesterday with RN. Pt would not give anymore information. Please advise.

## 2020-01-17 NOTE — Telephone Encounter (Signed)
I returned the pt's call. He doesn't feel the "treatments" worked as good as hoped. He said he would send the update to Dr [name] through his mychart.

## 2020-01-21 ENCOUNTER — Ambulatory Visit: Payer: Self-pay | Admitting: Neurology

## 2020-01-22 ENCOUNTER — Other Ambulatory Visit: Payer: Self-pay

## 2020-01-22 ENCOUNTER — Ambulatory Visit: Payer: PPO | Admitting: Adult Health

## 2020-01-22 ENCOUNTER — Encounter: Payer: Self-pay | Admitting: Adult Health

## 2020-01-22 VITALS — BP 164/76 | HR 70 | Temp 98.4°F | Ht 71.0 in | Wt 184.0 lb

## 2020-01-22 DIAGNOSIS — Z9989 Dependence on other enabling machines and devices: Secondary | ICD-10-CM

## 2020-01-22 DIAGNOSIS — G4733 Obstructive sleep apnea (adult) (pediatric): Secondary | ICD-10-CM

## 2020-01-22 NOTE — Progress Notes (Addendum)
PATIENT: Andrew Hayes DOB: 08/21/1941  REASON FOR VISIT: follow up HISTORY FROM: patient  HISTORY OF PRESENT ILLNESS: Today 01/22/20:  Andrew Hayes is a 79 year old male with a history of obstructive sleep apnea on CPAP.  He returns today for follow-up.  His download indicates that he uses machine 29 out of 30 days for compliance of 97%.  He uses machine greater than 4 hours 20 days for compliance of 93%.  On average he uses his machine 6 hours and 21 minutes.  His residual AHI is 7.2 on 8 cm of water with EPR of 1.  Leak in the 95th percentile is 20.2 L/min.  He reports that he is a mouth breather but reports that his chinstrap works well for him.  He states in the last 2 weeks he has been waking up after 3 to 4 hours of sleep.  He recently had a VP shunt placed.  HISTORY I reviewed his CPAP compliance data from 12/03/2018 through 01/01/2019 which is a total of 30 days, during which time he used his machine every night with percent used days greater than 4 hours at 100%, indicating superb compliance with an average usage of 6 hours and 28 minutes, residual AHI borderline at 7.4 per hour, leak on the higher end with the 95th percentile at 23.1 L/m on a pressure of 8 cm.  REVIEW OF SYSTEMS: Out of a complete 14 system review of symptoms, the patient complains only of the following symptoms, and all other reviewed systems are negative.  See HPI  ALLERGIES: Allergies  Allergen Reactions  . Percocet [Oxycodone-Acetaminophen] Other (See Comments)    Patient states he is unable to tolerate    HOME MEDICATIONS: Outpatient Medications Prior to Visit  Medication Sig Dispense Refill  . atorvastatin (LIPITOR) 10 MG tablet Take 10 mg by mouth daily.     . Cholecalciferol (VITAMIN D) 50 MCG (2000 UT) CAPS Take 4,000 Units by mouth daily.     . Coenzyme Q10 200 MG capsule Take 200 mg by mouth daily.     Marland Kitchen HYDROcodone-acetaminophen (NORCO/VICODIN) 5-325 MG tablet Take 1 tablet by mouth  every 4 (four) hours as needed (pain). 20 tablet 0  . meclizine (ANTIVERT) 25 MG tablet Take 1 tablet (25 mg total) by mouth every 6 (six) hours as needed for dizziness or nausea. 30 tablet 6  . Multiple Vitamin (MULTI-VITAMIN PO) Take 1 tablet by mouth daily.     . Omega-3 Fatty Acids (FISH OIL PO) Take 2,000 mg by mouth daily.     . Rimegepant Sulfate (NURTEC) 75 MG TBDP Take 75 mg by mouth daily as needed. For migraines. Take as close to onset of migraine as possible. One daily maximum. 10 tablet 0  . rizatriptan (MAXALT-MLT) 10 MG disintegrating tablet Take 1 tablet (10 mg total) by mouth as needed for migraine. May repeat in 2 hours if needed 10 tablet 11   No facility-administered medications prior to visit.    PAST MEDICAL HISTORY: Past Medical History:  Diagnosis Date  . Actinic keratoses   . Arthritis   . Diverticulosis    Mild  . Elevated fasting glucose    Mildly  . Herniated disc   . Junctional nevus   . Lipidemia    Mixed  . Lyme disease 2020   pt placed on abx and stated he stopped taking vitamins so as not to interfere.  . Mild sleep apnea    CPAP  . Organic impotence   .  Plantar fasciitis   . Pneumonia 1966  . Prostate cancer ([place])   . Vestibular migraine     PAST SURGICAL HISTORY: Past Surgical History:  Procedure Laterality Date  . APPENDECTOMY    . COLONOSCOPY    . ELBOW SURGERY  2013  . KNEE ARTHROSCOPY  2013   knee and elbow  . PLACEMENT OF LUMBAR DRAIN N/A 09/17/2019   Procedure: PLACEMENT OF LUMBAR DRAIN;  Surgeon: Judith Part, MD;  Location: [place];  Service: Neurosurgery;  Laterality: N/A;  Lumbar drain placement  . PROSTATECTOMY  1986  . TONSILLECTOMY    . TONSILLECTOMY    . VENTRICULOPERITONEAL SHUNT Right 01/07/2020   Procedure: Right ventriculoperitoneal shunt placement;  Surgeon: Judith Part, MD;  Location: [place];  Service: Neurosurgery;  Laterality: Right;    FAMILY HISTORY: Family History  Problem Relation Age of Onset    . Stroke Mother   . Hypertension Mother   . Parkinson's disease Mother   . COPD Father   . Dementia Brother   . Hydrocephalus Brother   . Parkinson's disease Brother   . Parkinson's disease Sister   . Colon cancer Neg Hx   . Migraines Neg Hx        none to his knowledge    SOCIAL HISTORY: Social History   Socioeconomic History  . Marital status: Married    Spouse name: Hassan Rowan  . Number of children: 2  . Years of education: 79  . Highest education level: Not on file  Occupational History  . Occupation: cpa    Employer: RETIRED    Comment: retired  Tobacco Use  . Smoking status: Former Smoker    Packs/day: 2.00    Years: 24.00    Pack years: 48.00    Types: Cigarettes    Quit date: 10/11/1984    Years since quitting: 35.3  . Smokeless tobacco: Never Used  . Tobacco comment: briefly used smokeless tobacco  Substance and Sexual Activity  . Alcohol use: Yes    Alcohol/week: 7.0 standard drinks    Types: 7 Standard drinks or equivalent per week    Comment: 7 beers weekly  . Drug use: No  . Sexual activity: Not on file  Other Topics Concern  . Not on file  Social History Narrative   Patient lives at home with family.   Patient consumes about 2 cups of caffeine daily,   right handed.   2 children (1 deceased)   Social Determinants of Health   Financial Resource Strain:   . Difficulty of Paying Living Expenses:   Food Insecurity:   . Worried About Charity fundraiser in the Last Year:   . Arboriculturist in the Last Year:   Transportation Needs:   . Film/video editor (Medical):   Marland Kitchen Lack of Transportation (Non-Medical):   Physical Activity:   . Days of Exercise per Week:   . Minutes of Exercise per Session:   Stress:   . Feeling of Stress :   Social Connections:   . Frequency of Communication with Friends and Family:   . Frequency of Social Gatherings with Friends and Family:   . Attends Religious Services:   . Active Member of Clubs or Organizations:    . Attends Archivist Meetings:   Marland Kitchen Marital Status:   Intimate Partner Violence:   . Fear of Current or Ex-Partner:   . Emotionally Abused:   Marland Kitchen Physically Abused:   . Sexually Abused:  PHYSICAL EXAM  Vitals:   01/22/20 0807  BP: (!) 164/76  Pulse: 70  Temp: 98.4 F (36.9 C)  Weight: 184 lb (83.5 kg)  Height: 5\' 11"  (1.803 m)   Body mass index is 25.66 kg/m.  Generalized: Well developed, in no acute distress  Chest: Lungs clear to auscultation bilaterally  Neurological examination  Mentation: Alert oriented to time, place, history taking. Follows all commands speech and language fluent Cranial nerve II-XII: Extraocular movements were full, visual field were full on confrontational test Head turning and shoulder shrug  were normal and symmetric. Motor: The motor testing reveals 5 over 5 strength of all 4 extremities. Good symmetric motor tone is noted throughout.  Sensory: Sensory testing is intact to soft touch on all 4 extremities. No evidence of extinction is noted.  Gait and station: Gait is normal.    DIAGNOSTIC DATA (LABS, IMAGING, TESTING) - I reviewed patient records, labs, notes, testing and imaging myself where available.  Lab Results  Component Value Date   WBC 10.5 01/07/2020   HGB 14.8 01/07/2020   HCT 43.6 01/07/2020   MCV 95.4 01/07/2020   PLT 220 01/07/2020      Component Value Date/Time   NA 143 01/03/2020 1043   NA 143 05/21/2019 0940   K 3.8 01/03/2020 1043   CL 109 01/03/2020 1043   CO2 26 01/03/2020 1043   GLUCOSE 70 01/03/2020 1043   BUN 14 01/03/2020 1043   BUN 21 05/21/2019 0940   CREATININE 1.17 01/07/2020 1932   CALCIUM 9.6 01/03/2020 1043   GFRNONAA >60 01/07/2020 1932   GFRAA >60 01/07/2020 1932   No results found for: CHOL, HDL, LDLCALC, LDLDIRECT, TRIG, CHOLHDL No results found for: HGBA1C Lab Results  Component Value Date   VITAMINB12 1,197 08/15/2019   Lab Results  Component Value Date   TSH 2.010  08/15/2019      ASSESSMENT AND PLAN 79 y.o. year old male  has a past medical history of Actinic keratoses, Arthritis, Diverticulosis, Elevated fasting glucose, Herniated disc, Junctional nevus, Lipidemia, Lyme disease (2020), Mild sleep apnea, Organic impotence, Plantar fasciitis, Pneumonia (1966), Prostate cancer ([place]), and Vestibular migraine. here with:  1. OSA on CPAP  - CPAP compliance excellent -Residual AHI is slightly elevated we will increase pressure to 9 cm of water -Discussed sleep hygiene -Discussed using melatonin 1 to 3 mg 2 hours before bedtime - Encourage patient to use CPAP nightly and > 4 hours each night - F/U in 1 year or sooner if needed   I spent 30 minutes of face-to-face and non-face-to-face time with patient.  This included previsit chart review, lab review, study review, order entry, electronic health record documentation, patient education.  Ward Givens, MSN, NP-C 01/22/2020, 9:31 AM [facility] [address], [place], [place] [address] [phone]  I reviewed the above note and documentation by the Nurse Practitioner and agree with the history, exam, assessment and plan as outlined above. I was available for consultation. Star Age, MD, PhD [facility])

## 2020-01-22 NOTE — Patient Instructions (Addendum)
Your Plan:  Continue CPAP nightly  Increase pressure 9 cm H20- DME will adjust If your symptoms worsen or you develop new symptoms please let us know   Thank you for coming to see [place] at [facility]. I hope we have been able to provide you high quality care today.  You may receive a patient satisfaction survey over the next few weeks. We would appreciate your feedback and comments so that we may continue to improve ourselves and the health of our patients.

## 2020-01-23 NOTE — Progress Notes (Signed)
Community message has been sent to [facility]  

## 2020-01-29 ENCOUNTER — Other Ambulatory Visit: Payer: Self-pay | Admitting: Neurological Surgery

## 2020-01-29 ENCOUNTER — Other Ambulatory Visit: Payer: Self-pay

## 2020-01-29 ENCOUNTER — Ambulatory Visit
Admission: RE | Admit: 2020-01-29 | Discharge: 2020-01-29 | Disposition: A | Discharge status: Home / Self Care | Payer: PPO | Source: Ambulatory Visit | Attending: Neurological Surgery | Admitting: Neurological Surgery

## 2020-01-29 DIAGNOSIS — G912 (Idiopathic) normal pressure hydrocephalus: Secondary | ICD-10-CM | POA: Diagnosis not present

## 2020-02-04 ENCOUNTER — Telehealth: Payer: Self-pay | Admitting: Adult Health

## 2020-02-04 NOTE — Telephone Encounter (Signed)
I returned pt's call. He is feeling discouraged. Hasn't had a "normal day" since he got the shunt placed. He stated last Wednesday Dr. [name] adjusted the shunt to drain more last Wednesday. He says within 1 hour the h/a came back. Pt said he was told by Dr. [name] to see Dr. [name] again as this may be migrainous. Pt stated has been trying Tylenol and the new medication Dr. [name] prescribed. Pt accepted the soonest appt we have which is tomorrow at 11 AM with [facility] NP. Pt verbalized appreciation for the call. I tried to his answer his questions as I could but patient will have more questions to discuss at the office visit.

## 2020-02-04 NOTE — Telephone Encounter (Signed)
Pt has called asking for a call from [facility] re: his Dr Sherlyn Lick telling him to see Dr [name] re: his off and on again severe headache.  Pt was told about Dr Cathren Laine next available appointment being 07-28. Pt declined but accepted an appointment with Amy,NP re: his headaches.  Pt is asking for a call from [facility]

## 2020-02-05 ENCOUNTER — Encounter: Payer: Self-pay | Admitting: Adult Health

## 2020-02-05 ENCOUNTER — Other Ambulatory Visit: Payer: Self-pay

## 2020-02-05 ENCOUNTER — Ambulatory Visit: Payer: PPO | Admitting: Adult Health

## 2020-02-05 VITALS — BP 173/82 | HR 61 | Ht 71.0 in | Wt 184.0 lb

## 2020-02-05 DIAGNOSIS — G43809 Other migraine, not intractable, without status migrainosus: Secondary | ICD-10-CM

## 2020-02-05 DIAGNOSIS — G43009 Migraine without aura, not intractable, without status migrainosus: Secondary | ICD-10-CM | POA: Diagnosis not present

## 2020-02-05 DIAGNOSIS — R519 Headache, unspecified: Secondary | ICD-10-CM | POA: Diagnosis not present

## 2020-02-05 MED ORDER — TOPIRAMATE 25 MG PO TABS: 25.0000 mg | ORAL_TABLET | Freq: Every day | ORAL | 5 refills | Status: DC

## 2020-02-05 NOTE — Progress Notes (Addendum)
PATIENT: Andrew Hayes DOB: 04/23/1941  REASON FOR VISIT: follow up HISTORY FROM: patient  HISTORY OF PRESENT ILLNESS: Today 02/05/20:  Andrew Hayes is a 79 year old male with a history of vestibular migraines and normal pressure hydrocephalus post shunting.  He saw Dr. Venetia Hayes last week and his shunt was adjusted from her drainage.  He reports that since then his headaches have increased.  He states that he has a headache every day typically on the right side of the head in the frontal region.  He reports the worst his pain is been is a 7-8/10.  Currently it is 4 out of 10.  He reports that he has been taking extra strength Tylenol on a daily basis.  Reports that his headache does improve within 30 to 40 minutes if he lays down.  He reports with activity the headache seems to come back.  He reports initially after having the shunt if he coughed had a bowel movement or lifting object he felt more pressure in the brain.  He reports that his hearing has also worsened on the right side.  He reports that he is having mild dizziness with his headaches.  With more severe dizziness he does have nausea and vomiting.  Denies photophobia and phonophobia.  He returns today for an evaluation.  HISTORY Vestibular migraine and hydrocephalus normal pressure status post shunting  Interval history 01/16/2020: He had the shunting but he developed a headache. The headache is more on the right side behind the eye socket and is throbbing but just an ache, just irritating now but it did get to 6-7/10. He started using his cpap again. He feels improved with his vertigo and headache. We can try nurtec and a nerve block, will not send to infusion as he looks completely comfortable. He is having more of his vestibular episodes, we discussed trying acute management medications and if it gets worse or more frequent trying a preventative. He used to go once a year or every 6 months, now its more monthly but not as  severe. So we will try nurtec. He will follow up with Dr. Zada Hayes to change the shunt settings.   Performed by Dr. Jaynee Hayes M.D. All procedures a documented blood were medically necessary, reasonable and appropriate based on the patient's history, medical diagnosis and physician opinion. Verbal informed consent was obtained from the patient, patient was informed of potential risk of procedure, including bruising, bleeding, hematoma formation, infection, muscle weakness, muscle pain, numbness, transient hypertension, transient hyperglycemia and transient insomnia among others. All areas injected were topically clean with isopropyl rubbing alcohol. Nonsterile nonlatex gloves were worn during the procedure.  1. Greater occipital nerve block [phone]). The greater occipital nerve site was identified at the nuchal line medial to the occipital artery. Medication was injected into the  right occipital nerve areas and suboccipital areas. Patient's condition is associated with inflammation of the greater occipital nerve and associated multiple groups. Injection was deemed medically necessary, reasonable and appropriate. Injection represents a separate and unique surgical service.  2. Supraorbital nerve block ([id number]): Supraorbital nerve site was identified along the incision of the frontal bone on the orbital/supraorbital ridge. Medication was injected into the right supraorbital nerve areas. Patient's condition is associated with inflammation of the supraorbital and associated muscle groups. Injection was deemed medically necessary, reasonable and appropriate. Injection represents a separate and unique surgical service.   Interval history January 15, 2020: Patient has undergone lumbar drain the last several days, he was discharged  from the hospital just 2 days ago, I did discuss his case with Dr. Venetia Hayes who stated he had a headache in the hospital and adjusted his drain, patients here for  headache.   Interval history 10/16/2019: Patient for follow up after lumbar drain placement. Patient feels as though he improved as far as his balance, he definitely improved. It was not "drastic" but it was signigifant. I reminded him we are catching this early and so his improvement may not be as drastic as someone we diagnose in the later stages of NPH. Also discuss his episodes of dizziness may also be due to possible NPH ve vestibular migraines. We discussed this at length and he is going to see Dr Andrew Hayes.  Interval history 08/15/2019: Patient with episodes of dizziness unclear etiology. Possibly central associated with migraines. He has had 2 episodes and treated with migraine IV infusion of Depacon. We discussed migraine preventative, he says meats with nitrates cause a lot of problems and triggers episodes, He doesn't have a problem with bacon but feels lunch meat affects him particularly salami. He does not have these often. 3 years ago he was having them every few months, now he has had a few more. He declines preventative, he will try to take it sooner right at the onset he will take rizatriptan. We also discussed his MRI today, reviewed images, he has some memory problems, he feels his balance is poor. His brother has NPH. He feels his memory is impaired. Reviewed MRI images with him, showed him very large ventricles.  Interval history: Patient started experiencing vestibular symptoms several days ago. 2 days ago it put him in bed. Unclear if vestibular migraines or other etiology. Discussed we should take him to the IV infusion lab and treat him like a migraine and if symptoms resolve then this is a likely indication this is central in origin and not due to canaliths for example, I suspect vestibular migraines, discussed with patient and agreed on treatment. In the furute he will call then symptoms starts for 1g depacon, today his symptoms resolved afte rtreatment which included a fogginess,  dizzines, not feeling right in the head, imbalance.  HPI:  Andrew Hayes is a 79 y.o. male here as requested by Andrew Fess, MD for dizziness. He was 79 years old with the first episode of vertigo, he has to lay down, a feeling of spinning and if he doesn't lay down perfectly flat keep his head on the bed he gets nauseated. Worst one was 4 years ago he was in bed for days and every time he got up he vomited. He has been evaluated by [facility] and multiple ENTs and diagnosed with vestibular migraines. He will episodes are a little dizziness, he takes rizatriptan and it does get very bad after that, but he has a "hangover" and may not feel good for a few days but he can avoid the severe vertigo and vomiting. He is getting these "little" episodes more frequently 2x a month that last for 4-5 days. No inciting events, he has evaluated all possible triggers and none found. He even tried a migraine diet for 3-4 months recommended by [facility] and didn;t help. He never gets headaches with the episodes. He has been to vestibular therapy. He drinks enough water, he has had brain MRIs, He is having more frequent episodes, he feels his balance is impaired and sometimes he does not walk well, hearing changes. No Fhx of migraines.  Reviewed notes, labs and imaging  from outside physicians, which showed:  IMPRESSION: MRI brain 2016 personally reviewed images 1. No imaging explanation for vestibular cochlear symptoms, as above. 2. Ventriculomegaly. Are there any symptoms of normal pressure hydrocephalus  REVIEW OF SYSTEMS: Out of a complete 14 system review of symptoms, the patient complains only of the following symptoms, and all other reviewed systems are negative.  See HPI  ALLERGIES: Allergies  Allergen Reactions  . Percocet [Oxycodone-Acetaminophen] Other (See Comments)    Patient states he is unable to tolerate    HOME MEDICATIONS: Outpatient Medications Prior to Visit  Medication Sig Dispense Refill  .  atorvastatin (LIPITOR) 10 MG tablet Take 10 mg by mouth daily.     . Cholecalciferol (VITAMIN D) 50 MCG (2000 UT) CAPS Take 4,000 Units by mouth daily.     . Coenzyme Q10 200 MG capsule Take 200 mg by mouth daily.     Marland Kitchen HYDROcodone-acetaminophen (NORCO/VICODIN) 5-325 MG tablet Take 1 tablet by mouth every 4 (four) hours as needed (pain). 20 tablet 0  . meclizine (ANTIVERT) 25 MG tablet Take 1 tablet (25 mg total) by mouth every 6 (six) hours as needed for dizziness or nausea. 30 tablet 6  . Multiple Vitamin (MULTI-VITAMIN PO) Take 1 tablet by mouth daily.     . Omega-3 Fatty Acids (FISH OIL PO) Take 2,000 mg by mouth daily.     . Rimegepant Sulfate (NURTEC) 75 MG TBDP Take 75 mg by mouth daily as needed. For migraines. Take as close to onset of migraine as possible. One daily maximum. 10 tablet 0  . rizatriptan (MAXALT-MLT) 10 MG disintegrating tablet Take 1 tablet (10 mg total) by mouth as needed for migraine. May repeat in 2 hours if needed 10 tablet 11   No facility-administered medications prior to visit.    PAST MEDICAL HISTORY: Past Medical History:  Diagnosis Date  . Actinic keratoses   . Arthritis   . Diverticulosis    Mild  . Elevated fasting glucose    Mildly  . Herniated disc   . Junctional nevus   . Lipidemia    Mixed  . Lyme disease 2020   pt placed on abx and stated he stopped taking vitamins so as not to interfere.  . Mild sleep apnea    CPAP  . Organic impotence   . Plantar fasciitis   . Pneumonia 1966  . Prostate cancer ([place])   . Vestibular migraine     PAST SURGICAL HISTORY: Past Surgical History:  Procedure Laterality Date  . APPENDECTOMY    . COLONOSCOPY    . ELBOW SURGERY  2013  . KNEE ARTHROSCOPY  2013   knee and elbow  . PLACEMENT OF LUMBAR DRAIN N/A 09/17/2019   Procedure: PLACEMENT OF LUMBAR DRAIN;  Surgeon: Judith Part, MD;  Location: [facility];  Service: Neurosurgery;  Laterality: N/A;  Lumbar drain placement  . PROSTATECTOMY  1986  .  TONSILLECTOMY    . TONSILLECTOMY    . VENTRICULOPERITONEAL SHUNT Right 01/07/2020   Procedure: Right ventriculoperitoneal shunt placement;  Surgeon: Judith Part, MD;  Location: [facility];  Service: Neurosurgery;  Laterality: Right;    FAMILY HISTORY: Family History  Problem Relation Age of Onset  . Stroke Mother   . Hypertension Mother   . Parkinson's disease Mother   . COPD Father   . Dementia Brother   . Hydrocephalus Brother   . Parkinson's disease Brother   . Parkinson's disease Sister   . Colon cancer Neg Hx   .  Migraines Neg Hx        none to his knowledge    SOCIAL HISTORY: Social History   Socioeconomic History  . Marital status: Married    Spouse name: Hassan Rowan  . Number of children: 2  . Years of education: 49  . Highest education level: Not on file  Occupational History  . Occupation: cpa    Employer: RETIRED    Comment: retired  Tobacco Use  . Smoking status: Former Smoker    Packs/day: 2.00    Years: 24.00    Pack years: 48.00    Types: Cigarettes    Quit date: 10/11/1984    Years since quitting: 35.3  . Smokeless tobacco: Never Used  . Tobacco comment: briefly used smokeless tobacco  Substance and Sexual Activity  . Alcohol use: Yes    Alcohol/week: 7.0 standard drinks    Types: 7 Standard drinks or equivalent per week    Comment: 7 beers weekly  . Drug use: No  . Sexual activity: Not on file  Other Topics Concern  . Not on file  Social History Narrative   Patient lives at home with family.   Patient consumes about 2 cups of caffeine daily,   right handed.   2 children (1 deceased)   Social Determinants of Health   Financial Resource Strain:   . Difficulty of Paying Living Expenses:   Food Insecurity:   . Worried About Charity fundraiser in the Last Year:   . Arboriculturist in the Last Year:   Transportation Needs:   . Film/video editor (Medical):   Marland Kitchen Lack of Transportation (Non-Medical):   Physical Activity:   . Days of  Exercise per Week:   . Minutes of Exercise per Session:   Stress:   . Feeling of Stress :   Social Connections:   . Frequency of Communication with Friends and Family:   . Frequency of Social Gatherings with Friends and Family:   . Attends Religious Services:   . Active Member of Clubs or Organizations:   . Attends Archivist Meetings:   Marland Kitchen Marital Status:   Intimate Partner Violence:   . Fear of Current or Ex-Partner:   . Emotionally Abused:   Marland Kitchen Physically Abused:   . Sexually Abused:       PHYSICAL EXAM  Vitals:   02/05/20 1100  BP: (!) 173/82  Pulse: 61  Weight: 184 lb (83.5 kg)  Height: 5\' 11"  (1.803 m)   Body mass index is 25.66 kg/m.  Generalized: Well developed, in no acute distress   Neurological examination  Mentation: Alert oriented to time, place, history taking. Follows all commands speech and language fluent Cranial nerve II-XII: Pupils were equal round reactive to light. Extraocular movements were full, visual field were full on confrontational test. Facial sensation and strength were normal. Uvula tongue midline. Head turning and shoulder shrug  were normal and symmetric. Motor: The motor testing reveals 5 over 5 strength of all 4 extremities. Good symmetric motor tone is noted throughout.  Sensory: Sensory testing is intact to soft touch on all 4 extremities. No evidence of extinction is noted.  Coordination: Cerebellar testing reveals good finger-nose-finger and heel-to-shin bilaterally.  Gait and station: Gait is normal. Tandem gait is normal. Romberg is negative. No drift is seen.  Reflexes: Deep tendon reflexes are symmetric and normal bilaterally.   DIAGNOSTIC DATA (LABS, IMAGING, TESTING) - I reviewed patient records, labs, notes, testing and imaging myself where  available.  Lab Results  Component Value Date   WBC 10.5 01/07/2020   HGB 14.8 01/07/2020   HCT 43.6 01/07/2020   MCV 95.4 01/07/2020   PLT 220 01/07/2020      Component  Value Date/Time   NA 143 01/03/2020 1043   NA 143 05/21/2019 0940   K 3.8 01/03/2020 1043   CL 109 01/03/2020 1043   CO2 26 01/03/2020 1043   GLUCOSE 70 01/03/2020 1043   BUN 14 01/03/2020 1043   BUN 21 05/21/2019 0940   CREATININE 1.17 01/07/2020 1932   CALCIUM 9.6 01/03/2020 1043   GFRNONAA >60 01/07/2020 1932   GFRAA >60 01/07/2020 1932   No results found for: CHOL, HDL, LDLCALC, LDLDIRECT, TRIG, CHOLHDL No results found for: HGBA1C Lab Results  Component Value Date   VITAMINB12 1,197 08/15/2019   Lab Results  Component Value Date   TSH 2.010 08/15/2019      ASSESSMENT AND PLAN 79 y.o. year old male  has a past medical history of Actinic keratoses, Arthritis, Diverticulosis, Elevated fasting glucose, Herniated disc, Junctional nevus, Lipidemia, Lyme disease (2020), Mild sleep apnea, Organic impotence, Plantar fasciitis, Pneumonia (1966), Prostate cancer ([place]), and Vestibular migraine. here with:  1.  Daily headaches 2.  Normal pressure hydrocephalus with status post shunting  -Patient will be started on a trial of Topamax 25 mg at bedtime to see if this is beneficial for his ongoing headaches. -Cautioned the patient about using Tylenol daily as this could be causing rebound headaches. -Patient reports that Dr. Venetia Hayes is aware of his increased headaches after increasing the drainage. -Patient is advised that if his symptoms worsen or he develops new symptoms he should let us know. -Follow-up in 6 months or sooner if needed.  I spent 30 minutes of face-to-face and non-face-to-face time with patient.  This included previsit chart review, lab review, study review, order entry, electronic health record documentation, patient education.  Ward Givens, MSN, NP-C 02/05/2020, 10:58 AM [facility] [address], [place], [place] [address] [phone]  Made any corrections needed, and agree with history, physical, neuro exam,assessment and plan  as stated.     Sarina Ill, MD [facility]

## 2020-02-05 NOTE — Patient Instructions (Signed)
Your Plan:  Start Topamax 25 mg at bedtime If your symptoms worsen or you develop new symptoms please let us know.   Thank you for coming to see [place] at [facility]. I hope we have been able to provide you high quality care today.  You may receive a patient satisfaction survey over the next few weeks. We would appreciate your feedback and comments so that we may continue to improve ourselves and the health of our patients.  Topiramate tablets What is this medicine? TOPIRAMATE (toe PYRE a mate) is used to treat seizures in adults or children with epilepsy. It is also used for the prevention of migraine headaches. This medicine may be used for other purposes; ask your health care provider or pharmacist if you have questions. COMMON BRAND NAME(S): Topamax, Topiragen What should I tell my health care provider before I take this medicine? They need to know if you have any of these conditions:  bleeding disorders  kidney disease  lung or breathing disease, like asthma  suicidal thoughts, plans, or attempt; a previous suicide attempt by you or a family member  an unusual or allergic reaction to topiramate, other medicines, foods, dyes, or preservatives  pregnant or trying to get pregnant  breast-feeding How should I use this medicine? Take this medicine by mouth with a glass of water. Follow the directions on the prescription label. Do not cut, crush or chew this medicine. Swallow the tablets whole. You can take it with or without food. If it upsets your stomach, take it with food. Take your medicine at regular intervals. Do not take it more often than directed. Do not stop taking except on your doctor's advice. A special MedGuide will be given to you by the pharmacist with each prescription and refill. Be sure to read this information carefully each time. Talk to your pediatrician regarding the use of this medicine in children. While this drug may be prescribed for  children as young as 4 years of age for selected conditions, precautions do apply. Overdosage: If you think you have taken too much of this medicine contact a poison control center or emergency room at once. NOTE: This medicine is only for you. Do not share this medicine with others. What if I miss a dose? If you miss a dose, take it as soon as you can. If your next dose is to be taken in less than 6 hours, then do not take the missed dose. Take the next dose at your regular time. Do not take double or extra doses. What may interact with this medicine? This medicine may interact with the following medications:  acetazolamide  alcohol  antihistamines for allergy, cough, and cold  aspirin and aspirin-like medicines  atropine  birth control pills  certain medicines for anxiety or sleep  certain medicines for bladder problems like oxybutynin, tolterodine  certain medicines for depression like amitriptyline, fluoxetine, sertraline  certain medicines for seizures like carbamazepine, phenobarbital, phenytoin, primidone, valproic acid, zonisamide  certain medicines for stomach problems like dicyclomine, hyoscyamine  certain medicines for travel sickness like scopolamine  certain medicines for Parkinson's disease like benztropine, trihexyphenidyl  certain medicines that treat or prevent blood clots like warfarin, enoxaparin, dalteparin, apixaban, dabigatran, and rivaroxaban  digoxin  general anesthetics like halothane, isoflurane, methoxyflurane, propofol  hydrochlorothiazide  ipratropium  lithium  medicines that relax muscles for surgery  metformin  narcotic medicines for pain  NSAIDs, medicines for pain and inflammation, like ibuprofen or naproxen  phenothiazines like chlorpromazine,  mesoridazine, prochlorperazine, thioridazine  pioglitazone This list may not describe all possible interactions. Give your health care provider a list of all the medicines, herbs,  non-prescription drugs, or dietary supplements you use. Also tell them if you smoke, drink alcohol, or use illegal drugs. Some items may interact with your medicine. What should I watch for while using this medicine? Visit your doctor or health care professional for regular checks on your progress. Tell your health care professional if your symptoms do not start to get better or if they get worse. Do not stop taking except on your health care professional's advice. You may develop a severe reaction. Your health care professional will tell you how much medicine to take. Wear a medical ID bracelet or chain. Carry a card that describes your disease and details of your medicine and dosage times. This medicine can reduce the response of your body to heat or cold. Dress warm in cold weather and stay hydrated in hot weather. If possible, avoid extreme temperatures like saunas, hot tubs, very hot or cold showers, or activities that can cause dehydration such as vigorous exercise. Check with your health care professional if you have severe diarrhea, nausea, and vomiting, or if you sweat a lot. The loss of too much body fluid may make it dangerous for you to take this medicine. You may get drowsy or dizzy. Do not drive, use machinery, or do anything that needs mental alertness until you know how this medicine affects you. Do not stand up or sit up quickly, especially if you are an older patient. This reduces the risk of dizzy or fainting spells. Alcohol may interfere with the effect of this medicine. Avoid alcoholic drinks. Tell your health care professional right away if you have any change in your eyesight. Patients and their families should watch out for new or worsening depression or thoughts of suicide. Also watch out for sudden changes in feelings such as feeling anxious, agitated, panicky, irritable, hostile, aggressive, impulsive, severely restless, overly excited and hyperactive, or not being able to sleep.  If this happens, especially at the beginning of treatment or after a change in dose, call your healthcare professional. This medicine may cause serious skin reactions. They can happen weeks to months after starting the medicine. Contact your health care provider right away if you notice fevers or flu-like symptoms with a rash. The rash may be red or purple and then turn into blisters or peeling of the skin. Or, you might notice a red rash with swelling of the face, lips or lymph nodes in your neck or under your arms. Birth control may not work properly while you are taking this medicine. Talk to your health care professional about using an extra method of birth control. Women should inform their health care professional if they wish to become pregnant or think they might be pregnant. There is a potential for serious side effects and harm to an unborn child. Talk to your health care professional for more information. What side effects may I notice from receiving this medicine? Side effects that you should report to your doctor or health care professional as soon as possible:  allergic reactions like skin rash, itching or hives, swelling of the face, lips, or tongue  blood in the urine  changes in vision  confusion  loss of memory  pain in lower back or side  pain when urinating  redness, blistering, peeling or loosening of the skin, including inside the mouth  signs and symptoms  of bleeding such as bloody or black, tarry stools; red or dark brown urine; spitting up blood or brown material that looks like coffee grounds; red spots on the skin; unusual bruising or bleeding from the eyes, gums, or nose  signs and symptoms of increased acid in the body like breathing fast; fast heartbeat; headache; confusion; unusually weak or tired; nausea, vomiting  suicidal thoughts, mood changes  trouble speaking or understanding  unusual sweating  unusually weak or tired Side effects that usually do  not require medical attention (report to your doctor or health care professional if they continue or are bothersome):  dizziness  drowsiness  fever  loss of appetite  nausea, vomiting  pain, tingling, numbness in the hands or feet  stomach pain  tiredness  upset stomach This list may not describe all possible side effects. Call your doctor for medical advice about side effects. You may report side effects to FDA at 1-800-FDA-1088. Where should I keep my medicine? Keep out of the reach of children. Store at room temperature between 15 and 30 degrees C (59 and 86 degrees F). Throw away any unused medicine after the expiration date. NOTE: This sheet is a summary. It may not cover all possible information. If you have questions about this medicine, talk to your doctor, pharmacist, or health care provider.  2020 Elsevier/Gold Standard (2019-04-26 15:07:20)

## 2020-02-06 ENCOUNTER — Ambulatory Visit: Payer: Self-pay | Admitting: Family Medicine

## 2020-02-06 ENCOUNTER — Ambulatory Visit: Payer: PPO | Admitting: Family Medicine

## 2020-02-12 ENCOUNTER — Encounter: Payer: Self-pay | Admitting: Adult Health

## 2020-02-13 MED ORDER — TOPIRAMATE 25 MG PO TABS: 50.0000 mg | ORAL_TABLET | Freq: Every day | ORAL | 5 refills | Status: DC

## 2020-02-22 ENCOUNTER — Encounter: Payer: Self-pay | Admitting: Adult Health

## 2020-02-22 DIAGNOSIS — G4733 Obstructive sleep apnea (adult) (pediatric): Secondary | ICD-10-CM

## 2020-02-25 NOTE — Telephone Encounter (Signed)
Order placed please send to DME

## 2020-02-25 NOTE — Telephone Encounter (Signed)
Lucretia Roers, RN; Darlina Guys; Rockne Coons, Jennifer L Got it.     Previous Messages   ----- Message -----  From: Brandon Melnick, RN  Sent: 02/25/2020 10:58 AM EDT  To: Darlina Guys, Roland Earl  Subject: cpap supplies                   Zara Council [place] new cpap orders for this pt. Thanks [facility] RN  Male, 79 y.o., Jan 11, 1941

## 2020-02-26 NOTE — Telephone Encounter (Signed)
Order for cpap supplies sent to [facility] via [employer]. Confirmation received that the order transmitted was successful.

## 2020-03-04 DIAGNOSIS — G4733 Obstructive sleep apnea (adult) (pediatric): Secondary | ICD-10-CM | POA: Diagnosis not present

## 2020-03-13 DIAGNOSIS — G4733 Obstructive sleep apnea (adult) (pediatric): Secondary | ICD-10-CM | POA: Diagnosis not present

## 2020-03-19 DIAGNOSIS — G4733 Obstructive sleep apnea (adult) (pediatric): Secondary | ICD-10-CM | POA: Diagnosis not present

## 2020-04-04 ENCOUNTER — Encounter: Payer: Self-pay | Admitting: Adult Health

## 2020-04-06 ENCOUNTER — Encounter: Payer: Self-pay | Admitting: Adult Health

## 2020-04-09 DIAGNOSIS — Z8546 Personal history of malignant neoplasm of prostate: Secondary | ICD-10-CM | POA: Diagnosis not present

## 2020-04-09 DIAGNOSIS — G4730 Sleep apnea, unspecified: Secondary | ICD-10-CM | POA: Diagnosis not present

## 2020-04-09 DIAGNOSIS — L570 Actinic keratosis: Secondary | ICD-10-CM | POA: Diagnosis not present

## 2020-04-09 DIAGNOSIS — Z13220 Encounter for screening for lipoid disorders: Secondary | ICD-10-CM | POA: Diagnosis not present

## 2020-04-09 DIAGNOSIS — Z0000 Encounter for general adult medical examination without abnormal findings: Secondary | ICD-10-CM | POA: Diagnosis not present

## 2020-04-09 DIAGNOSIS — G912 (Idiopathic) normal pressure hydrocephalus: Secondary | ICD-10-CM | POA: Diagnosis not present

## 2020-04-09 DIAGNOSIS — R7301 Impaired fasting glucose: Secondary | ICD-10-CM | POA: Diagnosis not present

## 2020-04-09 DIAGNOSIS — Z79899 Other long term (current) drug therapy: Secondary | ICD-10-CM | POA: Diagnosis not present

## 2020-04-09 DIAGNOSIS — Z8719 Personal history of other diseases of the digestive system: Secondary | ICD-10-CM | POA: Diagnosis not present

## 2020-04-09 DIAGNOSIS — Z86010 Personal history of colonic polyps: Secondary | ICD-10-CM | POA: Diagnosis not present

## 2020-04-21 ENCOUNTER — Telehealth: Payer: Self-pay | Admitting: Orthopaedic Surgery

## 2020-04-21 NOTE — Telephone Encounter (Signed)
Pt no longer needs a CB.

## 2020-04-30 ENCOUNTER — Telehealth: Payer: Self-pay | Admitting: Orthopaedic Surgery

## 2020-04-30 ENCOUNTER — Telehealth: Payer: Self-pay | Admitting: Radiology

## 2020-04-30 NOTE — Telephone Encounter (Signed)
Was calling in regards to wife. Note put in her chart.

## 2020-04-30 NOTE — Telephone Encounter (Signed)
Patient called.   He did not disclose why but he is requesting a call back from either [place] or Dr.Yates himself as soon as possible.   Call back: [phone]

## 2020-04-30 NOTE — Telephone Encounter (Signed)
error 

## 2020-06-10 DIAGNOSIS — G4733 Obstructive sleep apnea (adult) (pediatric): Secondary | ICD-10-CM | POA: Diagnosis not present

## 2020-06-18 ENCOUNTER — Telehealth: Payer: Self-pay | Admitting: Gastroenterology

## 2020-06-18 NOTE — Telephone Encounter (Signed)
Patient requesting an earlier appt.  He has been rescheduled to see Dr. Fuller Plan on 06/23/20

## 2020-06-18 NOTE — Telephone Encounter (Signed)
Patient is requesting to speak with a nurse in reference to his upcoming appt and symptoms did not provide any further information

## 2020-06-23 ENCOUNTER — Encounter: Payer: Self-pay | Admitting: Gastroenterology

## 2020-06-23 ENCOUNTER — Other Ambulatory Visit (INDEPENDENT_AMBULATORY_CARE_PROVIDER_SITE_OTHER): Payer: PPO

## 2020-06-23 ENCOUNTER — Ambulatory Visit: Payer: PPO | Admitting: Gastroenterology

## 2020-06-23 VITALS — BP 116/60 | HR 72 | Ht 70.5 in | Wt 189.2 lb

## 2020-06-23 DIAGNOSIS — Z86010 Personal history of colonic polyps: Secondary | ICD-10-CM

## 2020-06-23 DIAGNOSIS — R1030 Lower abdominal pain, unspecified: Secondary | ICD-10-CM | POA: Diagnosis not present

## 2020-06-23 LAB — COMPREHENSIVE METABOLIC PANEL
ALT: 20 U/L (ref 0–53)
AST: 25 U/L (ref 0–37)
Albumin: 4.7 g/dL (ref 3.5–5.2)
Alkaline Phosphatase: 84 U/L (ref 39–117)
BUN: 21 mg/dL (ref 6–23)
CO2: 25 mEq/L (ref 19–32)
Calcium: 9.4 mg/dL (ref 8.4–10.5)
Chloride: 105 mEq/L (ref 96–112)
Creatinine, Ser: 1.05 mg/dL (ref 0.40–1.50)
GFR: 68.48 mL/min (ref >60.00)
Glucose, Bld: 104 mg/dL — ABNORMAL HIGH (ref 70–99)
Potassium: 4 mEq/L (ref 3.5–5.1)
Sodium: 140 mEq/L (ref 135–145)
Total Bilirubin: 2.1 mg/dL — ABNORMAL HIGH (ref 0.2–1.2)
Total Protein: 7.3 g/dL (ref 6.0–8.3)

## 2020-06-23 LAB — CBC WITH DIFFERENTIAL/PLATELET
Basophils Absolute: 0 10*3/uL (ref 0.0–0.1)
Basophils Relative: 0.7 % (ref 0.0–3.0)
Eosinophils Absolute: 0.1 10*3/uL (ref 0.0–0.7)
Eosinophils Relative: 1.6 % (ref 0.0–5.0)
HCT: 44.1 % (ref 39.0–52.0)
Hemoglobin: 15.1 g/dL (ref 13.0–17.0)
Lymphocytes Relative: 23.4 % (ref 12.0–46.0)
Lymphs Abs: 1.3 10*3/uL (ref 0.7–4.0)
MCHC: 34.3 g/dL (ref 30.0–36.0)
MCV: 96.1 fl (ref 78.0–100.0)
Monocytes Absolute: 0.5 10*3/uL (ref 0.1–1.0)
Monocytes Relative: 9.1 % (ref 3.0–12.0)
Neutro Abs: 3.6 10*3/uL (ref 1.4–7.7)
Neutrophils Relative %: 65.2 % (ref 43.0–77.0)
Platelets: 230 10*3/uL (ref 150.0–400.0)
RBC: 4.59 Mil/uL (ref 4.22–5.81)
RDW: 13.2 % (ref 11.5–15.5)
WBC: 5.5 10*3/uL (ref 4.0–10.5)

## 2020-06-23 LAB — LIPASE: Lipase: 43 U/L (ref 11.0–59.0)

## 2020-06-23 NOTE — Progress Notes (Signed)
History of Present Illness: This is a 79 year old male referred by [name], MD for the evaluation of lower abdominal pain.  He relates intermittent suprapubic pressure and occasionally a cramp-like sensation that is mild.  Symptoms have occurred  intermittently over the past several months.  No other digestive complaints.  He is status post right ventriculoperitoneal in March 2021 for normal pressure hydrocephalus.  He states that he developed severe headaches so the shunt was turned off.  It was not removed.  He notes his abdominal complaints started shortly after shunt placement.  Last colonoscopy performed in July 2017 was normal.  One small polyp was removed which was benign polypoid mucosa.  He has a history of adenomatous colon polyps. Denies weight loss, constipation, diarrhea, change in stool caliber, melena, hematochezia, nausea, vomiting, dysphagia, reflux symptoms, chest pain.     Allergies  Allergen Reactions  . Percocet [Oxycodone-Acetaminophen] Other (See Comments)    Patient states he is unable to tolerate   Outpatient Medications Prior to Visit  Medication Sig Dispense Refill  . Acetaminophen (TYLENOL PO) Take by mouth.    Marland Kitchen atorvastatin (LIPITOR) 10 MG tablet Take 10 mg by mouth daily.     . meclizine (ANTIVERT) 25 MG tablet Take 1 tablet (25 mg total) by mouth every 6 (six) hours as needed for dizziness or nausea. 30 tablet 6  . rizatriptan (MAXALT-MLT) 10 MG disintegrating tablet Take 1 tablet (10 mg total) by mouth as needed for migraine. May repeat in 2 hours if needed 10 tablet 11  . Rimegepant Sulfate (NURTEC) 75 MG TBDP Take 75 mg by mouth daily as needed. For migraines. Take as close to onset of migraine as possible. One daily maximum. 10 tablet 0  . topiramate (TOPAMAX) 25 MG tablet Take 2 tablets (50 mg total) by mouth at bedtime. 60 tablet 5   No facility-administered medications prior to visit.   Past Medical History:  Diagnosis Date  . Actinic  keratoses   . Adenomatous polyp of colon 2006  . Arthritis   . Diverticulosis    Mild  . Elevated fasting glucose    Mildly  . Herniated disc   . Junctional nevus   . Lipidemia    Mixed  . Lyme disease 2020   pt placed on abx and stated he stopped taking vitamins so as not to interfere.  . Mild sleep apnea    CPAP  . Organic impotence   . Plantar fasciitis   . Pneumonia 1966  . Prostate cancer ([place])   . Vestibular migraine    Past Surgical History:  Procedure Laterality Date  . APPENDECTOMY    . COLONOSCOPY    . ELBOW SURGERY Left 2013  . KNEE ARTHROSCOPY Bilateral 2013   knee and elbow  . PLACEMENT OF LUMBAR DRAIN N/A 09/17/2019   Procedure: PLACEMENT OF LUMBAR DRAIN;  Surgeon: Judith Part, MD;  Location: [facility];  Service: Neurosurgery;  Laterality: N/A;  Lumbar drain placement  . PROSTATECTOMY  1986  . TONSILLECTOMY    . VENTRICULOPERITONEAL SHUNT Right 01/07/2020   Procedure: Right ventriculoperitoneal shunt placement;  Surgeon: Judith Part, MD;  Location: [facility];  Service: Neurosurgery;  Laterality: Right;   Social History   Socioeconomic History  . Marital status: Married    Spouse name: Hassan Rowan  . Number of children: 2  . Years of education: 77  . Highest education level: Not on file  Occupational History  . Occupation: Manufacturing engineer:  RETIRED    Comment: retired  Tobacco Use  . Smoking status: Former Smoker    Packs/day: 2.00    Years: 24.00    Pack years: 48.00    Types: Cigarettes    Quit date: 10/11/1984    Years since quitting: 35.7  . Smokeless tobacco: Never Used  . Tobacco comment: briefly used smokeless tobacco  Vaping Use  . Vaping Use: Never used  Substance and Sexual Activity  . Alcohol use: Yes    Alcohol/week: 7.0 standard drinks    Types: 7 Standard drinks or equivalent per week    Comment: 1 per day  . Drug use: No  . Sexual activity: Not on file  Other Topics Concern  . Not on file  Social History Narrative    Patient lives at home with family.   Patient consumes about 2 cups of caffeine daily,   right handed.   2 children (1 deceased)   Social Determinants of Health   Financial Resource Strain:   . Difficulty of Paying Living Expenses: Not on file  Food Insecurity:   . Worried About Charity fundraiser in the Last Year: Not on file  . Ran Out of Food in the Last Year: Not on file  Transportation Needs:   . Lack of Transportation (Medical): Not on file  . Lack of Transportation (Non-Medical): Not on file  Physical Activity:   . Days of Exercise per Week: Not on file  . Minutes of Exercise per Session: Not on file  Stress:   . Feeling of Stress : Not on file  Social Connections:   . Frequency of Communication with Friends and Family: Not on file  . Frequency of Social Gatherings with Friends and Family: Not on file  . Attends Religious Services: Not on file  . Active Member of Clubs or Organizations: Not on file  . Attends Archivist Meetings: Not on file  . Marital Status: Not on file   Family History  Problem Relation Age of Onset  . Stroke Mother   . Hypertension Mother   . Parkinson's disease Mother   . COPD Father   . Dementia Brother   . Hydrocephalus Brother   . Parkinson's disease Brother   . Parkinson's disease Sister   . Congestive Heart Failure Sister   . Heart attack Maternal Grandfather        50's  . Other Paternal Grandmother        chilbirth  . Heart attack Paternal Grandfather        62's  . Heart disease Daughter        deceased at 33 month  . Colon cancer Neg Hx   . Migraines Neg Hx        none to his knowledge      Review of Systems: Pertinent positive and negative review of systems were noted in the above HPI section. All other review of systems were otherwise negative.   Physical Exam: General: Well developed, well nourished, no acute distress Head: Normocephalic and atraumatic Eyes:  sclerae anicteric, EOMI Ears: Normal auditory  acuity Mouth: Not examined, mask on during Covid-19 pandemic Neck: Supple, no masses or thyromegaly Lungs: Clear throughout to auscultation Heart: Regular rate and rhythm; no murmurs, rubs or bruits Abdomen: Soft, non tender and non distended. No masses, hepatosplenomegaly or hernias noted. Normal Bowel sounds Rectal: Not done Musculoskeletal: Symmetrical with no gross deformities  Skin: No lesions on visible extremities Pulses:  Normal pulses noted  Extremities: No clubbing, cyanosis, edema or deformities noted Neurological: Alert oriented x 4, grossly nonfocal Cervical Nodes:  No significant cervical adenopathy Inguinal Nodes: No significant inguinal adenopathy Psychological:  Alert and cooperative. Normal mood and affect   Assessment and Recommendations:  1.  Intermittent lower abdominal pain, suprapubic pressure and pain.  Etiology unclear.  CBC, CMP and lipase today.  Schedule CT abdomen and pelvis.  If etiology is not found proceed with colonoscopy for further evaluation.  I offered a trial of antispasmodics however the patient preferred to have the evaluation performed first before trying medications.  2.  Personal history of adenomatous colon polyps.  Surveillance colonoscopy recommended in July 2022.   cc: [name], MD [address] [place],  [place] [address]

## 2020-06-23 NOTE — Patient Instructions (Signed)
If you are age 77 or older, your body mass index should be between 23-30. Your Body mass index is 26.77 kg/m. If this is out of the aforementioned range listed, please consider follow up with your Primary Care Provider.  If you are age 64 or younger, your body mass index should be between 19-25. Your Body mass index is 26.77 kg/m. If this is out of the aformentioned range listed, please consider follow up with your Primary Care Provider.   Your provider has requested that you go to the basement level for lab work before leaving today. Press "B" on the elevator. The lab is located at the first door on the left as you exit the elevator.  You have been scheduled for a CT scan of the abdomen and pelvis at [facility], 1st floor Radiology. You are scheduled on 07/01/20  at 1pm. You should arrive 15 minutes prior to your appointment time for registration.  Please pick up 2 bottles of contrast from [place] at least 3 days prior to your scan. The solution may taste better if refrigerated, but do NOT add ice or any other liquid to this solution. Shake well before drinking.   Please follow the written instructions below on the day of your exam:   1) Do not eat anything after 10 am (4 hours prior to your test)   2) Drink 1 bottle of contrast @ 11:00 am (2 hours prior to your exam)  Remember to shake well before drinking and do NOT pour over ice.     Drink 1 bottle of contrast @ 12 pm (1 hour prior to your exam)   You may take any medications as prescribed with a small amount of water, if necessary. If you take any of the following medications: METFORMIN, GLUCOPHAGE, GLUCOVANCE, AVANDAMET, RIOMET, FORTAMET, ACTOPLUS MET, JANUMET, GLUMETZA or METAGLIP, you MAY be asked to HOLD this medication 48 hours AFTER the exam.   The purpose of you drinking the oral contrast is to aid in the visualization of your intestinal tract. The contrast solution may cause some diarrhea. Depending on your individual set  of symptoms, you may also receive an intravenous injection of x-ray contrast/dye. Plan on being at [place] for 45 minutes or longer, depending on the type of exam you are having performed.   If you have any questions regarding your exam or if you need to reschedule, you may call [facility] at [phone] between the hours of 8:00 am and 5:00 pm, Monday-Friday.   Thank you for choosing me and [facility].  Malcolm T. Stark, Jr., MD., FACG     

## 2020-06-24 DIAGNOSIS — M25512 Pain in left shoulder: Secondary | ICD-10-CM | POA: Diagnosis not present

## 2020-06-25 ENCOUNTER — Telehealth: Payer: Self-pay | Admitting: Gastroenterology

## 2020-06-25 ENCOUNTER — Other Ambulatory Visit: Payer: Self-pay

## 2020-06-25 DIAGNOSIS — M549 Dorsalgia, unspecified: Secondary | ICD-10-CM | POA: Diagnosis not present

## 2020-06-25 DIAGNOSIS — R7989 Other specified abnormal findings of blood chemistry: Secondary | ICD-10-CM

## 2020-06-25 NOTE — Telephone Encounter (Signed)
See results for additional details.  

## 2020-06-27 DIAGNOSIS — S46012A Strain of muscle(s) and tendon(s) of the rotator cuff of left shoulder, initial encounter: Secondary | ICD-10-CM | POA: Diagnosis not present

## 2020-07-01 ENCOUNTER — Ambulatory Visit (HOSPITAL_COMMUNITY)
Admission: RE | Admit: 2020-07-01 | Discharge: 2020-07-01 | Disposition: A | Discharge status: Home / Self Care | Payer: PPO | Source: Ambulatory Visit | Attending: Gastroenterology | Admitting: Gastroenterology

## 2020-07-01 ENCOUNTER — Encounter (HOSPITAL_COMMUNITY): Payer: Self-pay

## 2020-07-01 ENCOUNTER — Other Ambulatory Visit: Payer: Self-pay

## 2020-07-01 ENCOUNTER — Telehealth: Payer: Self-pay | Admitting: Gastroenterology

## 2020-07-01 DIAGNOSIS — K6389 Other specified diseases of intestine: Secondary | ICD-10-CM | POA: Diagnosis not present

## 2020-07-01 DIAGNOSIS — R1030 Lower abdominal pain, unspecified: Secondary | ICD-10-CM | POA: Insufficient documentation

## 2020-07-01 DIAGNOSIS — I700 Atherosclerosis of aorta: Secondary | ICD-10-CM | POA: Diagnosis not present

## 2020-07-01 DIAGNOSIS — S46012A Strain of muscle(s) and tendon(s) of the rotator cuff of left shoulder, initial encounter: Secondary | ICD-10-CM | POA: Diagnosis not present

## 2020-07-01 DIAGNOSIS — N281 Cyst of kidney, acquired: Secondary | ICD-10-CM | POA: Diagnosis not present

## 2020-07-01 DIAGNOSIS — K3189 Other diseases of stomach and duodenum: Secondary | ICD-10-CM | POA: Diagnosis not present

## 2020-07-01 MED ORDER — IOHEXOL 300 MG/ML  SOLN
100.0000 mL | Freq: Once | INTRAMUSCULAR | Status: AC | PRN
  Administered 2020-07-01: 100 mL via INTRAVENOUS

## 2020-07-01 NOTE — Telephone Encounter (Signed)
Olivia Mackie from [facility] [place] requesting a call back regarding the pt's CT scan results.  CB [phone]

## 2020-07-01 NOTE — Telephone Encounter (Signed)
Stranding around VP shunt is likely postoperative however he should ask his neurosurgeon to review.  Prostatectomy, lymphadenectomy, aortic atherosclerosis.  No GI findings.

## 2020-07-01 NOTE — Telephone Encounter (Signed)
GI called with call report on CT from today.  Please see findings/ impression below.   IMPRESSION: 1. Stranding in the body wall adjacent to the site of VP shunt insertion through the rectus muscle no focal fluid collection in this area but there is stranding and skin thickening on image 50 of series 2. Findings may represent cellulitis. Correlate with any signs of active inflammation in this area. Findings could also be related to postoperative scarring though there are no additional postoperative images available to allow for comparison. Would also correlate with any history of hematoma after placement in this location 2. Post prostatectomy and pelvic lymphadenectomy. 3. Aortic atherosclerosis.

## 2020-07-02 NOTE — Telephone Encounter (Signed)
Pt is calling about his CT results.  He said he received the report on mychart but has some questions

## 2020-07-02 NOTE — Telephone Encounter (Signed)
I reviewed the results with the patient.  Copy faxed to Dr. Hardie Pulley at [facility]

## 2020-07-04 DIAGNOSIS — S46012A Strain of muscle(s) and tendon(s) of the rotator cuff of left shoulder, initial encounter: Secondary | ICD-10-CM | POA: Diagnosis not present

## 2020-07-08 ENCOUNTER — Ambulatory Visit: Payer: PPO | Admitting: Nurse Practitioner

## 2020-07-08 DIAGNOSIS — S46012A Strain of muscle(s) and tendon(s) of the rotator cuff of left shoulder, initial encounter: Secondary | ICD-10-CM | POA: Diagnosis not present

## 2020-07-09 ENCOUNTER — Other Ambulatory Visit: Payer: PPO

## 2020-07-09 DIAGNOSIS — R7989 Other specified abnormal findings of blood chemistry: Secondary | ICD-10-CM

## 2020-07-09 DIAGNOSIS — R945 Abnormal results of liver function studies: Secondary | ICD-10-CM | POA: Diagnosis not present

## 2020-07-10 LAB — BILIRUBIN, FRACTIONATED(TOT/DIR/INDIR)
Bilirubin, Direct: 0.3 mg/dL — ABNORMAL HIGH (ref 0.0–0.2)
Indirect Bilirubin: 1.8 mg/dL (calc) — ABNORMAL HIGH (ref 0.2–1.2)
Total Bilirubin: 2.1 mg/dL — ABNORMAL HIGH (ref 0.2–1.2)

## 2020-07-11 DIAGNOSIS — G4730 Sleep apnea, unspecified: Secondary | ICD-10-CM | POA: Diagnosis not present

## 2020-07-11 DIAGNOSIS — Z8719 Personal history of other diseases of the digestive system: Secondary | ICD-10-CM | POA: Diagnosis not present

## 2020-07-11 DIAGNOSIS — R7309 Other abnormal glucose: Secondary | ICD-10-CM | POA: Diagnosis not present

## 2020-07-11 DIAGNOSIS — E7800 Pure hypercholesterolemia, unspecified: Secondary | ICD-10-CM | POA: Diagnosis not present

## 2020-07-11 DIAGNOSIS — R7301 Impaired fasting glucose: Secondary | ICD-10-CM | POA: Diagnosis not present

## 2020-07-11 DIAGNOSIS — G912 (Idiopathic) normal pressure hydrocephalus: Secondary | ICD-10-CM | POA: Diagnosis not present

## 2020-07-11 DIAGNOSIS — Z8546 Personal history of malignant neoplasm of prostate: Secondary | ICD-10-CM | POA: Diagnosis not present

## 2020-07-11 DIAGNOSIS — Z86010 Personal history of colonic polyps: Secondary | ICD-10-CM | POA: Diagnosis not present

## 2020-07-11 DIAGNOSIS — L570 Actinic keratosis: Secondary | ICD-10-CM | POA: Diagnosis not present

## 2020-07-11 DIAGNOSIS — Z136 Encounter for screening for cardiovascular disorders: Secondary | ICD-10-CM | POA: Diagnosis not present

## 2020-07-11 DIAGNOSIS — Z0000 Encounter for general adult medical examination without abnormal findings: Secondary | ICD-10-CM | POA: Diagnosis not present

## 2020-07-11 DIAGNOSIS — Z79899 Other long term (current) drug therapy: Secondary | ICD-10-CM | POA: Diagnosis not present

## 2020-07-15 DIAGNOSIS — S46012A Strain of muscle(s) and tendon(s) of the rotator cuff of left shoulder, initial encounter: Secondary | ICD-10-CM | POA: Diagnosis not present

## 2020-07-22 DIAGNOSIS — S46012A Strain of muscle(s) and tendon(s) of the rotator cuff of left shoulder, initial encounter: Secondary | ICD-10-CM | POA: Diagnosis not present

## 2020-09-09 DIAGNOSIS — G4733 Obstructive sleep apnea (adult) (pediatric): Secondary | ICD-10-CM | POA: Diagnosis not present

## 2020-09-26 DIAGNOSIS — S46012A Strain of muscle(s) and tendon(s) of the rotator cuff of left shoulder, initial encounter: Secondary | ICD-10-CM | POA: Diagnosis not present

## 2020-09-30 DIAGNOSIS — S46012A Strain of muscle(s) and tendon(s) of the rotator cuff of left shoulder, initial encounter: Secondary | ICD-10-CM | POA: Diagnosis not present

## 2020-10-02 DIAGNOSIS — S46012A Strain of muscle(s) and tendon(s) of the rotator cuff of left shoulder, initial encounter: Secondary | ICD-10-CM | POA: Diagnosis not present

## 2020-10-07 DIAGNOSIS — S46012A Strain of muscle(s) and tendon(s) of the rotator cuff of left shoulder, initial encounter: Secondary | ICD-10-CM | POA: Diagnosis not present

## 2020-11-06 ENCOUNTER — Telehealth: Payer: Self-pay | Admitting: Orthopaedic Surgery

## 2020-11-06 NOTE — Telephone Encounter (Signed)
Received medical records request from patient needing printed results of last back and neck MRI written report

## 2020-11-07 NOTE — Telephone Encounter (Signed)
Will process

## 2020-11-19 DIAGNOSIS — G609 Hereditary and idiopathic neuropathy, unspecified: Secondary | ICD-10-CM | POA: Diagnosis not present

## 2020-11-19 DIAGNOSIS — I7300 Raynaud's syndrome without gangrene: Secondary | ICD-10-CM | POA: Diagnosis not present

## 2020-11-19 DIAGNOSIS — R270 Ataxia, unspecified: Secondary | ICD-10-CM | POA: Diagnosis not present

## 2020-11-19 DIAGNOSIS — R200 Anesthesia of skin: Secondary | ICD-10-CM | POA: Diagnosis not present

## 2020-11-19 DIAGNOSIS — M545 Low back pain, unspecified: Secondary | ICD-10-CM | POA: Diagnosis not present

## 2020-11-21 DIAGNOSIS — M545 Low back pain, unspecified: Secondary | ICD-10-CM | POA: Diagnosis not present

## 2020-11-21 DIAGNOSIS — G609 Hereditary and idiopathic neuropathy, unspecified: Secondary | ICD-10-CM | POA: Diagnosis not present

## 2020-11-21 DIAGNOSIS — I7300 Raynaud's syndrome without gangrene: Secondary | ICD-10-CM | POA: Diagnosis not present

## 2020-11-21 DIAGNOSIS — R270 Ataxia, unspecified: Secondary | ICD-10-CM | POA: Diagnosis not present

## 2020-11-21 DIAGNOSIS — R200 Anesthesia of skin: Secondary | ICD-10-CM | POA: Diagnosis not present

## 2020-11-24 DIAGNOSIS — R200 Anesthesia of skin: Secondary | ICD-10-CM | POA: Diagnosis not present

## 2020-11-24 DIAGNOSIS — M545 Low back pain, unspecified: Secondary | ICD-10-CM | POA: Diagnosis not present

## 2020-11-24 DIAGNOSIS — R270 Ataxia, unspecified: Secondary | ICD-10-CM | POA: Diagnosis not present

## 2020-11-24 DIAGNOSIS — G609 Hereditary and idiopathic neuropathy, unspecified: Secondary | ICD-10-CM | POA: Diagnosis not present

## 2020-11-24 DIAGNOSIS — I7300 Raynaud's syndrome without gangrene: Secondary | ICD-10-CM | POA: Diagnosis not present

## 2020-11-25 DIAGNOSIS — G609 Hereditary and idiopathic neuropathy, unspecified: Secondary | ICD-10-CM | POA: Diagnosis not present

## 2020-11-25 DIAGNOSIS — R200 Anesthesia of skin: Secondary | ICD-10-CM | POA: Diagnosis not present

## 2020-11-25 DIAGNOSIS — M545 Low back pain, unspecified: Secondary | ICD-10-CM | POA: Diagnosis not present

## 2020-11-25 DIAGNOSIS — I7300 Raynaud's syndrome without gangrene: Secondary | ICD-10-CM | POA: Diagnosis not present

## 2020-11-25 DIAGNOSIS — R270 Ataxia, unspecified: Secondary | ICD-10-CM | POA: Diagnosis not present

## 2020-11-28 DIAGNOSIS — I7300 Raynaud's syndrome without gangrene: Secondary | ICD-10-CM | POA: Diagnosis not present

## 2020-11-28 DIAGNOSIS — R270 Ataxia, unspecified: Secondary | ICD-10-CM | POA: Diagnosis not present

## 2020-11-28 DIAGNOSIS — R200 Anesthesia of skin: Secondary | ICD-10-CM | POA: Diagnosis not present

## 2020-11-28 DIAGNOSIS — G609 Hereditary and idiopathic neuropathy, unspecified: Secondary | ICD-10-CM | POA: Diagnosis not present

## 2020-11-28 DIAGNOSIS — M545 Low back pain, unspecified: Secondary | ICD-10-CM | POA: Diagnosis not present

## 2020-12-02 DIAGNOSIS — I7300 Raynaud's syndrome without gangrene: Secondary | ICD-10-CM | POA: Diagnosis not present

## 2020-12-02 DIAGNOSIS — G609 Hereditary and idiopathic neuropathy, unspecified: Secondary | ICD-10-CM | POA: Diagnosis not present

## 2020-12-02 DIAGNOSIS — R270 Ataxia, unspecified: Secondary | ICD-10-CM | POA: Diagnosis not present

## 2020-12-02 DIAGNOSIS — R200 Anesthesia of skin: Secondary | ICD-10-CM | POA: Diagnosis not present

## 2020-12-02 DIAGNOSIS — M545 Low back pain, unspecified: Secondary | ICD-10-CM | POA: Diagnosis not present

## 2020-12-04 DIAGNOSIS — R270 Ataxia, unspecified: Secondary | ICD-10-CM | POA: Diagnosis not present

## 2020-12-04 DIAGNOSIS — G609 Hereditary and idiopathic neuropathy, unspecified: Secondary | ICD-10-CM | POA: Diagnosis not present

## 2020-12-04 DIAGNOSIS — M545 Low back pain, unspecified: Secondary | ICD-10-CM | POA: Diagnosis not present

## 2020-12-04 DIAGNOSIS — R200 Anesthesia of skin: Secondary | ICD-10-CM | POA: Diagnosis not present

## 2020-12-04 DIAGNOSIS — I7300 Raynaud's syndrome without gangrene: Secondary | ICD-10-CM | POA: Diagnosis not present

## 2020-12-05 DIAGNOSIS — M545 Low back pain, unspecified: Secondary | ICD-10-CM | POA: Diagnosis not present

## 2020-12-05 DIAGNOSIS — I7300 Raynaud's syndrome without gangrene: Secondary | ICD-10-CM | POA: Diagnosis not present

## 2020-12-05 DIAGNOSIS — R200 Anesthesia of skin: Secondary | ICD-10-CM | POA: Diagnosis not present

## 2020-12-05 DIAGNOSIS — G609 Hereditary and idiopathic neuropathy, unspecified: Secondary | ICD-10-CM | POA: Diagnosis not present

## 2020-12-05 DIAGNOSIS — R270 Ataxia, unspecified: Secondary | ICD-10-CM | POA: Diagnosis not present

## 2020-12-08 DIAGNOSIS — R270 Ataxia, unspecified: Secondary | ICD-10-CM | POA: Diagnosis not present

## 2020-12-08 DIAGNOSIS — I7300 Raynaud's syndrome without gangrene: Secondary | ICD-10-CM | POA: Diagnosis not present

## 2020-12-08 DIAGNOSIS — G609 Hereditary and idiopathic neuropathy, unspecified: Secondary | ICD-10-CM | POA: Diagnosis not present

## 2020-12-08 DIAGNOSIS — R200 Anesthesia of skin: Secondary | ICD-10-CM | POA: Diagnosis not present

## 2020-12-08 DIAGNOSIS — M545 Low back pain, unspecified: Secondary | ICD-10-CM | POA: Diagnosis not present

## 2020-12-09 DIAGNOSIS — R270 Ataxia, unspecified: Secondary | ICD-10-CM | POA: Diagnosis not present

## 2020-12-09 DIAGNOSIS — G609 Hereditary and idiopathic neuropathy, unspecified: Secondary | ICD-10-CM | POA: Diagnosis not present

## 2020-12-09 DIAGNOSIS — M545 Low back pain, unspecified: Secondary | ICD-10-CM | POA: Diagnosis not present

## 2020-12-09 DIAGNOSIS — I7300 Raynaud's syndrome without gangrene: Secondary | ICD-10-CM | POA: Diagnosis not present

## 2020-12-09 DIAGNOSIS — R200 Anesthesia of skin: Secondary | ICD-10-CM | POA: Diagnosis not present

## 2020-12-12 DIAGNOSIS — R270 Ataxia, unspecified: Secondary | ICD-10-CM | POA: Diagnosis not present

## 2020-12-12 DIAGNOSIS — R200 Anesthesia of skin: Secondary | ICD-10-CM | POA: Diagnosis not present

## 2020-12-12 DIAGNOSIS — I7300 Raynaud's syndrome without gangrene: Secondary | ICD-10-CM | POA: Diagnosis not present

## 2020-12-12 DIAGNOSIS — G609 Hereditary and idiopathic neuropathy, unspecified: Secondary | ICD-10-CM | POA: Diagnosis not present

## 2020-12-12 DIAGNOSIS — M545 Low back pain, unspecified: Secondary | ICD-10-CM | POA: Diagnosis not present

## 2020-12-15 DIAGNOSIS — M545 Low back pain, unspecified: Secondary | ICD-10-CM | POA: Diagnosis not present

## 2020-12-15 DIAGNOSIS — I7300 Raynaud's syndrome without gangrene: Secondary | ICD-10-CM | POA: Diagnosis not present

## 2020-12-15 DIAGNOSIS — R200 Anesthesia of skin: Secondary | ICD-10-CM | POA: Diagnosis not present

## 2020-12-15 DIAGNOSIS — G609 Hereditary and idiopathic neuropathy, unspecified: Secondary | ICD-10-CM | POA: Diagnosis not present

## 2020-12-15 DIAGNOSIS — R270 Ataxia, unspecified: Secondary | ICD-10-CM | POA: Diagnosis not present

## 2020-12-17 DIAGNOSIS — R270 Ataxia, unspecified: Secondary | ICD-10-CM | POA: Diagnosis not present

## 2020-12-17 DIAGNOSIS — M545 Low back pain, unspecified: Secondary | ICD-10-CM | POA: Diagnosis not present

## 2020-12-17 DIAGNOSIS — I7300 Raynaud's syndrome without gangrene: Secondary | ICD-10-CM | POA: Diagnosis not present

## 2020-12-17 DIAGNOSIS — R200 Anesthesia of skin: Secondary | ICD-10-CM | POA: Diagnosis not present

## 2020-12-17 DIAGNOSIS — G609 Hereditary and idiopathic neuropathy, unspecified: Secondary | ICD-10-CM | POA: Diagnosis not present

## 2020-12-19 DIAGNOSIS — G609 Hereditary and idiopathic neuropathy, unspecified: Secondary | ICD-10-CM | POA: Diagnosis not present

## 2020-12-19 DIAGNOSIS — M545 Low back pain, unspecified: Secondary | ICD-10-CM | POA: Diagnosis not present

## 2020-12-19 DIAGNOSIS — R270 Ataxia, unspecified: Secondary | ICD-10-CM | POA: Diagnosis not present

## 2020-12-19 DIAGNOSIS — I7300 Raynaud's syndrome without gangrene: Secondary | ICD-10-CM | POA: Diagnosis not present

## 2020-12-19 DIAGNOSIS — R200 Anesthesia of skin: Secondary | ICD-10-CM | POA: Diagnosis not present

## 2021-01-09 DIAGNOSIS — Z86018 Personal history of other benign neoplasm: Secondary | ICD-10-CM | POA: Diagnosis not present

## 2021-01-09 DIAGNOSIS — D225 Melanocytic nevi of trunk: Secondary | ICD-10-CM | POA: Diagnosis not present

## 2021-01-09 DIAGNOSIS — L578 Other skin changes due to chronic exposure to nonionizing radiation: Secondary | ICD-10-CM | POA: Diagnosis not present

## 2021-01-09 DIAGNOSIS — L570 Actinic keratosis: Secondary | ICD-10-CM | POA: Diagnosis not present

## 2021-01-09 DIAGNOSIS — Z85828 Personal history of other malignant neoplasm of skin: Secondary | ICD-10-CM | POA: Diagnosis not present

## 2021-01-09 DIAGNOSIS — D485 Neoplasm of uncertain behavior of skin: Secondary | ICD-10-CM | POA: Diagnosis not present

## 2021-01-09 DIAGNOSIS — L821 Other seborrheic keratosis: Secondary | ICD-10-CM | POA: Diagnosis not present

## 2021-01-15 DIAGNOSIS — E782 Mixed hyperlipidemia: Secondary | ICD-10-CM | POA: Diagnosis not present

## 2021-01-15 DIAGNOSIS — C61 Malignant neoplasm of prostate: Secondary | ICD-10-CM | POA: Diagnosis not present

## 2021-01-15 DIAGNOSIS — E7800 Pure hypercholesterolemia, unspecified: Secondary | ICD-10-CM | POA: Diagnosis not present

## 2021-01-15 DIAGNOSIS — K219 Gastro-esophageal reflux disease without esophagitis: Secondary | ICD-10-CM | POA: Diagnosis not present

## 2021-01-15 DIAGNOSIS — G43109 Migraine with aura, not intractable, without status migrainosus: Secondary | ICD-10-CM | POA: Diagnosis not present

## 2021-01-21 ENCOUNTER — Encounter: Payer: Self-pay | Admitting: Adult Health

## 2021-01-21 ENCOUNTER — Ambulatory Visit: Payer: PPO | Admitting: Adult Health

## 2021-01-21 VITALS — BP 131/74 | HR 76 | Ht 70.0 in | Wt 189.0 lb

## 2021-01-21 DIAGNOSIS — G4733 Obstructive sleep apnea (adult) (pediatric): Secondary | ICD-10-CM | POA: Diagnosis not present

## 2021-01-21 DIAGNOSIS — R2689 Other abnormalities of gait and mobility: Secondary | ICD-10-CM

## 2021-01-21 DIAGNOSIS — G43809 Other migraine, not intractable, without status migrainosus: Secondary | ICD-10-CM | POA: Diagnosis not present

## 2021-01-21 DIAGNOSIS — Z9989 Dependence on other enabling machines and devices: Secondary | ICD-10-CM | POA: Diagnosis not present

## 2021-01-21 NOTE — Patient Instructions (Signed)
Your Plan:  Continue using CPAP nightly  Increase pressure 10 cmH20 If your symptoms worsen or you develop new symptoms please let us know.   Thank you for coming to see [place] at [facility]. I hope we have been able to provide you high quality care today.  You may receive a patient satisfaction survey over the next few weeks. We would appreciate your feedback and comments so that we may continue to improve ourselves and the health of our patients.

## 2021-01-21 NOTE — Progress Notes (Addendum)
PATIENT: Andrew Hayes DOB: [birth year]  REASON FOR VISIT: follow up HISTORY FROM: patient  HISTORY OF PRESENT ILLNESS: Today 01/21/21:  Andrew Hayes is a 80 year old male with a history of OSA on CPAP and vestibular migraines. Reports CPAP works well for him. No issues.   Migraines: since shunt was turned off not having migraines.   Vestibular migraines: reports that he has not had a severe episode. Occasionally will have some dizziness but episode does not last long.  Balance issues: patient continues to report balance issues. No falls. Feels like his feet are sponges. Has tried PT. Feels that he has neuropathy. Denies numbness and tingling. Has had NCV/EMG in 2016 that was normal.     HISTORY:   02/05/20:  Andrew Hayes is a 80 year old male with a history of vestibular migraines and normal pressure hydrocephalus post shunting.  He saw Dr. [name] last week and his shunt was adjusted from her drainage.  He reports that since then his headaches have increased.  He states that he has a headache every day typically on the right side of the head in the frontal region.  He reports the worst his pain is been is a 7-8/10.  Currently it is 4 out of 10.  He reports that he has been taking extra strength Tylenol on a daily basis.  Reports that his headache does improve within 30 to 40 minutes if he lays down.  He reports with activity the headache seems to come back.  He reports initially after having the shunt if he coughed had a bowel movement or lifting object he felt more pressure in the brain.  He reports that his hearing has also worsened on the right side.  He reports that he is having mild dizziness with his headaches.  With more severe dizziness he does have nausea and vomiting.  Denies photophobia and phonophobia.  He returns today for an evaluation.  REVIEW OF SYSTEMS: Out of a complete 14 system review of symptoms, the patient complains only of the following symptoms, and  all other reviewed systems are negative.  FSS  16 ESS 4  ALLERGIES: Allergies  Allergen Reactions  . Percocet [Oxycodone-Acetaminophen] Other (See Comments)    Patient states he is unable to tolerate    HOME MEDICATIONS: Outpatient Medications Prior to Visit  Medication Sig Dispense Refill  . Acetaminophen (TYLENOL PO) Take by mouth.    Marland Kitchen atorvastatin (LIPITOR) 10 MG tablet Take 10 mg by mouth daily.     . meclizine (ANTIVERT) 25 MG tablet Take 1 tablet (25 mg total) by mouth every 6 (six) hours as needed for dizziness or nausea. 30 tablet 6  . rizatriptan (MAXALT-MLT) 10 MG disintegrating tablet Take 1 tablet (10 mg total) by mouth as needed for migraine. May repeat in 2 hours if needed 10 tablet 11   No facility-administered medications prior to visit.    PAST MEDICAL HISTORY: Past Medical History:  Diagnosis Date  . Actinic keratoses   . Adenomatous polyp of colon 2006  . Arthritis   . Diverticulosis    Mild  . Elevated fasting glucose    Mildly  . Herniated disc   . Junctional nevus   . Lipidemia    Mixed  . Lyme disease 2020   pt placed on abx and stated he stopped taking vitamins so as not to interfere.  . Mild sleep apnea    CPAP  . Organic impotence   . Plantar fasciitis   .  Pneumonia 1966  . Prostate cancer ([place])   . Vestibular migraine     PAST SURGICAL HISTORY: Past Surgical History:  Procedure Laterality Date  . APPENDECTOMY    . COLONOSCOPY    . ELBOW SURGERY Left 2013  . KNEE ARTHROSCOPY Bilateral 2013   knee and elbow  . PLACEMENT OF LUMBAR DRAIN N/A 09/17/2019   Procedure: PLACEMENT OF LUMBAR DRAIN;  Surgeon: Judith Part, MD;  Location: [facility];  Service: Neurosurgery;  Laterality: N/A;  Lumbar drain placement  . PROSTATECTOMY  1986  . TONSILLECTOMY    . VENTRICULOPERITONEAL SHUNT Right 01/07/2020   Procedure: Right ventriculoperitoneal shunt placement;  Surgeon: Judith Part, MD;  Location: [facility];  Service: Neurosurgery;   Laterality: Right;    FAMILY HISTORY: Family History  Problem Relation Age of Onset  . Stroke Mother   . Hypertension Mother   . Parkinson's disease Mother   . COPD Father   . Dementia Brother   . Hydrocephalus Brother   . Parkinson's disease Brother   . Parkinson's disease Sister   . Congestive Heart Failure Sister   . Heart attack Maternal Grandfather        50's  . Other Paternal Grandmother        chilbirth  . Heart attack Paternal Grandfather        79's  . Heart disease Daughter        deceased at 76 month  . Colon cancer Neg Hx   . Migraines Neg Hx        none to his knowledge    SOCIAL HISTORY: Social History   Socioeconomic History  . Marital status: Married    Spouse name: Andrew Hayes  . Number of children: 2  . Years of education: 51  . Highest education level: Not on file  Occupational History  . Occupation: Manufacturing engineer: RETIRED    Comment: retired  Tobacco Use  . Smoking status: Former Smoker    Packs/day: 2.00    Years: 24.00    Pack years: 48.00    Types: Cigarettes    Quit date: 10/11/1984    Years since quitting: 36.3  . Smokeless tobacco: Never Used  . Tobacco comment: briefly used smokeless tobacco  Vaping Use  . Vaping Use: Never used  Substance and Sexual Activity  . Alcohol use: Yes    Alcohol/week: 7.0 standard drinks    Types: 7 Standard drinks or equivalent per week    Comment: 1 per day  . Drug use: No  . Sexual activity: Not on file  Other Topics Concern  . Not on file  Social History Narrative   Patient lives at home with family.   Patient consumes about 2 cups of caffeine daily,   right handed.   2 children (1 deceased)   Social Determinants of Radio broadcast assistant Strain: Not on file  Food Insecurity: Not on file  Transportation Needs: Not on file  Physical Activity: Not on file  Stress: Not on file  Social Connections: Not on file  Intimate Partner Violence: Not on file      PHYSICAL EXAM  Vitals:    01/21/21 1457  BP: 131/74  Pulse: 76  Weight: 189 lb (85.7 kg)  Height: 5\' 10"  (1.778 m)   Body mass index is 27.12 kg/m.  Generalized: Well developed, in no acute distress  Chest: Lungs clear to auscultation bilaterally  Neurological examination  Mentation: Alert oriented to time, place, history  taking. Follows all commands speech and language fluent Cranial nerve II-XII: Extraocular movements were full, visual field were full on confrontational test Head turning and shoulder shrug  were normal and symmetric. Motor: The motor testing reveals 5 over 5 strength of all 4 extremities. Good symmetric motor tone is noted throughout.  Sensory: Sensory testing is intact to soft touch on all 4 extremities. No evidence of extinction is noted.  Gait and station: Gait is normal.    DIAGNOSTIC DATA (LABS, IMAGING, TESTING) - I reviewed patient records, labs, notes, testing and imaging myself where available.  Lab Results  Component Value Date   WBC 5.5 06/23/2020   HGB 15.1 06/23/2020   HCT 44.1 06/23/2020   MCV 96.1 06/23/2020   PLT 230.0 06/23/2020      Component Value Date/Time   NA 140 06/23/2020 0936   NA 143 05/21/2019 0940   K 4.0 06/23/2020 0936   CL 105 06/23/2020 0936   CO2 25 06/23/2020 0936   GLUCOSE 104 (H) 06/23/2020 0936   BUN 21 06/23/2020 0936   BUN 21 05/21/2019 0940   CREATININE 1.05 06/23/2020 0936   CALCIUM 9.4 06/23/2020 0936   PROT 7.3 06/23/2020 0936   ALBUMIN 4.7 06/23/2020 0936   AST 25 06/23/2020 0936   ALT 20 06/23/2020 0936   ALKPHOS 84 06/23/2020 0936   BILITOT 2.1 (H) 07/09/2020 1332   GFRNONAA >60 01/07/2020 1932   GFRAA >60 01/07/2020 1932    Lab Results  Component Value Date   VITAMINB12 1,197 08/15/2019   Lab Results  Component Value Date   TSH 2.010 08/15/2019      ASSESSMENT AND PLAN 80 y.o. year old male  has a past medical history of Actinic keratoses, Adenomatous polyp of colon (2006), Arthritis, Diverticulosis, Elevated  fasting glucose, Herniated disc, Junctional nevus, Lipidemia, Lyme disease (2020), Mild sleep apnea, Organic impotence, Plantar fasciitis, Pneumonia (1966), Prostate cancer ([place]), and Vestibular migraine. here with:  1. OSA on CPAP  - CPAP compliance excellent - Good treatment of AHI  - Encourage patient to use CPAP nightly and > 4 hours each night  2. Vestibular migraines  -- stable currently   3. Balance issues  -- has had a through work-up. Dicussed getting a second opinion? Advised he could discuss with his PCP   - F/U in 1 year or sooner if needed    Ward Givens, MSN, NP-C 01/21/2021, 3:01 PM [facility] [address], [place], [place] [address] [phone]  Made any corrections needed, and agree with history, physical, neuro exam,assessment and plan as stated.     Sarina Ill, MD [facility]

## 2021-01-27 ENCOUNTER — Telehealth: Payer: Self-pay | Admitting: Adult Health

## 2021-01-27 DIAGNOSIS — Z9989 Dependence on other enabling machines and devices: Secondary | ICD-10-CM

## 2021-01-27 DIAGNOSIS — G4733 Obstructive sleep apnea (adult) (pediatric): Secondary | ICD-10-CM

## 2021-01-27 NOTE — Telephone Encounter (Signed)
I called pt and relayed that after speaking to glenda, she said that resmed tech stated that cell phone towers are being replaced from 3G to 5G.  It will be done randomly as not all cell phone carriers on same page.  resmed machines from 2014-2016 and some 2017 will be affected.  This will require those with these machines to have SD card inplace to get cpap download information and pts will not be able to look at information remotely as before.  S9 and airsense 10 involved.  If questions to call us back or DME.  Will need to get SD card from DME.

## 2021-01-27 NOTE — Telephone Encounter (Signed)
I called pt.  He states that pressure changed to 10cmH20.  He has not been able to see data since 01-22-21.  I called and spoke to [facility] with [employer].  She does see the change.  Will call resmed to look into remotely and let me know.

## 2021-01-27 NOTE — Telephone Encounter (Signed)
Pt is asking for a call re: the change on the settings of his CPAP

## 2021-02-05 NOTE — Telephone Encounter (Signed)
Please let patient know that he will have to repeat a home sleep test before we can order a new machine if he is amenable I can place order for home sleep test

## 2021-02-05 NOTE — Telephone Encounter (Signed)
Spoke to pt and he states he is eligible for new cpap machine per DME.  Would like an order to proceed to get new one and then will be able to look at his cpap sleep information (5G).  I will forward this and order to sign to MM/NP for review.  He appreciated call.

## 2021-02-05 NOTE — Addendum Note (Signed)
Addended by: Brandon Melnick on: 02/05/2021 09:55 AM   Modules accepted: Orders

## 2021-02-05 NOTE — Telephone Encounter (Signed)
Pt called, having trouble with my CPAP machine transmitting data. The company told me I will need a new CPAP machine. Would like a call from the nurse. Please call me on my cellphone [phone]

## 2021-02-05 NOTE — Addendum Note (Signed)
Addended by: Brandon Melnick on: 02/05/2021 10:49 AM   Modules accepted: Orders

## 2021-02-05 NOTE — Addendum Note (Signed)
Addended by: Trudie Buckler on: 02/05/2021 07:18 PM   Modules accepted: Orders

## 2021-02-05 NOTE — Telephone Encounter (Signed)
Spoke to pt and is amenable to having this done.  Order for HST started for MM/NP

## 2021-02-25 DIAGNOSIS — R2689 Other abnormalities of gait and mobility: Secondary | ICD-10-CM | POA: Diagnosis not present

## 2021-02-25 DIAGNOSIS — R7301 Impaired fasting glucose: Secondary | ICD-10-CM | POA: Diagnosis not present

## 2021-02-25 DIAGNOSIS — G629 Polyneuropathy, unspecified: Secondary | ICD-10-CM | POA: Diagnosis not present

## 2021-03-16 ENCOUNTER — Ambulatory Visit (INDEPENDENT_AMBULATORY_CARE_PROVIDER_SITE_OTHER): Payer: PPO | Admitting: Neurology

## 2021-03-16 DIAGNOSIS — G4733 Obstructive sleep apnea (adult) (pediatric): Secondary | ICD-10-CM | POA: Diagnosis not present

## 2021-03-17 ENCOUNTER — Telehealth: Payer: Self-pay | Admitting: Neurology

## 2021-03-17 DIAGNOSIS — G4733 Obstructive sleep apnea (adult) (pediatric): Secondary | ICD-10-CM

## 2021-03-17 NOTE — Progress Notes (Signed)
See pprocure note.

## 2021-03-17 NOTE — Telephone Encounter (Signed)
Patient was last seen by MM on 01/21/21. He qualified for a new PAP machine and had a HST for re-eval on 03/16/21.  Please call and notify the patient that the recent home sleep test showed obstructive sleep apnea in the moderate range. I have placed an order for a new autoPAP machine. We will send the order to his DME company. Once he starts treatment, he will need a FU in 31-89 days. He has an appt with Jinny Blossom next year, but will likely need an appt in between, after he starts using his new equipment.   Star Age, MD, PhD [facility])

## 2021-03-17 NOTE — Procedures (Signed)
[facility] ([facility]) REPORT  STUDY DATE: 03-16-21  DOB: 18-Sep-1941  MRN: [id number]  ORDERING CLINICIAN: Ward Givens, NP   REFERRING CLINICIAN: [name], MD   CLINICAL INFORMATION/HISTORY: 80 year old male with a history of OSA on CPAP and vestibular migraines. He presents for re-evaluation of his OSA.   Epworth sleepiness score: N/A.  BMI: 27.1 kg/m  Neck Circumference: N/A  FINDINGS:   Sleep Summary:   Total Recording Time (hours, min): 7 H 5 min  Total Sleep Time (hours, min):  5 H 49 min   Percent REM (%):    11.16 %   Respiratory Indices:   Calculated pAHI (per hour): 26.6/hour        REM pAHI: 44.9/hour      NREM pAHI: 24.3/hour Supine AHI: 34.8 Oxygen Saturation Statistics:    Oxygen Saturation (%) Mean: 94%  Minimum oxygen saturation (%):  84%  O2 Saturation Range (%):84-98%   O2 Saturation (minutes) <=88%: 0 min  Pulse Rate Statistics:   Pulse Mean (bpm): 59/min   Pulse Range (51-93/min)   IMPRESSION: OSA (obstructive sleep apnea)  RECOMMENDATION:  This home sleep test demonstrates moderate obstructive sleep apnea with a total AHI of 26.6/hour and O2 nadir of 84%. Mild to moderate snoring was detected. Ongoing treatment with positive airway pressure is recommended. The patient will be advised to proceed with an autoPAP titration/trial at home for now. A full night titration study may be considered to optimize treatment settings, if needed down the road. Please note that untreated obstructive sleep apnea may carry additional perioperative morbidity. Patients with significant obstructive sleep apnea should receive perioperative PAP therapy and the surgeons and particularly the anesthesiologist should be informed of the diagnosis and the severity of the sleep disordered breathing. The patient should be cautioned not to drive, work at heights, or operate dangerous or heavy equipment when tired or sleepy. Review  and reiteration of good sleep hygiene measures should be pursued with any patient. Other causes of the patient's symptoms, including circadian rhythm disturbances, an underlying mood disorder, medication effect and/or an underlying medical problem cannot be ruled out based on this test. Clinical correlation is recommended. The patient and his referring provider will be notified of the test results. The patient will be seen in follow up in sleep clinic at [facility].  I certify that I have reviewed the raw data recording prior to the issuance of this report in accordance with the standards of the American Academy of Sleep Medicine (AASM).  INTERPRETING PHYSICIAN:   Star Age, MD, PhD  Board Certified in Neurology and Sleep Medicine  [facility] [address], [place],  [address] [phone]  Sleep Summary  Oxygen Saturation Statistics   Start Study Time: End Study Time: Total Recording Time:      11:06:30 PM 6:12:04 AM 7 h, 5 min  Total Sleep Time % REM of Sleep Time:  5 h, 49 min  11.2    Mean: 94 Minimum: 84 Maximum: 98  Mean of Desaturations Nadirs (%):   92  Oxygen Desatur. %:  4-9 10-20 >20 Total  Events Number Total   61  2 96.8 3.2  0 0.0  63 100.0  Oxygen Saturation: <90 <=88 <85 <80 <70  Duration (minutes): Sleep % 0.1 0.0 0.0 0.0 0.0 0.0 0.0 0.0 0.0 0.0     Respiratory Indices      Total Events REM NREM All Night  pRDI: pAHI 3%: ODI 4%: pAHIc 3%: % CSR: pAHI 4%:  154 153   63  11 0.0 67 44.9 44.9 32.5 6.4 24.5 24.3 8.2 1.5 26.8 26.6 10.9 2.1 0 11.6       Pulse Rate Statistics during Sleep (BPM)      Mean: 59 Minimum: 51 Maximum: 93         Body Position Statistics  Position Supine Prone Right Left Non-Supine  Sleep (min) 220.0 0.0 0.0 128.5 128.5  Sleep % 62.9 0.0 0.0 36.8 36.8  pRDI 34.8 N/A N/A 13.2 13.2  pAHI 3% 34.8 N/A N/A 12.8 12.8  ODI 4% 15.2 N/A N/A 3.8 3.8     Snoring  Statistics Snoring Level (dB) >40 >50 >60 >70 >80 >Threshold (45)  Sleep (min) 288.2 9.7 4.3 0.0 0.0 18.4  Sleep % 82.5 2.8 1.2 0.0 0.0 5.3    Mean: 42 dB Sleep Stages Chart

## 2021-03-18 NOTE — Telephone Encounter (Signed)
Left message for patient to return call but also sent a email message relating to sleep results from most recent home sleep test.

## 2021-03-18 NOTE — Telephone Encounter (Signed)
Marchelle Gearing [employer] to Brandon Melnick, RN got it!   Thanks

## 2021-03-19 NOTE — Telephone Encounter (Signed)
I called patient. I wanted to follow-up on MyChart message in regards to his home sleep test results and recommendations per Dr. Rexene Alberts.  He did receive this .  He did speak to someone at [employer] they told him it was 12 weeks out to get a new machine.  He does have a card in his machine and that can be read by [place] or the DME company by taking it to their facility.  He appreciated call back. I did relate that when he gets his new machine we would need to do a appointment 31 to 89 days for compliance relating to his insurance (very important to keep this appt).  He verbalized understanding.  He will call us when receives.

## 2021-05-09 ENCOUNTER — Encounter: Payer: Self-pay | Admitting: Gastroenterology

## 2021-05-15 DIAGNOSIS — E7800 Pure hypercholesterolemia, unspecified: Secondary | ICD-10-CM | POA: Diagnosis not present

## 2021-05-15 DIAGNOSIS — K219 Gastro-esophageal reflux disease without esophagitis: Secondary | ICD-10-CM | POA: Diagnosis not present

## 2021-05-15 DIAGNOSIS — G43109 Migraine with aura, not intractable, without status migrainosus: Secondary | ICD-10-CM | POA: Diagnosis not present

## 2021-05-18 DIAGNOSIS — R899 Unspecified abnormal finding in specimens from other organs, systems and tissues: Secondary | ICD-10-CM | POA: Diagnosis not present

## 2021-05-18 DIAGNOSIS — Z8546 Personal history of malignant neoplasm of prostate: Secondary | ICD-10-CM | POA: Diagnosis not present

## 2021-05-18 DIAGNOSIS — R7303 Prediabetes: Secondary | ICD-10-CM | POA: Diagnosis not present

## 2021-05-18 DIAGNOSIS — E7800 Pure hypercholesterolemia, unspecified: Secondary | ICD-10-CM | POA: Diagnosis not present

## 2021-05-22 DIAGNOSIS — Z8546 Personal history of malignant neoplasm of prostate: Secondary | ICD-10-CM | POA: Diagnosis not present

## 2021-05-22 DIAGNOSIS — Z0000 Encounter for general adult medical examination without abnormal findings: Secondary | ICD-10-CM | POA: Diagnosis not present

## 2021-05-22 DIAGNOSIS — E7800 Pure hypercholesterolemia, unspecified: Secondary | ICD-10-CM | POA: Diagnosis not present

## 2021-05-22 DIAGNOSIS — R7303 Prediabetes: Secondary | ICD-10-CM | POA: Diagnosis not present

## 2021-05-22 DIAGNOSIS — Z982 Presence of cerebrospinal fluid drainage device: Secondary | ICD-10-CM | POA: Diagnosis not present

## 2021-05-27 DIAGNOSIS — G4733 Obstructive sleep apnea (adult) (pediatric): Secondary | ICD-10-CM | POA: Diagnosis not present

## 2021-06-27 DIAGNOSIS — G4733 Obstructive sleep apnea (adult) (pediatric): Secondary | ICD-10-CM | POA: Diagnosis not present

## 2021-07-27 DIAGNOSIS — G4733 Obstructive sleep apnea (adult) (pediatric): Secondary | ICD-10-CM | POA: Diagnosis not present

## 2021-08-16 DIAGNOSIS — R509 Fever, unspecified: Secondary | ICD-10-CM | POA: Diagnosis not present

## 2021-08-16 DIAGNOSIS — R051 Acute cough: Secondary | ICD-10-CM | POA: Diagnosis not present

## 2021-08-16 DIAGNOSIS — U071 COVID-19: Secondary | ICD-10-CM | POA: Diagnosis not present

## 2021-08-16 DIAGNOSIS — R059 Cough, unspecified: Secondary | ICD-10-CM | POA: Diagnosis not present

## 2021-08-17 DIAGNOSIS — K219 Gastro-esophageal reflux disease without esophagitis: Secondary | ICD-10-CM | POA: Diagnosis not present

## 2021-08-17 DIAGNOSIS — E785 Hyperlipidemia, unspecified: Secondary | ICD-10-CM | POA: Diagnosis not present

## 2021-08-17 DIAGNOSIS — G43109 Migraine with aura, not intractable, without status migrainosus: Secondary | ICD-10-CM | POA: Diagnosis not present

## 2021-08-17 DIAGNOSIS — E7800 Pure hypercholesterolemia, unspecified: Secondary | ICD-10-CM | POA: Diagnosis not present

## 2021-08-26 DIAGNOSIS — G4733 Obstructive sleep apnea (adult) (pediatric): Secondary | ICD-10-CM | POA: Diagnosis not present

## 2021-09-11 DIAGNOSIS — Z6825 Body mass index (BMI) 25.0-25.9, adult: Secondary | ICD-10-CM | POA: Diagnosis not present

## 2021-09-11 DIAGNOSIS — G912 (Idiopathic) normal pressure hydrocephalus: Secondary | ICD-10-CM | POA: Diagnosis not present

## 2021-09-11 DIAGNOSIS — R030 Elevated blood-pressure reading, without diagnosis of hypertension: Secondary | ICD-10-CM | POA: Diagnosis not present

## 2021-10-11 HISTORY — PX: OTHER SURGICAL HISTORY: SHX169

## 2021-10-12 NOTE — Progress Notes (Deleted)
PATIENT: Andrew Hayes DOB: 1941/08/18  REASON FOR VISIT: follow up HISTORY FROM: patient  HISTORY OF PRESENT ILLNESS: Today 10/12/21:  Mr. Favia is a 81 year old male with a history of vestibular migraines and normal pressure hydrocephalus post shunting.  He saw Dr. [name] last week and his shunt was adjusted from her drainage.  He reports that since then his headaches have increased.  He states that he has a headache every day typically on the right side of the head in the frontal region.  He reports the worst his pain is been is a 7-8/10.  Currently it is 4 out of 10.  He reports that he has been taking extra strength Tylenol on a daily basis.  Reports that his headache does improve within 30 to 40 minutes if he lays down.  He reports with activity the headache seems to come back.  He reports initially after having the shunt if he coughed had a bowel movement or lifting object he felt more pressure in the brain.  He reports that his hearing has also worsened on the right side.  He reports that he is having mild dizziness with his headaches.  With more severe dizziness he does have nausea and vomiting.  Denies photophobia and phonophobia.  He returns today for an evaluation.  HISTORY Vestibular migraine and hydrocephalus normal pressure status post shunting   Interval history 01/16/2020: He had the shunting but he developed a headache. The headache is more on the right side behind the eye socket and is throbbing but just an ache, just irritating now but it did get to 6-7/10. He started using his cpap again. He feels improved with his vertigo and headache. We can try nurtec and a nerve block, will not send to infusion as he looks completely comfortable. He is having more of his vestibular episodes, we discussed trying acute management medications and if it gets worse or more frequent trying a preventative. He used to go once a year or every 6 months, now its more monthly but not as  severe. So we will try nurtec. He will follow up with Dr. [name] to change the shunt settings.    Performed by Dr. [name] M.D. All procedures a documented blood were medically necessary, reasonable and appropriate based on the patient's history, medical diagnosis and physician opinion. Verbal informed consent was obtained from the patient, patient was informed of potential risk of procedure, including bruising, bleeding, hematoma formation, infection, muscle weakness, muscle pain, numbness, transient hypertension, transient hyperglycemia and transient insomnia among others. All areas injected were topically clean with isopropyl rubbing alcohol. Nonsterile nonlatex gloves were worn during the procedure.   1. Greater occipital nerve block [phone]). The greater occipital nerve site was identified at the nuchal line medial to the occipital artery. Medication was injected into the  right occipital nerve areas and suboccipital areas. Patient's condition is associated with inflammation of the greater occipital nerve and associated multiple groups. Injection was deemed medically necessary, reasonable and appropriate. Injection represents a separate and unique surgical service.   2. Supraorbital nerve block ([id number]): Supraorbital nerve site was identified along the incision of the frontal bone on the orbital/supraorbital ridge. Medication was injected into the right supraorbital nerve areas. Patient's condition is associated with inflammation of the supraorbital and associated muscle groups. Injection was deemed medically necessary, reasonable and appropriate. Injection represents a separate and unique surgical service.     Interval history January 15, 2020: Patient has undergone lumbar drain the  last several days, he was discharged from the hospital just 2 days ago, I did discuss his case with Dr. [name] who stated he had a headache in the hospital and adjusted his drain, patients here for headache.      Interval history 10/16/2019: Patient for follow up after lumbar drain placement. Patient feels as though he improved as far as his balance, he definitely improved. It was not "drastic" but it was signigifant. I reminded him we are catching this early and so his improvement may not be as drastic as someone we diagnose in the later stages of NPH. Also discuss his episodes of dizziness may also be due to possible NPH ve vestibular migraines. We discussed this at length and he is going to see Dr [name].   Interval history 08/15/2019: Patient with episodes of dizziness unclear etiology. Possibly central associated with migraines. He has had 2 episodes and treated with migraine IV infusion of Depacon. We discussed migraine preventative, he says meats with nitrates cause a lot of problems and triggers episodes, He doesn't have a problem with bacon but feels lunch meat affects him particularly salami. He does not have these often. 3 years ago he was having them every few months, now he has had a few more. He declines preventative, he will try to take it sooner right at the onset he will take rizatriptan. We also discussed his MRI today, reviewed images, he has some memory problems, he feels his balance is poor. His brother has NPH. He feels his memory is impaired. Reviewed MRI images with him, showed him very large ventricles.   Interval history: Patient started experiencing vestibular symptoms several days ago. 2 days ago it put him in bed. Unclear if vestibular migraines or other etiology. Discussed we should take him to the IV infusion lab and treat him like a migraine and if symptoms resolve then this is a likely indication this is central in origin and not due to canaliths for example, I suspect vestibular migraines, discussed with patient and agreed on treatment. In the furute he will call then symptoms starts for 1g depacon, today his symptoms resolved afte rtreatment which included a fogginess, dizzines, not  feeling right in the head, imbalance.   HPI:  Andrew Hayes is a 81 y.o. male here as requested by [name], MD for dizziness. He was 81 years old with the first episode of vertigo, he has to lay down, a feeling of spinning and if he doesn't lay down perfectly flat keep his head on the bed he gets nauseated. Worst one was 4 years ago he was in bed for days and every time he got up he vomited. He has been evaluated by [facility] and multiple ENTs and diagnosed with vestibular migraines. He will episodes are a little dizziness, he takes rizatriptan and it does get very bad after that, but he has a "hangover" and may not feel good for a few days but he can avoid the severe vertigo and vomiting. He is getting these "little" episodes more frequently 2x a month that last for 4-5 days. No inciting events, he has evaluated all possible triggers and none found. He even tried a migraine diet for 3-4 months recommended by [facility] and didn;t help. He never gets headaches with the episodes. He has been to vestibular therapy. He drinks enough water, he has had brain MRIs, He is having more frequent episodes, he feels his balance is impaired and sometimes he does not walk well, hearing  changes. No Fhx of migraines.   Reviewed notes, labs and imaging from outside physicians, which showed:   IMPRESSION: MRI brain 2016 personally reviewed images 1. No imaging explanation for vestibular cochlear symptoms, as above. 2. Ventriculomegaly. Are there any symptoms of normal pressure hydrocephalus  REVIEW OF SYSTEMS: Out of a complete 14 system review of symptoms, the patient complains only of the following symptoms, and all other reviewed systems are negative.  See HPI  ALLERGIES: Allergies  Allergen Reactions   Percocet [Oxycodone-Acetaminophen] Other (See Comments)    Patient states he is unable to tolerate    HOME MEDICATIONS: Outpatient Medications Prior to Visit  Medication Sig Dispense Refill   Acetaminophen  (TYLENOL PO) Take by mouth.     atorvastatin (LIPITOR) 10 MG tablet Take 10 mg by mouth daily.      meclizine (ANTIVERT) 25 MG tablet Take 1 tablet (25 mg total) by mouth every 6 (six) hours as needed for dizziness or nausea. 30 tablet 6   rizatriptan (MAXALT-MLT) 10 MG disintegrating tablet Take 1 tablet (10 mg total) by mouth as needed for migraine. May repeat in 2 hours if needed 10 tablet 11   No facility-administered medications prior to visit.    PAST MEDICAL HISTORY: Past Medical History:  Diagnosis Date   Actinic keratoses    Adenomatous polyp of colon 2006   Arthritis    Diverticulosis    Mild   Elevated fasting glucose    Mildly   Herniated disc    Junctional nevus    Lipidemia    Mixed   Lyme disease 2020   pt placed on abx and stated he stopped taking vitamins so as not to interfere.   Mild sleep apnea    CPAP   Organic impotence    Plantar fasciitis    Pneumonia 1966   Prostate cancer [facility])    Vestibular migraine     PAST SURGICAL HISTORY: Past Surgical History:  Procedure Laterality Date   APPENDECTOMY     COLONOSCOPY     ELBOW SURGERY Left 2013   KNEE ARTHROSCOPY Bilateral 2013   knee and elbow   PLACEMENT OF LUMBAR DRAIN N/A 09/17/2019   Procedure: PLACEMENT OF LUMBAR DRAIN;  Surgeon: Judith Part, MD;  Location: [facility];  Service: Neurosurgery;  Laterality: N/A;  Lumbar drain placement   PROSTATECTOMY  1986   TONSILLECTOMY     VENTRICULOPERITONEAL SHUNT Right 01/07/2020   Procedure: Right ventriculoperitoneal shunt placement;  Surgeon: Judith Part, MD;  Location: [facility];  Service: Neurosurgery;  Laterality: Right;    FAMILY HISTORY: Family History  Problem Relation Age of Onset   Stroke Mother    Hypertension Mother    Parkinson's disease Mother    COPD Father    Dementia Brother    Hydrocephalus Brother    Parkinson's disease Brother    Parkinson's disease Sister    Congestive Heart Failure Sister    Heart attack Maternal  Grandfather        46's   Other Paternal Grandmother        chilbirth   Heart attack Paternal Grandfather        44's   Heart disease Daughter        deceased at 54 month   Colon cancer Neg Hx    Migraines Neg Hx        none to his knowledge    SOCIAL HISTORY: Social History   Socioeconomic History   Marital status: Married  Spouse name: Hassan Rowan   Number of children: 2   Years of education: 16   Highest education level: Not on file  Occupational History   Occupation: Manufacturing engineer: RETIRED    Comment: retired  Tobacco Use   Smoking status: Former    Packs/day: 2.00    Years: 24.00    Pack years: 48.00    Types: Cigarettes    Quit date: 10/11/1984    Years since quitting: 37.0   Smokeless tobacco: Never   Tobacco comments:    briefly used smokeless tobacco  Vaping Use   Vaping Use: Never used  Substance and Sexual Activity   Alcohol use: Yes    Alcohol/week: 7.0 standard drinks    Types: 7 Standard drinks or equivalent per week    Comment: 1 per day   Drug use: No   Sexual activity: Not on file  Other Topics Concern   Not on file  Social History Narrative   Patient lives at home with family.   Patient consumes about 2 cups of caffeine daily,   right handed.   2 children (1 deceased)   Social Determinants of Health   Financial Resource Strain: Not on file  Food Insecurity: Not on file  Transportation Needs: Not on file  Physical Activity: Not on file  Stress: Not on file  Social Connections: Not on file  Intimate Partner Violence: Not on file      PHYSICAL EXAM  There were no vitals filed for this visit.  There is no height or weight on file to calculate BMI.  Generalized: Well developed, in no acute distress   Neurological examination  Mentation: Alert oriented to time, place, history taking. Follows all commands speech and language fluent Cranial nerve II-XII: Pupils were equal round reactive to light. Extraocular movements were full,  visual field were full on confrontational test. Facial sensation and strength were normal. Uvula tongue midline. Head turning and shoulder shrug  were normal and symmetric. Motor: The motor testing reveals 5 over 5 strength of all 4 extremities. Good symmetric motor tone is noted throughout.  Sensory: Sensory testing is intact to soft touch on all 4 extremities. No evidence of extinction is noted.  Coordination: Cerebellar testing reveals good finger-nose-finger and heel-to-shin bilaterally.  Gait and station: Gait is normal. Tandem gait is normal. Romberg is negative. No drift is seen.  Reflexes: Deep tendon reflexes are symmetric and normal bilaterally.   DIAGNOSTIC DATA (LABS, IMAGING, TESTING) - I reviewed patient records, labs, notes, testing and imaging myself where available.  Lab Results  Component Value Date   WBC 5.5 06/23/2020   HGB 15.1 06/23/2020   HCT 44.1 06/23/2020   MCV 96.1 06/23/2020   PLT 230.0 06/23/2020      Component Value Date/Time   NA 140 06/23/2020 0936   NA 143 05/21/2019 0940   K 4.0 06/23/2020 0936   CL 105 06/23/2020 0936   CO2 25 06/23/2020 0936   GLUCOSE 104 (H) 06/23/2020 0936   BUN 21 06/23/2020 0936   BUN 21 05/21/2019 0940   CREATININE 1.05 06/23/2020 0936   CALCIUM 9.4 06/23/2020 0936   PROT 7.3 06/23/2020 0936   ALBUMIN 4.7 06/23/2020 0936   AST 25 06/23/2020 0936   ALT 20 06/23/2020 0936   ALKPHOS 84 06/23/2020 0936   BILITOT 2.1 (H) 07/09/2020 1332   GFRNONAA >60 01/07/2020 1932   GFRAA >60 01/07/2020 1932   No results found for: CHOL, HDL, LDLCALC, LDLDIRECT, TRIG,  CHOLHDL No results found for: HGBA1C Lab Results  Component Value Date   VITAMINB12 1,197 08/15/2019   Lab Results  Component Value Date   TSH 2.010 08/15/2019      ASSESSMENT AND PLAN 81 y.o. year old male  has a past medical history of Actinic keratoses, Adenomatous polyp of colon (2006), Arthritis, Diverticulosis, Elevated fasting glucose, Herniated disc,  Junctional nevus, Lipidemia, Lyme disease (2020), Mild sleep apnea, Organic impotence, Plantar fasciitis, Pneumonia (1966), Prostate cancer ([place]), and Vestibular migraine. here with:  1.  Daily headaches 2.  Normal pressure hydrocephalus with status post shunting  -Patient will be started on a trial of Topamax 25 mg at bedtime to see if this is beneficial for his ongoing headaches. -Cautioned the patient about using Tylenol daily as this could be causing rebound headaches. -Patient reports that Dr. [name] is aware of his increased headaches after increasing the drainage. -Patient is advised that if his symptoms worsen or he develops new symptoms he should let us know. -Follow-up in 6 months or sooner if needed.  I spent 30 minutes of face-to-face and non-face-to-face time with patient.  This included previsit chart review, lab review, study review, order entry, electronic health record documentation, patient education.  Ward Givens, MSN, NP-C 10/12/2021, 1:18 PM [facility] [address], [place], [place] [address] [phone]  Made any corrections needed, and agree with history, physical, neuro exam,assessment and plan as stated.     Sarina Ill, MD [facility]

## 2021-10-13 ENCOUNTER — Ambulatory Visit: Payer: PPO | Admitting: Adult Health

## 2021-10-13 ENCOUNTER — Encounter: Payer: Self-pay | Admitting: Adult Health

## 2021-10-13 VITALS — BP 133/82 | HR 77 | Ht 71.0 in | Wt 191.0 lb

## 2021-10-13 DIAGNOSIS — G4733 Obstructive sleep apnea (adult) (pediatric): Secondary | ICD-10-CM

## 2021-10-13 DIAGNOSIS — Z9989 Dependence on other enabling machines and devices: Secondary | ICD-10-CM

## 2021-10-13 NOTE — Patient Instructions (Signed)
Continue using CPAP nightly and greater than 4 hours each night. Will increase [id number] Call in 1 month and we will pull another download If your symptoms worsen or you develop new symptoms please let us know.

## 2021-10-13 NOTE — Progress Notes (Signed)
PATIENT: Andrew Hayes DOB: May 26, 1941  REASON FOR VISIT: follow up HISTORY FROM: patient  HISTORY OF PRESENT ILLNESS: Today 10/13/21:  Andrew Hayes is a 81 year old male with a history of obstructive sleep apnea on CPAP.  He reports that he has had a hard time adjusting to the new machine.  Reports that his residual AHI is always high.  I will take data from August to September of 2022 it appears he uses machine 23 out of 38 days for compliance of 61%.  He uses machine greater than 4 hours 16 days.  Average usage is 5 hours and 1 minute.  His residual AHI is 13.6 on 7-13 cmH2O with max pressure at 13.4 L/min.  No significant leak.  I reviewed data from December to January.  He has used the machine only 4 days.  His residual AHI is 19.8 on 7 to 13 cm of water.  The patient has also been using his old machine I would like that data from December to January patient uses machine 23 out of 30 days for compliance of 77%.  He uses machine on average 6 hours and 7 minutes.  His residual AHI is 13.4 on 10 cm of water.   01/22/20:Andrew Hayes is a 81 year old male with a history of OSA on CPAP and vestibular migraines. Reports CPAP works well for him. No issues.   Migraines: since shunt was turned off not having migraines.   Vestibular migraines: reports that he has not had a severe episode. Occasionally will have some dizziness but episode does not last long.  Balance issues: patient continues to report balance issues. No falls. Feels like his feet are sponges. Has tried PT. Feels that he has neuropathy. Denies numbness and tingling. Has had NCV/EMG in 2016 that was normal.     HISTORY:   02/05/20:   Andrew Hayes is a 81 year old male with a history of vestibular migraines and normal pressure hydrocephalus post shunting.  He saw Dr. [name] last week and his shunt was adjusted from her drainage.  He reports that since then his headaches have increased.  He states that he has a  headache every day typically on the right side of the head in the frontal region.  He reports the worst his pain is been is a 7-8/10.  Currently it is 4 out of 10.  He reports that he has been taking extra strength Tylenol on a daily basis.  Reports that his headache does improve within 30 to 40 minutes if he lays down.  He reports with activity the headache seems to come back.  He reports initially after having the shunt if he coughed had a bowel movement or lifting object he felt more pressure in the brain.  He reports that his hearing has also worsened on the right side.  He reports that he is having mild dizziness with his headaches.  With more severe dizziness he does have nausea and vomiting.  Denies photophobia and phonophobia.  He returns today for an evaluation.  REVIEW OF SYSTEMS: Out of a complete 14 system review of symptoms, the patient complains only of the following symptoms, and all other reviewed systems are negative.   ESS 8  ALLERGIES: Allergies  Allergen Reactions   Percocet [Oxycodone-Acetaminophen] Other (See Comments)    Patient states he is unable to tolerate    HOME MEDICATIONS: Outpatient Medications Prior to Visit  Medication Sig Dispense Refill   atorvastatin (LIPITOR) 20 MG tablet Take  20 mg by mouth daily.     Cholecalciferol (VITAMIN D) 50 MCG (2000 UT) CAPS Take by mouth.     meclizine (ANTIVERT) 25 MG tablet Take 1 tablet (25 mg total) by mouth every 6 (six) hours as needed for dizziness or nausea. 30 tablet 6   Multiple Vitamin (MULTIVITAMIN PO) Take by mouth.     rizatriptan (MAXALT-MLT) 10 MG disintegrating tablet Take 1 tablet (10 mg total) by mouth as needed for migraine. May repeat in 2 hours if needed 10 tablet 11   UNABLE TO FIND Med Name: supplement for neuropathy     Acetaminophen (TYLENOL PO) Take by mouth. (Patient not taking: Reported on 10/13/2021)     atorvastatin (LIPITOR) 10 MG tablet Take 20 mg by mouth daily. (Patient not taking: Reported on  10/13/2021)     No facility-administered medications prior to visit.    PAST MEDICAL HISTORY: Past Medical History:  Diagnosis Date   Actinic keratoses    Adenomatous polyp of colon 2006   Arthritis    Diverticulosis    Mild   Elevated fasting glucose    Mildly   Herniated disc    Junctional nevus    Lipidemia    Mixed   Lyme disease 2020   pt placed on abx and stated he stopped taking vitamins so as not to interfere.   Mild sleep apnea    CPAP   Organic impotence    Plantar fasciitis    Pneumonia 1966   Prostate cancer [facility])    Vestibular migraine     PAST SURGICAL HISTORY: Past Surgical History:  Procedure Laterality Date   APPENDECTOMY     COLONOSCOPY     ELBOW SURGERY Left 2013   KNEE ARTHROSCOPY Bilateral 2013   knee and elbow   PLACEMENT OF LUMBAR DRAIN N/A 09/17/2019   Procedure: PLACEMENT OF LUMBAR DRAIN;  Surgeon: Judith Part, MD;  Location: [facility];  Service: Neurosurgery;  Laterality: N/A;  Lumbar drain placement   PROSTATECTOMY  1986   TONSILLECTOMY     VENTRICULOPERITONEAL SHUNT Right 01/07/2020   Procedure: Right ventriculoperitoneal shunt placement;  Surgeon: Judith Part, MD;  Location: [place];  Service: Neurosurgery;  Laterality: Right;    FAMILY HISTORY: Family History  Problem Relation Age of Onset   Stroke Mother    Hypertension Mother    Parkinson's disease Mother    COPD Father    Dementia Brother    Hydrocephalus Brother    Parkinson's disease Brother    Parkinson's disease Sister    Congestive Heart Failure Sister    Heart attack Maternal Grandfather        89's   Other Paternal Grandmother        chilbirth   Heart attack Paternal Grandfather        7's   Heart disease Daughter        deceased at 3 month   Colon cancer Neg Hx    Migraines Neg Hx        none to his knowledge    SOCIAL HISTORY: Social History   Socioeconomic History   Marital status: Married    Spouse name: Hassan Rowan   Number of children: 2    Years of education: 16   Highest education level: Not on file  Occupational History   Occupation: Manufacturing engineer: RETIRED    Comment: retired  Tobacco Use   Smoking status: Former    Packs/day: 2.00    Years:  24.00    Pack years: 48.00    Types: Cigarettes    Quit date: 10/11/1984    Years since quitting: 37.0   Smokeless tobacco: Never   Tobacco comments:    briefly used smokeless tobacco  Vaping Use   Vaping Use: Never used  Substance and Sexual Activity   Alcohol use: Yes    Alcohol/week: 5.0 standard drinks    Types: 5 Standard drinks or equivalent per week    Comment: 1 beer per day at most, 5/week   Drug use: No   Sexual activity: Not on file  Other Topics Concern   Not on file  Social History Narrative   Patient lives at home with family.   Patient consumes about 2 cups of caffeine daily,   right handed.   2 children (1 deceased)   Social Determinants of Health   Financial Resource Strain: Not on file  Food Insecurity: Not on file  Transportation Needs: Not on file  Physical Activity: Not on file  Stress: Not on file  Social Connections: Not on file  Intimate Partner Violence: Not on file      PHYSICAL EXAM  Vitals:   10/13/21 0924  BP: 133/82  Pulse: 77  Weight: 191 lb (86.6 kg)  Height: 5\' 11"  (1.803 m)   Body mass index is 26.64 kg/m.  Generalized: Well developed, in no acute distress  Chest: Lungs clear to auscultation bilaterally  Neurological examination  Mentation: Alert oriented to time, place, history taking. Follows all commands speech and language fluent Cranial nerve II-XII: Extraocular movements were full, visual field were full on confrontational test Head turning and shoulder shrug  were normal and symmetric. Motor: The motor testing reveals 5 over 5 strength of all 4 extremities. Good symmetric motor tone is noted throughout.  Sensory: Sensory testing is intact to soft touch on all 4 extremities. No evidence of extinction is  noted.  Gait and station: Gait is normal.    DIAGNOSTIC DATA (LABS, IMAGING, TESTING) - I reviewed patient records, labs, notes, testing and imaging myself where available.  Lab Results  Component Value Date   WBC 5.5 06/23/2020   HGB 15.1 06/23/2020   HCT 44.1 06/23/2020   MCV 96.1 06/23/2020   PLT 230.0 06/23/2020      Component Value Date/Time   NA 140 06/23/2020 0936   NA 143 05/21/2019 0940   K 4.0 06/23/2020 0936   CL 105 06/23/2020 0936   CO2 25 06/23/2020 0936   GLUCOSE 104 (H) 06/23/2020 0936   BUN 21 06/23/2020 0936   BUN 21 05/21/2019 0940   CREATININE 1.05 06/23/2020 0936   CALCIUM 9.4 06/23/2020 0936   PROT 7.3 06/23/2020 0936   ALBUMIN 4.7 06/23/2020 0936   AST 25 06/23/2020 0936   ALT 20 06/23/2020 0936   ALKPHOS 84 06/23/2020 0936   BILITOT 2.1 (H) 07/09/2020 1332   GFRNONAA >60 01/07/2020 1932   GFRAA >60 01/07/2020 1932    Lab Results  Component Value Date   VITAMINB12 1,197 08/15/2019   Lab Results  Component Value Date   TSH 2.010 08/15/2019      ASSESSMENT AND PLAN 81 y.o. year old male  has a past medical history of Actinic keratoses, Adenomatous polyp of colon (2006), Arthritis, Diverticulosis, Elevated fasting glucose, Herniated disc, Junctional nevus, Lipidemia, Lyme disease (2020), Mild sleep apnea, Organic impotence, Plantar fasciitis, Pneumonia (1966), Prostate cancer ([place]), and Vestibular migraine. here with:  OSA on CPAP  - CPAP compliance suboptimal  with new machine -Residual AHI remains elevated -We will increase pressure 7-15 cmH2O - Encourage patient to use CPAP nightly and > 4 hours each night -Patient will contact us in 1 month for repeat download    - F/U in 6 months or sooner if needed    Ward Givens, MSN, NP-C 10/13/2021, 9:37 AM [facility] [address], [place], [place] [address] [phone]  Made any corrections needed, and agree with history, physical, neuro exam,assessment  and plan as stated.     Sarina Ill, MD [facility]

## 2021-10-14 ENCOUNTER — Telehealth: Payer: Self-pay | Admitting: *Deleted

## 2021-10-14 DIAGNOSIS — Z9989 Dependence on other enabling machines and devices: Secondary | ICD-10-CM

## 2021-10-14 DIAGNOSIS — G4733 Obstructive sleep apnea (adult) (pediatric): Secondary | ICD-10-CM

## 2021-10-14 NOTE — Telephone Encounter (Signed)
RE: cpap Received: Today Denyse Amass, RN; Vanessa Ralphs got it!      Previous Messages   ----- Message -----  From: Brandon Melnick, RN  Sent: 10/14/2021   8:57 AM EST  To: Vanessa Ralphs, Marchelle Gearing, *  Subject: cpap                                           Good morning,  Happy New Year.  Megan saw this pt yesterday and she noted :    1. OSA on CPAP     - CPAP compliance suboptimal with new machine  -Residual AHI remains elevated  -We will increase pressure 7-15 cmH2O  - Encourage patient to use CPAP nightly and > 4 hours each night  -Patient will contact us in 1 month for repeat download   FYI on  [name]  Male, 81 y.o., 1940/11/22  MRN:  [id number]   [facility] RN  ----- Message -----  From: Marchelle Gearing  Sent: 10/08/2021  12:19 PM EST  To: Vanessa Ralphs, Brandon Melnick, RN   Patient will need to return the machine as he hasn't been using it. Insurance will not pay since he didn't meet the compliance within the 90 days. I can't do any tagging in [place]. There are not any notes in the system stating that he was having problems with the machine. The only notes in there is where he was having problems with the mask.     ----- Message -----  From: Brandon Melnick, RN  Sent: 10/08/2021  11:55 AM EST  To: Marchelle Gearing   I set up appt 10-13-21 at 0930.  He states has not been using his ibreeze having issues with machine and masks (spoken to you all 2 times or so).  He is using  his resmed machine.  Plus I don't see him tagged to ibreeze, can you do that to.  Thanks Lovey Newcomer RN  ----- Message -----  From: Marchelle Gearing  Sent: 10/08/2021  10:38 AM EST  To: Vanessa Ralphs, Brandon Melnick, RN   He received a Lewayne Bunting machine on 05/27/2021. Can you please see if yall can get him in sooner?   ----- Message -----  From: Brandon Melnick, RN  Sent: 10/08/2021  10:29 AM EST  To: Marchelle Gearing   Sure be glad to help.  Did he get a new machine??  Set up date?  Has appt in April 2023 with Megan NP.  ----- Message -----  From: Marchelle Gearing  Sent: 10/08/2021  10:14 AM EST  To: Vanessa Ralphs, Brandon Melnick, RN   Hey can you help with this? Patient will need to have a follow up appointment for insurance to continue paying for machine. Can you please reach out and see if you can get him scheduled?

## 2021-10-15 NOTE — Telephone Encounter (Signed)
Denyse Amass, RN; Vanessa Ralphs Patient is going to drop his machine off and a tech will take a look at it.

## 2021-10-15 NOTE — Telephone Encounter (Signed)
Spoke to pt this am.  He states that his new machine is not not working right (he has not airflow).  He requested to have [place] assist with [employer] to see about this.  I sent message to [facility] with [employer] to see what he needs to do.

## 2021-10-21 NOTE — Telephone Encounter (Signed)
Received a fax from Venetie stating pt's machine has been switched out due to a whining noise. Resmed S10, s/n [id number]

## 2021-10-29 DIAGNOSIS — G4733 Obstructive sleep apnea (adult) (pediatric): Secondary | ICD-10-CM | POA: Diagnosis not present

## 2021-11-10 DIAGNOSIS — G479 Sleep disorder, unspecified: Secondary | ICD-10-CM | POA: Diagnosis not present

## 2021-11-10 DIAGNOSIS — R519 Headache, unspecified: Secondary | ICD-10-CM | POA: Diagnosis not present

## 2021-11-10 DIAGNOSIS — G629 Polyneuropathy, unspecified: Secondary | ICD-10-CM | POA: Diagnosis not present

## 2021-11-10 NOTE — Telephone Encounter (Signed)
Spoke with the patient.  He states his numbers on his new machine have been terrible.  He is referring to the AHI and says it has been over 10 every night.  He does not have any concerns about leakage or mask fit.  He wonders if the pressure is too low.  He also reports he still wakes up at night even though he takes Unisom most nights.  He also wanted to let us know of an incoming referral for tingling on the side of his head down the track of where he had his VP shunt placed.  He states he saw neurosurgery who did not feel this was related.  He saw primary care this morning and anticipates a referral to our office.  He states this has been ongoing for 2 to 3 months, is intermittent throughout the day but has worsened.  He wants to know the cause of the tingling.  I let him know as of right now I do not see an incoming referral. When we receive the referral we will give him a call once it has been reviewed.  I encouraged the patient to follow-up Thursday morning.  I let him know the CPAP data would be sent to [facility] NP for review and we will call him back.  He verbalized appreciation.

## 2021-11-10 NOTE — Telephone Encounter (Signed)
Pt would like a call from the [facility] to discuss CPAP result on home machine.

## 2021-11-11 NOTE — Addendum Note (Signed)
Addended by: Trudie Buckler on: 11/11/2021 03:07 PM   Modules accepted: Orders

## 2021-11-11 NOTE — Telephone Encounter (Signed)
Spoke with the patient and discussed the message from [place] NP.  Patient is amenable to proceeding with a CPAP titration sleep study with the option to transition to BiPAP if needed.  He understands the reason why we would order this.  He also asked if we have received the referral from Dr. Harrington Challenger and I confirmed we have.  I was able to transfer the call to referrals to get him scheduled for a separate appointment.  Patient is aware to wait for a call from the sleep lab to schedule sleep study, may take approximately 2 weeks.  He was very appreciative for the call.

## 2021-11-11 NOTE — Telephone Encounter (Signed)
Adjusting his pressure residual AHI has remained elevated despite is adjusting his pressure.  At this point I would say we need to bring him in for a CPAP titration with the option to transition to BiPAP if needed.  Most of his apneas are central per the download.  This may be the reason he is still waking up frequently at night.  If he is amenable to that we can place the order

## 2021-11-24 ENCOUNTER — Telehealth: Payer: Self-pay

## 2021-11-24 NOTE — Telephone Encounter (Signed)
LVM for pt to call me back to schedule sleep study  

## 2021-11-29 DIAGNOSIS — G4733 Obstructive sleep apnea (adult) (pediatric): Secondary | ICD-10-CM | POA: Diagnosis not present

## 2021-11-30 ENCOUNTER — Encounter: Payer: Self-pay | Admitting: Neurology

## 2021-11-30 ENCOUNTER — Ambulatory Visit: Payer: PPO | Admitting: Neurology

## 2021-11-30 ENCOUNTER — Ambulatory Visit (INDEPENDENT_AMBULATORY_CARE_PROVIDER_SITE_OTHER): Payer: PPO | Admitting: Neurology

## 2021-11-30 VITALS — BP 161/84 | HR 65 | Ht 71.0 in | Wt 192.8 lb

## 2021-11-30 DIAGNOSIS — G4733 Obstructive sleep apnea (adult) (pediatric): Secondary | ICD-10-CM

## 2021-11-30 DIAGNOSIS — G20 Parkinson's disease: Secondary | ICD-10-CM | POA: Diagnosis not present

## 2021-11-30 DIAGNOSIS — G20C Parkinsonism, unspecified: Secondary | ICD-10-CM

## 2021-11-30 DIAGNOSIS — G4720 Circadian rhythm sleep disorder, unspecified type: Secondary | ICD-10-CM

## 2021-11-30 DIAGNOSIS — M6289 Other specified disorders of muscle: Secondary | ICD-10-CM

## 2021-11-30 DIAGNOSIS — R251 Tremor, unspecified: Secondary | ICD-10-CM

## 2021-11-30 DIAGNOSIS — G4731 Primary central sleep apnea: Secondary | ICD-10-CM

## 2021-11-30 DIAGNOSIS — R29898 Other symptoms and signs involving the musculoskeletal system: Secondary | ICD-10-CM | POA: Diagnosis not present

## 2021-11-30 DIAGNOSIS — G629 Polyneuropathy, unspecified: Secondary | ICD-10-CM

## 2021-11-30 DIAGNOSIS — R29818 Other symptoms and signs involving the nervous system: Secondary | ICD-10-CM

## 2021-11-30 DIAGNOSIS — R269 Unspecified abnormalities of gait and mobility: Secondary | ICD-10-CM | POA: Diagnosis not present

## 2021-11-30 DIAGNOSIS — R292 Abnormal reflex: Secondary | ICD-10-CM

## 2021-11-30 DIAGNOSIS — R270 Ataxia, unspecified: Secondary | ICD-10-CM | POA: Diagnosis not present

## 2021-11-30 DIAGNOSIS — Z820 Family history of epilepsy and other diseases of the nervous system: Secondary | ICD-10-CM | POA: Diagnosis not present

## 2021-11-30 NOTE — Progress Notes (Signed)
[name]    Provider:  Dr [name] Requesting Provider: [name], MD Primary Care Provider:  [name], MD  CC:  normal pressure status post shunting still with problems if imbalance and ataxia  Interval history November 30, 2021: This is a patient that is very well-known to the practice.  He is here again today with "itchiness" where the shunt is and continued imbalance.  We have seen him for multiple issues about the shunt.  He initially came to [place] with imbalance, and brain imaging possibly consistent with NPH, his brother had NPH and dementia so he was very concerned and very eager to get a shunt, we sent him to Dr. [name] who put in a temporary lumbar drain which made him feel considerably better so the shunt was placed, since he has had the shunting he has returned multiple times with multiple symptoms including headaches.  We have extensively evaluated him and placed the shunt to try and avoid NPH in him, but at this point I explained to him I have nothing more for him as far as the shunt goes, a little itchiness that doesn't bother him is minor, if the shunt is bothering him that much he can talk to Dr. [name] to see if it can be removed and we can monitor him.  He is using his CPAP, last time I saw him in 2021 he felt improved with his vertigo and headache.  He has tingling and itching near the tube, not painful. He still feels imbalanced. He has sponginess in his feet. He still has that. He saw someone who diagnosed him and gave him infrared treatment. He says several people diagnosed him with peripheral neuropathy. Doesn't affect his life. No pain. Just a sponginess that is stable for years 5-6 years ago. He already saw Dr. [name]. He has some decreased fine motor in the right hand. Also still with imbalance. Can't tie his tie, started 5-6 ago, all stable. He practices balance.  He has neck pain, pain with head extension, extensive hx of  parkinson's disease in mother, brother had NPH AND had parkinson's diesease, and sister had Parkinson's disease. No hx of dopaminerig drugs. Mother had essential essential tremor. He feels like his handwriting is worse, shaky, he has to hold his arm, no smaller, no loss of taste or smell. He has difficulty swallowing and neck pain. Tremor is new. Walking has been slower, needs to think about it to walk faster. No significant back pain or radicular symptoms. No other focal neurologic deficits, associated symptoms, inciting events or modifiable factors.  Patient complains of symptoms per HPI as well as the following symptoms: tremor, slowing down, handwriting problems . Pertinent negatives and positives per HPI. All others negative  Personally reviewed images prior to appointment, additional 15 minutes. 02/07/2020: ct head: IMPRESSION: 1. Sequelae of right superior frontal approach CSF shunt placement. Stable ventricle size and configuration, no adverse features. 2. No new intracranial abnormality  12/2019 ct head: IMPRESSION: 1. Right frontal approach shunt catheter terminates near the foramina of [place]. Small amount of pneumocephalus at the right frontal pole. 2. Decreased size of the lateral ventricles compared to 07/07/2019.  Interval history 01/16/2020: He had the shunting but he developed a headache. The headache is more on the right side behind the eye socket and is throbbing but just an ache, just irritating now but it did get to 6-7/10. He started using his cpap again. He feels improved with his vertigo and headache. We  can try nurtec and a nerve block, will not send to infusion as he looks completely comfortable. He is having more of his vestibular episodes, we discussed trying acute management medications and if it gets worse or more frequent trying a preventative. He used to go once a year or every 6 months, now its more monthly but not as severe. So we will try nurtec. He will follow up with  Dr. [name] to change the shunt settings.   Performed by Dr. [name] M.D. All procedures a documented blood were medically necessary, reasonable and appropriate based on the patient's history, medical diagnosis and physician opinion. Verbal informed consent was obtained from the patient, patient was informed of potential risk of procedure, including bruising, bleeding, hematoma formation, infection, muscle weakness, muscle pain, numbness, transient hypertension, transient hyperglycemia and transient insomnia among others. All areas injected were topically clean with isopropyl rubbing alcohol. Nonsterile nonlatex gloves were worn during the procedure.  1. Greater occipital nerve block [phone]). The greater occipital nerve site was identified at the nuchal line medial to the occipital artery. Medication was injected into the  right occipital nerve areas and suboccipital areas. Patient's condition is associated with inflammation of the greater occipital nerve and associated multiple groups. Injection was deemed medically necessary, reasonable and appropriate. Injection represents a separate and unique surgical service.  2. Supraorbital nerve block (64400): Supraorbital nerve site was identified along the incision of the frontal bone on the orbital/supraorbital ridge. Medication was injected into the right supraorbital nerve areas. Patient's condition is associated with inflammation of the supraorbital and associated muscle groups. Injection was deemed medically necessary, reasonable and appropriate. Injection represents a separate and unique surgical service.   Interval history January 15, 2020: Patient has undergone lumbar drain the last several days, he was discharged from the hospital just 2 days ago, I did discuss his case with Dr. [name] who stated he had a headache in the hospital and adjusted his drain, patients here for headache.   Interval history 10/16/2019: Patient for follow up after lumbar drain  placement. Patient feels as though he improved as far as his balance, he definitely improved. It was not "drastic" but it was signigifant. I reminded him we are catching this early and so his improvement may not be as drastic as someone we diagnose in the later stages of NPH. Also discuss his episodes of dizziness may also be due to possible NPH ve vestibular migraines. We discussed this at length and he is going to see Dr [name].  Interval history 08/15/2019: Patient with episodes of dizziness unclear etiology. Possibly central associated with migraines. He has had 2 episodes and treated with migraine IV infusion of Depacon. We discussed migraine preventative, he says meats with nitrates cause a lot of problems and triggers episodes, He doesn't have a problem with bacon but feels lunch meat affects him particularly salami. He does not have these often. 3 years ago he was having them every few months, now he has had a few more. He declines preventative, he will try to take it sooner right at the onset he will take rizatriptan. We also discussed his MRI today, reviewed images, he has some memory problems, he feels his balance is poor. His brother has NPH. He feels his memory is impaired. Reviewed MRI images with him, showed him very large ventricles.  Interval history: Patient started experiencing vestibular symptoms several days ago. 2 days ago it put him in bed. Unclear if vestibular migraines or other etiology. Discussed we  should take him to the IV infusion lab and treat him like a migraine and if symptoms resolve then this is a likely indication this is central in origin and not due to canaliths for example, I suspect vestibular migraines, discussed with patient and agreed on treatment. In the furute he will call then symptoms starts for 1g depacon, today his symptoms resolved afte rtreatment which included a fogginess, dizzines, not feeling right in the head, imbalance.  HPI:  Andrew Hayes is a 81  y.o. male here as requested by [name], MD for dizziness. He was 81 years old with the first episode of vertigo, he has to lay down, a feeling of spinning and if he doesn't lay down perfectly flat keep his head on the bed he gets nauseated. Worst one was 4 years ago he was in bed for days and every time he got up he vomited. He has been evaluated by [facility] and multiple ENTs and diagnosed with vestibular migraines. He will episodes are a little dizziness, he takes rizatriptan and it does get very bad after that, but he has a "hangover" and may not feel good for a few days but he can avoid the severe vertigo and vomiting. He is getting these "little" episodes more frequently 2x a month that last for 4-5 days. No inciting events, he has evaluated all possible triggers and none found. He even tried a migraine diet for 3-4 months recommended by [facility] and didn;t help. He never gets headaches with the episodes. He has been to vestibular therapy. He drinks enough water, he has had brain MRIs, He is having more frequent episodes, he feels his balance is impaired and sometimes he does not walk well, hearing changes. No Fhx of migraines.  Reviewed notes, labs and imaging from outside physicians, which showed:  IMPRESSION: MRI brain 2016 personally reviewed images 1. No imaging explanation for vestibular cochlear symptoms, as above. 2. Ventriculomegaly. Are there any symptoms of normal pressure hydrocephalus  Review of Systems: Patient complains of symptoms per HPI as well as the following symptoms: headache, incision pain. Pertinent negatives and positives per HPI. All others negative   Social History   Socioeconomic History   Marital status: Married    Spouse name: Hassan Rowan   Number of children: 2   Years of education: 16   Highest education level: Not on file  Occupational History   Occupation: Manufacturing engineer: RETIRED    Comment: retired  Tobacco Use   Smoking status: Former    Packs/day:  2.00    Years: 24.00    Pack years: 48.00    Types: Cigarettes    Quit date: 10/11/1984    Years since quitting: 37.1   Smokeless tobacco: Never   Tobacco comments:    briefly used smokeless tobacco  Vaping Use   Vaping Use: Never used  Substance and Sexual Activity   Alcohol use: Not Currently    Alcohol/week: 4.0 standard drinks    Types: 4 Cans of beer per week    Comment: 1 beer per day at most, 5/week   Drug use: No   Sexual activity: Not on file  Other Topics Concern   Not on file  Social History Narrative   Patient lives at home with family.   Patient consumes about 2 cups of caffeine daily,   right handed.   2 children (1 deceased)   Social Determinants of Health   Financial Resource Strain: Not on file  Food Insecurity: Not on file  Transportation Needs: Not on file  Physical Activity: Not on file  Stress: Not on file  Social Connections: Not on file  Intimate Partner Violence: Not on file    Family History  Problem Relation Age of Onset   Stroke Mother    Hypertension Mother    Parkinson's disease Mother    COPD Father    Parkinson's disease Sister    Congestive Heart Failure Sister    Dementia Brother    Hydrocephalus Brother    Parkinson's disease Brother    Heart attack Maternal Grandfather        50's   Other Paternal Grandmother        chilbirth   Heart attack Paternal Grandfather        60's   Heart disease Daughter        deceased at 37 month   Colon cancer Neg Hx    Migraines Neg Hx        none to his knowledge   Neuropathy Neg Hx     Past Medical History:  Diagnosis Date   Actinic keratoses    Adenomatous polyp of colon 2006   Arthritis    Diverticulosis    Mild   Elevated fasting glucose    Mildly   Herniated disc    Junctional nevus    Lipidemia    Mixed   Lyme disease 2020   pt placed on abx and stated he stopped taking vitamins so as not to interfere.   Mild sleep apnea    CPAP   Organic impotence    Plantar  fasciitis    Pneumonia 1966   Prostate cancer [facility])    Vestibular migraine     Patient Active Problem List   Diagnosis Date Noted   NPH (normal pressure hydrocephalus) ([place]) 01/07/2020   Normal pressure hydrocephalus ([place]) 09/17/2019   Dizziness 08/15/2019   Migraine without aura and without status migrainosus, not intractable 08/15/2019   Vestibular migraine 06/25/2019   Fever and chills 05/28/2019   Impingement syndrome of left shoulder 08/10/2017   Chronic left shoulder pain 07/21/2017   Dysphagia 06/09/2015   History of colonic polyps 06/09/2015   Dizzy spells 03/08/2012   Hyperlipidemia 03/08/2012    Past Surgical History:  Procedure Laterality Date   APPENDECTOMY     COLONOSCOPY     ELBOW SURGERY Left 2013   KNEE ARTHROSCOPY Bilateral 2013   knee and elbow   PLACEMENT OF LUMBAR DRAIN N/A 09/17/2019   Procedure: PLACEMENT OF LUMBAR DRAIN;  Surgeon: Judith Part, MD;  Location: [place];  Service: Neurosurgery;  Laterality: N/A;  Lumbar drain placement   PROSTATECTOMY  1986   TONSILLECTOMY     VENTRICULOPERITONEAL SHUNT Right 01/07/2020   Procedure: Right ventriculoperitoneal shunt placement;  Surgeon: Judith Part, MD;  Location: [place];  Service: Neurosurgery;  Laterality: Right;    Current Outpatient Medications  Medication Sig Dispense Refill   atorvastatin (LIPITOR) 20 MG tablet Take 20 mg by mouth daily.     Cholecalciferol (VITAMIN D) 50 MCG (2000 UT) CAPS Take by mouth.     Doxylamine Succinate, Sleep, (UNISOM PO) Take by mouth at bedtime as needed.     gabapentin (NEURONTIN) 100 MG capsule 1-3 capsule     Multiple Vitamin (MULTIVITAMIN PO) Take by mouth.     rizatriptan (MAXALT-MLT) 10 MG disintegrating tablet Take 1 tablet (10 mg total) by mouth as needed for migraine. May repeat in 2  hours if needed 10 tablet 11   UNABLE TO FIND Med Name: supplement for neuropathy     No current facility-administered medications for this visit.    Allergies as  of 11/30/2021 - Review Complete 11/30/2021  Allergen Reaction Noted   Percocet [oxycodone-acetaminophen] Other (See Comments) 05/23/2015    Vitals: BP (!) 161/84    Pulse 65    Ht '5\' 11"'  (1.803 m)    Wt 192 lb 12.8 oz (87.5 kg)    BMI 26.89 kg/m  Last Weight:  Wt Readings from Last 1 Encounters:  11/30/21 192 lb 12.8 oz (87.5 kg)   Last Height:   Ht Readings from Last 1 Encounters:  11/30/21 '5\' 11"'  (1.803 m)   Physical exam: Exam: Gen: NAD, conversant, well nourised, well groomed                   Eyes: Conjunctivae clear without exudates or hemorrhage  Neuro: Detailed Neurologic Exam  Speech:    Speech is normal; fluent and spontaneous with normal comprehension.  Cognition:    The patient is oriented to person, place, and time;     recent and remote memory intact;     language fluent;     normal attention, concentration,     fund of knowledge Cranial Nerves:    The pupils are equal, round, and reactive to light. Visual fields are full. Extraocular movements are intact. Trigeminal sensation is intact and the muscles of mastication are normal. The face is symmetric. Marland Kitchen Hearing impaired. Voice is normal. Shoulder shrug is normal. The tongue has normal motion without fasciculations.   Coordination:    No dysmetria. Ataxic on tandem.  Gait:    significant Imbalance with tandem. Can heel and toe walk. Slightly stooped posture  Motor Observation:    Postural and action tremor, slight re-emergent tremor right hand when walking Tone:    Mild cogwheeling upper right extremity vs paratonia    Strength:    Strength is V/V in the upper and lower limbs.      Sensation: impaired distally to all modalities in a gradient fashion pp, temp, vibration few secs, impaired proprioception at toes.      Reflex Exam: absent Ajs, otherwise brisk   Assessment/Plan:  Patients with continued imbalance and new tremor. He has been extensively evaluated by ENT and possibly vestibular migraines  however does not fit completely with this diagnosis (never had headache with the dizziness which is often present with at least some of the dizzy episodes). Imaging ruled out strokes, schwannomas, vascular etiologies, carotid stenosis.  Also s/p shunting for NPH, reassured patient may take time to heal.  - he has a new tremor and possibly cogwheeling in right upper extremity. He has decreased fine motor upper right hand. He has extensive FHx of parkinson's disease. He also has a postural and action tremor. Will order a DAT scan to distinguish between ealy PD symptoms vs essential tremor. extensive hx of parkinson's disease in mother, brother had NPH AND had parkinson's diesease, and sister had Parkinson's disease. No hx of dopaminerig drugs. Mother had essential essential tremor as well.  - He has distal neuropathy in his feet. We have tested him in the past for several etiologies such as B12. We will add several more labs.   - He also has chronic neck pain and ataxia, need to check for cervical stenosis/myelopathy  - He says his shunt is a little itchy; initially came to [place] with imbalance, and brain imaging  possibly consistent with NPH, his brother had NPH and dementia so he was very concerned and very eager to get a shunt, we sent him to Dr. [name] who put in a temporary lumbar drain which made him feel considerably better so the shunt was placed, since he has had the shunting he has returned multiple times with multiple symptoms including headaches.  We have extensively evaluated him and placed the shunt to try and avoid NPH in him, but at this point I explained to him I have nothing more for him as far as the shunt goes, a little itchiness that doesn't bother him is minor, if the shunt is bothering him that much he can talk to Dr. [name] to see if it can be removed and we can monitor him  - MRI shows large ventricles: s/p shunting. He feels strange with gait, he had a thorough workup for  neuropathy which was negative including emg/ncs.  Will test a few more labs.  - rizatriptan acutely, also he felt the IV infusion for migraines helped when he had his dizziness we can always do that again if needed. Today tried nurtec and if episodes becomemore frequent we can discuss preventative  Orders Placed This Encounter  Procedures   MR CERVICAL SPINE WO CONTRAST   NM BRAIN DATSCAN TUMOR LOC INFLAM SPECT 1 DAY   Multiple Myeloma Panel (SPEP&IFE w/QIG)   TSH   Sjogren's syndrome antibods(ssa + ssb)   Heavy metals, blood   T4, Free      Cc: [name], MD  Sarina Ill, MD  [facility] [address] [place], [place] [address]  I spent 70 minutes of face-to-face and non-face-to-face time with patient on the  1. Ataxia   2. Tremor   3. Peripheral polyneuropathy   4. Brisk deep tendon reflexes   5. Abnormal increased muscle tone   6. Gait abnormality   7. Parkinsonism, unspecified Parkinsonism type ([place])   8. Fine motor skill loss   9. FHx: Parkinson's disease     diagnosis.  This included previsit chart review, lab review, study review, order entry, electronic health record documentation, patient education on the different diagnostic and therapeutic options, counseling and coordination of care, risks and benefits of management, compliance, or risk factor reduction. This does not include time spent on injections.

## 2021-11-30 NOTE — Patient Instructions (Addendum)
DAt Scan to evaluate for parkinson's disease MRi crvical spine for spinal cord myelopathy which can cause imbalance Blood work for peripheral neuropathy  Peripheral Neuropathy Peripheral neuropathy is a type of nerve damage. It affects nerves that carry signals between the spinal cord and the arms, legs, and the rest of the body (peripheral nerves). It does not affect nerves in the spinal cord or brain. In peripheral neuropathy, one nerve or a group of nerves may be damaged. Peripheral neuropathy is a broad category that includes many specific nerve disorders, like diabetic neuropathy, hereditary neuropathy, and carpal tunnel syndrome. What are the causes? This condition may be caused by: Diabetes. This is the most common cause of peripheral neuropathy. Nerve injury. Pressure or stress on a nerve that lasts a long time. Lack (deficiency) of B vitamins. This can result from alcoholism, poor diet, or a restricted diet. Infections. Autoimmune diseases, such as rheumatoid arthritis and systemic lupus erythematosus. Nerve diseases that are passed from parent to child (inherited). Some medicines, such as cancer medicines (chemotherapy). Poisonous (toxic) substances, such as lead and mercury. Too little blood flowing to the legs. Kidney disease. Thyroid disease. In some cases, the cause of this condition is not known. What are the signs or symptoms? Symptoms of this condition depend on which of your nerves is damaged. Common symptoms include: Loss of feeling (numbness) in the feet, hands, or both. Tingling in the feet, hands, or both. Burning pain. Very sensitive skin. Weakness. Not being able to move a part of the body (paralysis). Muscle twitching. Clumsiness or poor coordination. Loss of balance. Not being able to control your bladder. Feeling dizzy. Sexual problems. How is this diagnosed? Diagnosing and finding the cause of peripheral neuropathy can be difficult. Your health care  provider will take your medical history and do a physical exam. A neurological exam will also be done. This involves checking things that are affected by your brain, spinal cord, and nerves (nervous system). For example, your health care provider will check your reflexes, how you move, and what you can feel. You may have other tests, such as: Blood tests. Electromyogram (EMG) and nerve conduction tests. These tests check nerve function and how well the nerves are controlling the muscles. Imaging tests, such as CT scans or MRI to rule out other causes of your symptoms. Removing a small piece of nerve to be examined in a lab (nerve biopsy). Removing and examining a small amount of the fluid that surrounds the brain and spinal cord (lumbar puncture). How is this treated? Treatment for this condition may involve: Treating the underlying cause of the neuropathy, such as diabetes, kidney disease, or vitamin deficiencies. Stopping medicines that can cause neuropathy, such as chemotherapy. Medicine to help relieve pain. Medicines may include: Prescription or over-the-counter pain medicine. Antiseizure medicine. Antidepressants. Pain-relieving patches that are applied to painful areas of skin. Surgery to relieve pressure on a nerve or to destroy a nerve that is causing pain. Physical therapy to help improve movement and balance. Devices to help you move around (assistive devices). Follow these instructions at home: Medicines Take over-the-counter and prescription medicines only as told by your health care provider. Do not take any other medicines without first asking your health care provider. Do not drive or use heavy machinery while taking prescription pain medicine. Lifestyle  Do not use any products that contain nicotine or tobacco, such as cigarettes and e-cigarettes. Smoking keeps blood from reaching damaged nerves. If you need help quitting, ask your health care  provider. Avoid or limit  alcohol. Too much alcohol can cause a vitamin B deficiency, and vitamin B is needed for healthy nerves. Eat a healthy diet. This includes: Eating foods that are high in fiber, such as fresh fruits and vegetables, whole grains, and beans. Limiting foods that are high in fat and processed sugars, such as fried or sweet foods. General instructions  If you have diabetes, work closely with your health care provider to keep your blood sugar under control. If you have numbness in your feet: Check every day for signs of injury or infection. Watch for redness, warmth, and swelling. Wear padded socks and comfortable shoes. These help protect your feet. Develop a good support system. Living with peripheral neuropathy can be stressful. Consider talking with a mental health specialist or joining a support group. Use assistive devices and attend physical therapy as told by your health care provider. This may include using a walker or a cane. Keep all follow-up visits as told by your health care provider. This is important. Contact a health care provider if: You have new signs or symptoms of peripheral neuropathy. You are struggling emotionally from dealing with peripheral neuropathy. Your pain is not well-controlled. Get help right away if: You have an injury or infection that is not healing normally. You develop new weakness in an arm or leg. You have fallen or do so frequently. Summary Peripheral neuropathy is when the nerves in the arms, or legs are damaged, resulting in numbness, weakness, or pain. There are many causes of peripheral neuropathy, including diabetes, pinched nerves, vitamin deficiencies, autoimmune disease, and hereditary conditions. Diagnosing and finding the cause of peripheral neuropathy can be difficult. Your health care provider will take your medical history, do a physical exam, and do tests, including blood tests and nerve function tests. Treatment involves treating the  underlying cause of the neuropathy and taking medicines to help control pain. Physical therapy and assistive devices may also help. This information is not intended to replace advice given to you by your health care provider. Make sure you discuss any questions you have with your health care provider. Document Revised: 07/08/2020 Document Reviewed: 07/08/2020 Elsevier Patient Education  2022 [place] How to prepare and what to expect What is a brain DaTscan? A brain DaTscan is a nuclear medicine scan. It uses radioactive material to diagnose some diseases of the brain, especially those that cause tremor (shakiness). DaTscan is a brand name for a drug called ioflupane I-123. A brain DaTscan is a form of radiology, because radiation is used to take pictures of the body. This radioactive drug is ordered especially for you. Because of this, we need at least 72 hours notice if you must cancel or reschedule your scan.   How does the scan work? You will be given a small dose of tracer (radioactive material) through an intravenous (IV) line. This tracer will collect in part of your brain and give off gamma rays. A special camera called a gamma camera will use these rays to produce pictures and measurements of your brain. How do I prepare? Some drugs will affect the results of your brain DaTscan. You will need to stop taking these drugs before your scan. The table on page 2 lists the drugs that need to be stopped, and for how many days before your scan. This list is in alphabetical order by the generic name of the drug. The common brand names are listed beneath the generic name. Please confirm  these instructions with your doctor who prescribed the drug. Drugs to Stop Taking Before your scan, stop taking these medicines for the length of time shown: Name of Drug Stop Taking  Amoxapine 4 days before  Benztropine  Cogentin 3 days before  Bupropion (Aplenzin, Budeprion, Voxra,  Wellbutrin, Zyban) 48 hours before  Buspirone 15 hours before  Citalopram 24 hours before  Cocaine 6 hours before  Escitalopram 24 hours before  Methamphetamine 24 hours before  Methylphenidate (Concerta, Metadate, Methylin, Ritalin) 20 hours before  Paroxetine 24 hours before  Selegilene 48 hours before  Sertraline 3 days before  If you are breastfeeding, or if there is any chance you are pregnant, please tell the scheduler or technologist (the person who will help you prepare for your scan). How is the scan done? When you first arrive, we will ask you to drink a small cup of water with potassium iodine in it. This water may have a metallic taste.  An hour after you drink the potassium iodine water, the technologist will inject a small amount of tracer into a vein in your arm or hand through your IV.  You must stay in the department for 30 minutes after the injection.  You will then have a break for 3 hours. It is OK to eat and drink during this break.  You must return to the clinic after this 3-hour break to have images of your brain taken.  Then, 4 hours after you receive your tracer injection, the technologist will take images of your brain with the gamma camera. You will lie flat on the exam table while these images are being taken.  You must not move while the camera is taking pictures. If you move, the pictures will be blurry and may have to be taken again.  Taking the images will take 40 to 45 minutes. Your total time in the imaging room will be about 1 hour.  You may also have a low-dose CT scan of your brain to help confirm any results. A CT scan is another way to take images inside your body.  It will take about 5 hours from the time you drink the potassium iodine water until the scans are complete. What will I feel during the scan? The technologist will help make you as comfortable as possible on the exam table for the scan.  You may feel some minor discomfort from the IV.   Lying still on the exam table may be hard for some patients.  The camera will be close to your head. This may make you feel confined or uneasy (claustrophobic). Please tell the doctor who referred you for this scan if you know you are claustrophobic. Are there any side effects from the scan? Most of the radioactivity from the tracer will pass out of your body in your urine or stool. The rest simply goes away over time.  Bad reactions to this scan are very rare. Fewer than 1% of patients (fewer than 1 out of 100) have a bad reaction. Reactions may include headache, nausea, vertigo (dizziness), or dry mouth. How do I get the results? When the test is over, the nuclear medicine doctor will review your images, prepare a written report, and talk with your doctor about the results. Your doctor will then talk with you about the results and your treatment options. If you needed to stop taking any medicines on the day of your scan, ask your doctor when to start taking them again.  The potentially  interfering drugs consist of: amoxapine, amphetamine, benztropine, bupropion, buspirone, citalopram, cocaine, mazindol, methamphetamine, methylphenidate, norephedrine, phentermine, escitalopram,  phenylpropanolamine, selegiline, paroxetine, and sertraline    Parkinson's Disease Parkinson's disease is a movement disorder. It is a long-term condition that gets worse over time. Each person with Parkinson's disease is affected differently. This condition limits a person's ability to control movements and move the body normally. The condition can range from mild to severe. Parkinson's disease tends to get worse slowly over several years. What are the causes? Parkinson's disease is caused by a loss of brain cells (neurons) that make a brain chemical called dopamine. Dopamine is needed to control movement. As the condition gets worse, more neurons that make dopamine die. This makes it hard to move or control your  movements. The exact cause of the loss of neurons is not known. Genes and the environment may contribute to the cause of Parkinson's disease. What increases the risk? The following factors may make you more likely to develop this condition: Being male. Being age 39 or older. Having a family history of Parkinson's disease. Having had a traumatic brain injury. Having been exposed to toxins, such as pesticides. Having depression. What are the signs or symptoms? Symptoms of this condition can vary. The main symptoms are related to movement. These include: A tremor or shaking while you are resting. You cannot control the shaking. Stiffness in your arms and legs (rigidity). Slowing of movement. You may lose facial expressions and have trouble making small movements that are needed to button clothing or brush your teeth. An abnormal walk. You may walk with short, shuffling steps. Loss of balance and stability when standing. You may sway, fall backward, and have trouble making turns. Other symptoms include: Mental or cognitive changes, including: Depression or anxiety. Having false beliefs (delusions). Seeing, hearing, or feeling things that do not exist (hallucinations). Trouble speaking or swallowing. Changes in bowel or bladder functions, including constipation, having to go urgently or frequently, or not being able to control your bowel or bladder. Changes in sleep habits, acting out dreams, or trouble sleeping. Depending on the severity of the symptoms, Parkinson's disease may be mild, moderate, or advanced. Parkinson's disease progression is different for everyone. Some people may not progress to the advanced stage. Mild Parkinson's disease involves: Movement problems that do not affect daily activities. Movement problems on one side of the body. Moderate Parkinson's disease involves: Movement problems on both sides of the body. Slowing of movement. Coordination and balance  problems. Advanced Parkinson's disease involves: Extreme difficulty walking. Inability to live alone safely. Signs of dementia, such as having trouble remembering things, doing daily tasks such as getting dressed, and problem solving. How is this diagnosed? This condition is diagnosed by a specialist. A diagnosis may be made based on symptoms, your medical history, and a physical exam. You may also have brain imaging tests to check for loss of neurons in the brain. How is this treated? There is no cure for Parkinson's disease. Treatment focuses on managing your symptoms. Treatment may include: Medicines. Everyone responds to medicines differently. Your response may change over time. Work with your health care provider to find the best medicines for you. Speech, occupational, and physical therapy. Deep brain stimulation surgery to reduce tremors and other involuntary movements. Follow these instructions at home: Medicines Take over-the-counter and prescription medicines only as told by your health care provider. Avoid taking medicines that can affect thinking, such as pain or sleeping medicines. Eating and drinking Follow instructions  from your health care provider about eating or drinking restrictions. Do not drink alcohol. Activity Ask your health care provider if it is safe for you to drive. Do exercises as told by your health care provider or physical therapist. Lifestyle  Install grab bars and railings in your home to prevent falls. Do not use any products that contain nicotine or tobacco. These products include cigarettes, chewing tobacco, and vaping devices, such as e-cigarettes. If you need help quitting, ask your health care provider. Consider joining a support group for people with Parkinson's disease. General instructions Work with your health care provider to know the kind of day-to-day help that you may need and what to do to stay safe. Keep all follow-up visits. This is  important. Follow-up visits include any visits with a physical therapist, speech therapist, or occupational therapist. Where to find more information [employer] of Neurological Disorders and Stroke: [employer] [place]: www.parkinson.org Contact a health care provider if: Medicines do not help your symptoms. You are unsteady or have fallen at home. You need more support to function well at home. You have trouble swallowing. You have severe constipation. You are having problems with side effects from your medicines. You feel confused, anxious, depressed, or have hallucinations. Get help right away if you: Are injured after a fall. Cannot swallow without choking. Have chest pain or trouble breathing. Do not feel safe at home. Have thoughts about hurting yourself or others. These symptoms may represent a serious problem that is an emergency. Do not wait to see if the symptoms will go away. Get medical help right away. Call your local emergency services (911 in the [place] Do not drive yourself to the hospital. If you ever feel like you may hurt yourself or others, or have thoughts about taking your own life, get help right away. Go to your nearest emergency department or: Call your local emergency services (911 in the [place] Call a suicide crisis helpline, such as the [facility] at 670 536 0950 or 988 in the [place]. This is open 24 hours a day in the [place] Text the Crisis Text Line at 3214254254 (in the [place].). Summary Parkinson's disease is a long-term condition that gets worse over time. This condition limits your ability to control your movements and move your body normally. There is no cure for Parkinson's disease. Treatment focuses on managing your symptoms. Work with your health care provider to know the kind of day-to-day help that you may need and what to do to stay safe. Keep all follow-up visits, including any visits with a physical  therapist, speech therapist, or occupational therapist. This is important. This information is not intended to replace advice given to you by your health care provider. Make sure you discuss any questions you have with your health care provider. Document Revised: 04/22/2021 Document Reviewed: 01/12/2021 Elsevier Patient Education  2022 [employer]Spinal Stenosis Spinal stenosis is a condition that happens when the spinal canal narrows. The spinal canal is the space between the bones of your spine (vertebrae). This narrowing puts pressure on the spinal cord or nerves. Spinal stenosis can affect the vertebrae in the neck, upper back, and lower back. This condition can range from mild to severe. In some cases, there are no symptoms. What are the causes? This condition is caused by areas of bone pushing into the spinal canal. This condition may be present at birth (congenital), or it may be caused by: Slow breakdown of your vertebrae (spinal degeneration). This usually starts  around 79 years of age. Injury (trauma) to your spine. Tumors in your spine. Calcium deposits in your spine. What increases the risk? The following factors may make you more likely to develop this condition: Being older than age 26. Having a problem present at birth with an abnormally shaped spine (congenitalspinal deformity), such as scoliosis. Having arthritis. What are the signs or symptoms? Symptoms of this condition include: Pain in the neck or back that is generally worse with activities, particularly when you stand or walk. Numbness, tingling, hot or cold sensations, weakness, or tiredness (fatigue) in your leg or legs. Pain going from the buttock, down the thigh, and to the calf (sciatica). This can happen in one or both legs. Frequent episodes of falling. A foot-slapping gait that leads to muscle weakness. In more severe cases, you may develop: Problems having a bowel movement or urinating. Difficulty having  sex. Loss of feeling in your legs and inability to walk. ataxia Symptoms may come on slowly and get worse over time. In some cases, there are no symptoms. How is this diagnosed? This condition is diagnosed based on your medical history and a physical exam. You will also have tests, such as an MRI, a CT scan, or an X-ray. How is this treated? Treatment for this condition often focuses on managing your pain and any other symptoms. Treatment may include: Practicing good posture to lessen pressure on your nerves. Exercising to strengthen muscles, build endurance, improve balance, and maintain range of motion. This may include physical therapy to restore movement and strength to your back. Losing weight, if needed. Medicines to reduce inflammation or pain. This may include a medicine that is injected into your spine (steroidinjection). Assistive devices, such as a corset or brace. In some cases, surgery may be needed. The most common procedure is decompression laminectomy. This is done to remove excess bone that puts pressure on your nerve roots. Follow these instructions at home: Managing pain, stiffness, and swelling  Practice good posture. If you were given a brace or a corset, wear it as told by your health care provider. Maintain a healthy weight. Talk with your health care provider if you need help losing weight. If directed, apply heat to the affected area as often as told by your health care provider. Use the heat source that your health care provider recommends, such as a moist heat pack or a heating pad. Place a towel between your skin and the heat source. Leave the heat on for 20-30 minutes. Remove the heat if your skin turns bright red. This is especially important if you are unable to feel pain, heat, or cold. You may have a greater risk of getting burned. Activity Do all exercises and stretches as told by your health care provider. Do not do any activities that cause pain. Ask your  health care provider what activities are safe for you. Do not lift anything that is heavier than 10 lb (4.5 kg), or the limit that you are told by your health care provider. Return to your normal activities as told by your health care provider. Ask your health care provider what activities are safe for you. General instructions Take over-the-counter and prescription medicines only as told by your health care provider. Do not use any products that contain nicotine or tobacco, such as cigarettes, e-cigarettes, and chewing tobacco. If you need help quitting, ask your health care provider. Eat a healthy diet. This includes plenty of fruits and vegetables, whole grains, and low-fat (lean) protein.  Keep all follow-up visits as told by your health care provider. This is important. Contact a health care provider if: Your symptoms do not get better or they get worse. You have a fever. Get help right away if: You have new pain or symptoms of severe pain, such as: New or worsening pain in your neck or upper back. Severe pain that cannot be controlled with medicines. A severe headache that gets worse when you stand. You are dizzy. You have vision problems, such as blurred vision or double vision. You have nausea or you vomit. You develop new or worsening numbness or tingling in your back or legs. You have pain, redness, swelling, or warmth in your arm or leg. Summary Spinal stenosis is a condition that happens when the spinal canal narrows. The spinal canal is the space between the bones of your spine (vertebrae). This narrowing puts pressure on the spinal cord or nerves. This condition may be caused by a birth defect, breakdown of your vertebrae, trauma, tumors, or calcium deposits. Spinal stenosis can cause numbness, weakness, or pain in the buttocks, neck, back, and legs. This condition is usually diagnosed with your medical history, a physical exam, and tests, such as an MRI, a CT scan, or an  X-ray. This information is not intended to replace advice given to you by your health care provider. Make sure you discuss any questions you have with your health care provider. Document Revised: 07/26/2019 Document Reviewed: 07/26/2019 Elsevier Patient Education  2022 [place] is a condition that causes unsteadiness when walking and standing, poor coordination of body movements, and difficulty keeping a straight (upright) posture. A problem with the part of the brain that controls coordination and stability (cerebellum) can cause this condition or the neck in stenosis which affects the spinal cord.   Ataxia can develop later in life (acquired ataxia), during your 20s or 30s, or into your 60s or later. This type of ataxia develops when another medical condition, such as a stroke, damages the cerebellum. Ataxia also may be present early in life (non-acquired ataxia). There are two main types of non-acquired ataxia: Congenital. This type is present at birth. Hereditary. This type is passed from parent to child. The most common form of hereditary non-acquired ataxia is Friedreich ataxia. What are the causes? Acquired ataxia may be caused by: Changes in the nervous system (neurodegenerative changes). Changes throughout the body (systemic disorders). A lot of exposure to: Certain medicines, such as phenytoin and lithium. Solvents. These are cleaning fluids, such as paint thinner, nail polish remover, carpet cleaner, and degreasers. Alcohol abuse. Medical conditions, such as: Celiac disease. Hypothyroidism. A lack (deficiency) of vitamin E, vitamin B12, or thiamine. Brain tumors. Multiple sclerosis. Cerebral palsy. Stroke. Paraneoplastic syndromes. These are rare disorders triggered by the body's defense system (immune system) in response to a cancerous tumor. Viral infections. Head injury. Malnutrition. Congenital and hereditary ataxia are caused by problems that are  present in genes before birth. What are the signs or symptoms? Signs and symptoms of ataxia may vary, depending on the cause. They may include: Being unsteady. Walking with the legs wide apart to keep one's balance. Uncontrolled shaking (tremor). Poorly coordinated body movements. Difficulty maintaining an upright posture. Fatigue. Changes in speech, vision, or both. Involuntary eye movements. Difficulty swallowing. Difficulty writing. Muscle tightening that you cannot control (muscle spasms). How is this diagnosed? Ataxia may be diagnosed based on: Your personal and family medical history. A physical exam. Imaging tests, such as  a CT scan or MRI. Spinal tap (lumbar puncture). This procedure involves using a needle to take a sample of the fluid around your brain and spinal cord. Genetic testing. Blood work. How is this treated? The underlying condition that causes ataxia needs to be treated. If the cause is a brain tumor, you may need surgery. Treatment also focuses on improving your quality of life. This may involve: Physical therapy. This helps you to learn ways to improve coordination and move around more carefully. Occupational therapy. This helps you to improve your ability to do daily tasks, such as bathing and feeding yourself. Using devices to help you move around, eat, or communicate. These are called assistive devices, and they include a walker, modified eating utensils, and communication aids. Speech therapy. This helps you to learn ways to improve speech and swallowing. Follow these instructions at home: Preventing falls Lie down right away if you become very unsteady, dizzy, or nauseous, or if you feel like you are going to faint. Do not get up until all of those feelings pass. Keep your home well-lit. Use night-lights as needed. Remove tripping hazards, such as throw rugs, cords, and clutter. Install grab bars by the toilet and in the tub and shower. Use assistive  devices, such as a cane, walker, or wheelchair as needed to keep your balance. General instructions Do not drink alcohol. Ask your health care provider what activities are safe for you, and what activities you should avoid. Take over-the-counter and prescription medicines only as told by your health care provider. Contact a health care provider if: You have increasing unsteadiness or fall. You need equipment to help with walking, such as a cane or walker. You have difficulty with swallowing. Get help right away if: You have unsteadiness that suddenly worsens. You have any of these: Severe headaches. Chest pain. Abdominal pain. Weakness or numbness on one side of your body. Vision problems. Difficulty speaking. An irregular heartbeat. A very fast pulse. You feel confused. These symptoms may represent a serious problem that is an emergency. Do not wait to see if the symptoms will go away. Get medical help right away. Call your local emergency services (911 in the [place] Do not drive yourself to the hospital. Summary Ataxia is a condition that causes unsteadiness when walking and standing, poor coordination of body movements, and difficulty keeping a straight (upright) posture. Ataxia occurs because of a problem with the part of the brain that controls coordination and stability (cerebellum). The underlying condition that causes your ataxia needs to be treated. Treatment also focuses on improving your quality of life. This information is not intended to replace advice given to you by your health care provider. Make sure you discuss any questions you have with your health care provider. Document Revised: 09/21/2020 Document Reviewed: 09/21/2020 Elsevier Patient Education  2022 [employer].

## 2021-12-01 ENCOUNTER — Telehealth: Payer: Self-pay | Admitting: Neurology

## 2021-12-01 NOTE — Telephone Encounter (Signed)
HTA order sent to GI, [facility] they will reach out to the patient to schedule.  

## 2021-12-01 NOTE — Telephone Encounter (Signed)
Health team no auth req- sent to Nuclear medicine, they will reach out to the patient to schedule.

## 2021-12-03 ENCOUNTER — Telehealth: Payer: Self-pay | Admitting: Neurology

## 2021-12-03 LAB — MULTIPLE MYELOMA PANEL, SERUM
Albumin SerPl Elph-Mcnc: 4.1 g/dL (ref 2.9–4.4)
Albumin/Glob SerPl: 1.4 (ref 0.7–1.7)
Alpha 1: 0.2 g/dL (ref 0.0–0.4)
Alpha2 Glob SerPl Elph-Mcnc: 0.8 g/dL (ref 0.4–1.0)
B-Globulin SerPl Elph-Mcnc: 1.1 g/dL (ref 0.7–1.3)
Gamma Glob SerPl Elph-Mcnc: 1 g/dL (ref 0.4–1.8)
Globulin, Total: 3.1 g/dL (ref 2.2–3.9)
IgA/Immunoglobulin A, Serum: 109 mg/dL (ref 61–437)
IgG (Immunoglobin G), Serum: 894 mg/dL (ref 603–1613)
IgM (Immunoglobulin M), Srm: 101 mg/dL (ref 15–143)
Total Protein: 7.2 g/dL (ref 6.0–8.5)

## 2021-12-03 LAB — HEAVY METALS, BLOOD
Arsenic: 5 ug/L (ref 0–9)
Lead, Blood: <1 ug/dL (ref 0.0–3.4)
Mercury: 1.1 ug/L (ref 0.0–14.9)

## 2021-12-03 LAB — T4, FREE: Free T4: 0.91 ng/dL (ref 0.82–1.77)

## 2021-12-03 LAB — TSH: TSH: 1.95 u[IU]/mL (ref 0.450–4.500)

## 2021-12-03 LAB — SJOGREN'S SYNDROME ANTIBODS(SSA + SSB)
ENA SSA (RO) Ab: 0.2 AI (ref 0.0–0.9)
ENA SSB (LA) Ab: <0.2 AI (ref 0.0–0.9)

## 2021-12-03 NOTE — Procedures (Signed)
S PATIENT'S NAME:  Andrew Hayes, Andrew Hayes DOB:      [birth year]      MR#:    [id number]     DATE OF RECORDING: 11/30/2021 REFERRING M.D.:  Ward Givens, NP  Study Performed:   CPAP  Titration HISTORY: 81 year old male with a history of prostate cancer, NPH with shunt placement, migraine headaches, hyperlipidemia, diverticulosis, vertigo and a longstanding history of obstructive sleep apnea, currently on autoPAP, who presents for a full night titration study to optimize treatment. His residual AHI is 13.6/h on 7-13 cmH2O. The patient endorsed the Epworth Sleepiness Scale at 8 points. The patient's weight 191 pounds with a height of 71 (inches), resulting in a BMI of 26.9 kg/m2.   CURRENT MEDICATIONS: Lipitor, Vitamin D, Antivert, Multivitaimn, Maxalt-MLT, Tylenol, Lipitor  PROCEDURE:  This is a multichannel digital polysomnogram utilizing the SomnoStar 11.2 system.  Electrodes and sensors were applied and monitored per AASM Specifications.   EEG, EOG, Chin and Limb EMG, were sampled at 200 Hz.  ECG, Snore and Nasal Pressure, Thermal Airflow, Respiratory Effort, CPAP Flow and Pressure, Oximetry was sampled at 50 Hz. Digital video and audio were recorded.      The patient was fitted with a medium Simplus FFM. CPAP was initiated at 13 cmH20 with heated humidity per AASM standards and pressure was advanced to 14 cmH20 because of hypopneas, apneas and desaturations.  At a PAP pressure of 14 cmH20, there was a reduction of the AHI to 0.6/hour, with non-supine REM sleep achieved and O2 nadir of 88%. He had mostly central apneas, more so on a pressure of 13 cm.   Lights Out was at 22:26 and Lights On at 04:59. Total recording time (TRT) was 393 minutes, with a total sleep time (TST) of 310 minutes. The patient's sleep latency was 47 minutes, which is delayed. REM latency was 63.5 minutes, which is mildly reduced. The sleep efficiency was 78.9 %.    SLEEP ARCHITECTURE: WASO (Wake after sleep onset)  was 49 minutes  with mild to moderate sleep fragmentation noted. There were 28 minutes in Stage N1, 204.5 minutes Stage N2, 33.5 minutes Stage N3 and 44 minutes in Stage REM.  The percentage of Stage N1 was 9.%, which is increased, Stage N2 was 66.%, which is increased, Stage N3 was 10.8% and Stage R (REM sleep) was 14.2%, which is reduced. The arousals were noted as: 63 were spontaneous, 0 were associated with PLMs, 0 were associated with respiratory events.  RESPIRATORY ANALYSIS:  There was a total of 8 respiratory events: 0 obstructive apneas, 7 central apneas and 0 mixed apneas with a total of 7 apneas and an apnea index (AI) of 1.4 /hour. There were 1 hypopneas with a hypopnea index of .2/hour. The patient also had 0 respiratory event related arousals (RERAs).      The total APNEA/HYPOPNEA INDEX  (AHI) was 1.5 /hour and the total RESPIRATORY DISTURBANCE INDEX was 1.5 /hour  1 events occurred in REM sleep and 7 events in NREM. The REM AHI was 1.4 /hour versus a non-REM AHI of 1.6 /hour.  The patient spent 71.5 minutes of total sleep time in the supine position and 239 minutes in non-supine. The supine AHI was 5.8, versus a non-supine AHI of 0.3.  OXYGEN SATURATION & C02:  The baseline 02 saturation was 95%, with the lowest being 88%. Time spent below 89% saturation equaled 0 minutes.  PERIODIC LIMB MOVEMENTS:  The patient had a total of 0 Periodic Limb Movements. The Periodic  Limb Movement (PLM) index was 0 and the PLM Arousal index was 0 /hour.  Audio and video analysis did not show any abnormal or unusual movements, behaviors, phonations or vocalizations. The patient took no bathroom breaks. Snoring was reduced. The EKG was in keeping with normal sinus rhythm (NSR).  Post-study, the patient indicated that sleep was better than usual.   DIAGNOSIS:   Obstructive Sleep Apnea  Central Sleep Apnea  Dysfunctions associated with sleep stages or arousals from sleep  PLANS/RECOMMENDATIONS: [] Follow-up with  referring physician. [] Educate patient in sleep hygiene measures.   [] Maintain lean body weight. [] The patient should avoid evening sedatives, hypnotics, and alcohol beverage consumption CPAP therapy compliance is defined as 4 hours of nightly use.      DISCUSSION:   A follow up appointment will be scheduled in the Sleep Clinic at [facility].   Please call [phone] with any questions.      I certify that I have reviewed the entire raw data recording prior to the issuance of this report in accordance with the Standards of Accreditation of the American Academy of Sleep Medicine (AASM)      [] Larey Seat, M.D. Diplomat, Tax adviser of Psychiatry and Neurology  Diplomat, Tax adviser of Sleep Medicine Market researcher, [employer] [facility] [employer]  [] Star Age, MD,PhD Diplomat, Tax adviser of Neurology and Sleep Medicine ( Neurology and Sleep Medicine)  [] Richard Sater,MD,PhD Diplomat, American Board of Psychiatry and Neurology  Diplomat, Tax adviser of Sleep Medicine

## 2021-12-03 NOTE — Telephone Encounter (Signed)
Please disregard incomplete PAP titration report. I tried to addend, but it wouldn't let me delete the incomplete version.

## 2021-12-03 NOTE — Progress Notes (Signed)
S PATIENT'S NAME:  Andrew Hayes, Andrew Hayes DOB:      [birth year]      MR#:    [id number]     DATE OF RECORDING: 11/30/2021 REFERRING M.D.:  Ward Givens, NP  Study Performed:   CPAP  Titration HISTORY: 81 year old male with a history of prostate cancer, NPH with shunt placement, migraine headaches, hyperlipidemia, diverticulosis, vertigo and a longstanding history of obstructive sleep apnea, currently on autoPAP, who presents for a full night titration study to optimize treatment. His residual AHI is 13.6/h on 7-13 cmH2O. The patient endorsed the Epworth Sleepiness Scale at 8 points. The patient's weight 191 pounds with a height of 71 (inches), resulting in a BMI of 26.9 kg/m2.   CURRENT MEDICATIONS: Lipitor, Vitamin D, Antivert, Multivitaimn, Maxalt-MLT, Tylenol, Lipitor  PROCEDURE:  This is a multichannel digital polysomnogram utilizing the SomnoStar 11.2 system.  Electrodes and sensors were applied and monitored per AASM Specifications.   EEG, EOG, Chin and Limb EMG, were sampled at 200 Hz.  ECG, Snore and Nasal Pressure, Thermal Airflow, Respiratory Effort, CPAP Flow and Pressure, Oximetry was sampled at 50 Hz. Digital video and audio were recorded.      The patient was fitted with a medium Simplus FFM. CPAP was initiated at 13 cmH20 with heated humidity per AASM standards and pressure was advanced to 14 cmH20 because of hypopneas, apneas and desaturations.  At a PAP pressure of 14 cmH20, there was a reduction of the AHI to 0.6/hour, with non-supine REM sleep achieved and O2 nadir of 88%. He had mostly central apneas, more so on a pressure of 13 cm.   Lights Out was at 22:26 and Lights On at 04:59. Total recording time (TRT) was 393 minutes, with a total sleep time (TST) of 310 minutes. The patient's sleep latency was 47 minutes, which is delayed. REM latency was 63.5 minutes, which is mildly reduced. The sleep efficiency was 78.9 %.    SLEEP ARCHITECTURE: WASO (Wake after sleep onset)  was 49 minutes  with mild to moderate sleep fragmentation noted. There were 28 minutes in Stage N1, 204.5 minutes Stage N2, 33.5 minutes Stage N3 and 44 minutes in Stage REM.  The percentage of Stage N1 was 9.%, which is increased, Stage N2 was 66.%, which is increased, Stage N3 was 10.8% and Stage R (REM sleep) was 14.2%, which is reduced. The arousals were noted as: 63 were spontaneous, 0 were associated with PLMs, 0 were associated with respiratory events.  RESPIRATORY ANALYSIS:  There was a total of 8 respiratory events: 0 obstructive apneas, 7 central apneas and 0 mixed apneas with a total of 7 apneas and an apnea index (AI) of 1.4 /hour. There were 1 hypopneas with a hypopnea index of .2/hour. The patient also had 0 respiratory event related arousals (RERAs).      The total APNEA/HYPOPNEA INDEX  (AHI) was 1.5 /hour and the total RESPIRATORY DISTURBANCE INDEX was 1.5 /hour  1 events occurred in REM sleep and 7 events in NREM. The REM AHI was 1.4 /hour versus a non-REM AHI of 1.6 /hour.  The patient spent 71.5 minutes of total sleep time in the supine position and 239 minutes in non-supine. The supine AHI was 5.8, versus a non-supine AHI of 0.3.  OXYGEN SATURATION & C02:  The baseline 02 saturation was 95%, with the lowest being 88%. Time spent below 89% saturation equaled 0 minutes.  PERIODIC LIMB MOVEMENTS:  The patient had a total of 0 Periodic Limb Movements. The Periodic  Limb Movement (PLM) index was 0 and the PLM Arousal index was 0 /hour.  Audio and video analysis did not show any abnormal or unusual movements, behaviors, phonations or vocalizations. The patient took no bathroom breaks. Snoring was reduced. The EKG was in keeping with normal sinus rhythm (NSR).  Post-study, the patient indicated that sleep was better than usual.   DIAGNOSIS:   Obstructive Sleep Apnea  Central Sleep Apnea  Dysfunctions associated with sleep stages or arousals from sleep  PLANS/RECOMMENDATIONS: This study shows  improvement of his sleep disordered breathing with CPAP of 14 cm. He did have central apneas on a pressure of 13 cm. I would recommend switching his autoPAP to a set pressure of 14 cm via medium Simplus FFM.  The patient should be reminded to be fully compliant with PAP therapy to improve sleep related symptoms and decrease long term cardiovascular risks. Please note that untreated obstructive sleep apnea may carry additional perioperative morbidity. Patients with significant obstructive sleep apnea should receive perioperative PAP therapy and the surgeons and particularly the anesthesiologist should be informed of the diagnosis and the severity of the sleep disordered breathing. This study shows sleep fragmentation and abnormal sleep stage percentages; these are nonspecific findings and per se do not signify an intrinsic sleep disorder or a cause for the patient's sleep-related symptoms. Causes include (but are not limited to) the first night effect of the sleep study, circadian rhythm disturbances, medication effect or an underlying mood disorder or medical problem.  The patient should be cautioned not to drive, work at heights, or operate dangerous or heavy equipment when tired or sleepy. Review and reiteration of good sleep hygiene measures should be pursued with any patient. The patient will be seen in follow-up in the sleep clinic at [facility] for discussion of the test results, symptom and treatment compliance review, further management strategies, etc. The referring provider will be notified of the test results.  I certify that I have reviewed the entire raw data recording prior to the issuance of this report in accordance with the Standards of Accreditation of the [place] Academy of Sleep Medicine (AASM)   Star Age, MD,PhD Diplomat, American Board of Neurology and Sleep Medicine ( Neurology and Sleep Medicine)

## 2021-12-04 DIAGNOSIS — G4733 Obstructive sleep apnea (adult) (pediatric): Secondary | ICD-10-CM | POA: Diagnosis not present

## 2021-12-09 DIAGNOSIS — M2560 Stiffness of unspecified joint, not elsewhere classified: Secondary | ICD-10-CM | POA: Diagnosis not present

## 2021-12-09 DIAGNOSIS — G629 Polyneuropathy, unspecified: Secondary | ICD-10-CM | POA: Diagnosis not present

## 2021-12-09 DIAGNOSIS — R030 Elevated blood-pressure reading, without diagnosis of hypertension: Secondary | ICD-10-CM | POA: Diagnosis not present

## 2021-12-09 DIAGNOSIS — R0789 Other chest pain: Secondary | ICD-10-CM | POA: Diagnosis not present

## 2021-12-18 ENCOUNTER — Encounter (HOSPITAL_COMMUNITY)
Admission: RE | Admit: 2021-12-18 | Discharge: 2021-12-18 | Disposition: A | Discharge status: Home / Self Care | Payer: PPO | Source: Ambulatory Visit | Attending: Neurology | Admitting: Neurology

## 2021-12-18 ENCOUNTER — Other Ambulatory Visit: Payer: Self-pay

## 2021-12-18 DIAGNOSIS — R29898 Other symptoms and signs involving the musculoskeletal system: Secondary | ICD-10-CM | POA: Diagnosis not present

## 2021-12-18 DIAGNOSIS — G20C Parkinsonism, unspecified: Secondary | ICD-10-CM

## 2021-12-18 DIAGNOSIS — R29818 Other symptoms and signs involving the nervous system: Secondary | ICD-10-CM | POA: Insufficient documentation

## 2021-12-18 DIAGNOSIS — R251 Tremor, unspecified: Secondary | ICD-10-CM

## 2021-12-18 DIAGNOSIS — G919 Hydrocephalus, unspecified: Secondary | ICD-10-CM | POA: Diagnosis not present

## 2021-12-18 DIAGNOSIS — R269 Unspecified abnormalities of gait and mobility: Secondary | ICD-10-CM

## 2021-12-18 DIAGNOSIS — M6289 Other specified disorders of muscle: Secondary | ICD-10-CM

## 2021-12-18 DIAGNOSIS — Z820 Family history of epilepsy and other diseases of the nervous system: Secondary | ICD-10-CM

## 2021-12-18 DIAGNOSIS — G250 Essential tremor: Secondary | ICD-10-CM | POA: Diagnosis not present

## 2021-12-18 DIAGNOSIS — Z982 Presence of cerebrospinal fluid drainage device: Secondary | ICD-10-CM | POA: Diagnosis not present

## 2021-12-18 DIAGNOSIS — G20 Parkinson's disease: Secondary | ICD-10-CM | POA: Insufficient documentation

## 2021-12-18 MED ORDER — POTASSIUM IODIDE (ANTIDOTE) 130 MG PO TABS: 130.0000 mg | ORAL_TABLET | Freq: Once | ORAL | Status: DC

## 2021-12-18 MED ORDER — IOFLUPANE I 123 185 MBQ/2.5ML IV SOLN
4.7000 | Freq: Once | INTRAVENOUS | Status: AC | PRN
  Administered 2021-12-18: 4.7 via INTRAVENOUS

## 2021-12-18 MED ORDER — POTASSIUM IODIDE (ANTIDOTE) 130 MG PO TABS
ORAL_TABLET | ORAL | Status: AC
  Filled 2021-12-18: qty 1

## 2021-12-24 ENCOUNTER — Telehealth: Payer: Self-pay | Admitting: Adult Health

## 2021-12-24 DIAGNOSIS — G4733 Obstructive sleep apnea (adult) (pediatric): Secondary | ICD-10-CM

## 2021-12-24 NOTE — Telephone Encounter (Signed)
Pt would like call from the nurse about sleep study results. Had sleep lab on 11/30/21. ?

## 2021-12-25 NOTE — Telephone Encounter (Signed)
I can see the report under procedures. Not sure if it's only visible to me? ? ?

## 2021-12-27 DIAGNOSIS — G4733 Obstructive sleep apnea (adult) (pediatric): Secondary | ICD-10-CM | POA: Diagnosis not present

## 2021-12-28 DIAGNOSIS — J392 Other diseases of pharynx: Secondary | ICD-10-CM | POA: Diagnosis not present

## 2021-12-28 DIAGNOSIS — R030 Elevated blood-pressure reading, without diagnosis of hypertension: Secondary | ICD-10-CM | POA: Diagnosis not present

## 2021-12-28 DIAGNOSIS — R0982 Postnasal drip: Secondary | ICD-10-CM | POA: Diagnosis not present

## 2021-12-28 NOTE — Telephone Encounter (Signed)
Please advise patient that I could not see his report so I had to wait for Dr. Rexene Alberts to return. I apologize for the delay. ? ?He does have sleep apnea.  We will be changing his settings from AutoSet to a set pressure of 14 cm of water to see if we can get better resolution of apnea.  These orders have been sent to his DME. ?

## 2021-12-28 NOTE — Telephone Encounter (Signed)
Since not sure what is going on, I cut and pasted below what I wrote in the report: ? ?<<DIAGNOSIS:  ?  ?Obstructive Sleep Apnea  ?Central Sleep Apnea  ?Dysfunctions associated with sleep stages or arousals from sleep ?  ?PLANS/RECOMMENDATIONS: ?This study shows improvement of his sleep disordered breathing with CPAP of 14 cm. He did have central apneas on a pressure of 13 cm. I would recommend switching his autoPAP to a set pressure of 14 cm via medium Simplus FFM.  ?The patient should be reminded to be fully compliant with PAP therapy to improve sleep related symptoms and decrease long term cardiovascular risks. Please note that untreated obstructive sleep apnea may carry additional perioperative morbidity. Patients with significant obstructive sleep apnea should receive perioperative PAP therapy and the surgeons and particularly the anesthesiologist should be informed of the diagnosis and the severity of the sleep disordered breathing. ?This study shows sleep fragmentation and abnormal sleep stage percentages; these are nonspecific findings and per se do not signify an intrinsic sleep disorder or a cause for the patient's sleep-related symptoms. Causes include (but are not limited to) the first night effect of the sleep study, circadian rhythm disturbances, medication effect or an underlying mood disorder or medical problem.  ?The patient should be cautioned not to drive, work at heights, or operate dangerous or heavy equipment when tired or sleepy. Review and reiteration of good sleep hygiene measures should be pursued with any patient. ?The patient will be seen in follow-up in the sleep clinic at [facility] for discussion of the test results, symptom and treatment compliance review, further management strategies, etc. The referring provider will be notified of the test results.>> ?  ?

## 2021-12-28 NOTE — Addendum Note (Signed)
Addended by: Trudie Buckler on: 12/28/2021 03:15 PM ? ? Modules accepted: Orders ? ?

## 2022-01-15 DIAGNOSIS — G4733 Obstructive sleep apnea (adult) (pediatric): Secondary | ICD-10-CM | POA: Diagnosis not present

## 2022-01-20 DIAGNOSIS — D485 Neoplasm of uncertain behavior of skin: Secondary | ICD-10-CM | POA: Diagnosis not present

## 2022-01-20 DIAGNOSIS — L570 Actinic keratosis: Secondary | ICD-10-CM | POA: Diagnosis not present

## 2022-01-20 DIAGNOSIS — L821 Other seborrheic keratosis: Secondary | ICD-10-CM | POA: Diagnosis not present

## 2022-01-20 DIAGNOSIS — D225 Melanocytic nevi of trunk: Secondary | ICD-10-CM | POA: Diagnosis not present

## 2022-01-20 DIAGNOSIS — L578 Other skin changes due to chronic exposure to nonionizing radiation: Secondary | ICD-10-CM | POA: Diagnosis not present

## 2022-01-20 DIAGNOSIS — L710 Perioral dermatitis: Secondary | ICD-10-CM | POA: Diagnosis not present

## 2022-01-20 DIAGNOSIS — D0472 Carcinoma in situ of skin of left lower limb, including hip: Secondary | ICD-10-CM | POA: Diagnosis not present

## 2022-01-20 DIAGNOSIS — Z86018 Personal history of other benign neoplasm: Secondary | ICD-10-CM | POA: Diagnosis not present

## 2022-01-20 DIAGNOSIS — Z85828 Personal history of other malignant neoplasm of skin: Secondary | ICD-10-CM | POA: Diagnosis not present

## 2022-01-27 ENCOUNTER — Ambulatory Visit: Payer: PPO | Admitting: Adult Health

## 2022-01-27 DIAGNOSIS — G4733 Obstructive sleep apnea (adult) (pediatric): Secondary | ICD-10-CM | POA: Diagnosis not present

## 2022-02-24 DIAGNOSIS — D0472 Carcinoma in situ of skin of left lower limb, including hip: Secondary | ICD-10-CM | POA: Diagnosis not present

## 2022-02-24 DIAGNOSIS — R030 Elevated blood-pressure reading, without diagnosis of hypertension: Secondary | ICD-10-CM | POA: Diagnosis not present

## 2022-02-26 DIAGNOSIS — G4733 Obstructive sleep apnea (adult) (pediatric): Secondary | ICD-10-CM | POA: Diagnosis not present

## 2022-03-29 DIAGNOSIS — G4733 Obstructive sleep apnea (adult) (pediatric): Secondary | ICD-10-CM | POA: Diagnosis not present

## 2022-04-15 DIAGNOSIS — G4733 Obstructive sleep apnea (adult) (pediatric): Secondary | ICD-10-CM | POA: Diagnosis not present

## 2022-04-20 NOTE — Progress Notes (Unsigned)
PATIENT: Andrew Hayes DOB: 13-Sep-1941  REASON FOR VISIT: follow up HISTORY FROM: patient  Chief Complaint  Patient presents with   Follow-up    Rm 18 alone here for CPAP f/u. Reports he has been doing well. Has changed his masks a few times and the nasal mask works the best.      HISTORY OF PRESENT ILLNESS: Today 04/21/22:  Andrew Hayes is a 81 year old male with a history of OSA on CPAP.Reports that he has tried several mask and the nasal mask works best. At the last visit I sent order for pressure to be changed to autoset 7-15 but the pressure was never changed.   Reports that he gets vestibular migraines about 3-4 times a year.  Always gets vertigo and nauseous but not typically a headache. Has tried maxalt but not helpful.   Neuropathy: feels like he is walking sponges. No pain. Does therapy at home to help with his balance. No falls. Had NCV/EMG in 2016.    10/13/21: Andrew Hayes is a 81 year old male with a history of obstructive sleep apnea on CPAP.  He reports that he has had a hard time adjusting to the new machine.  Reports that his residual AHI is always high.  I will take data from August to September of 2022 it appears he uses machine 23 out of 38 days for compliance of 61%.  He uses machine greater than 4 hours 16 days.  Average usage is 5 hours and 1 minute.  His residual AHI is 13.6 on 7-13 cmH2O with max pressure at 13.4 L/min.  No significant leak.  I reviewed data from December to January.  He has used the machine only 4 days.  His residual AHI is 19.8 on 7 to 13 cm of water.  The patient has also been using his old machine I would like that data from December to January patient uses machine 23 out of 30 days for compliance of 77%.  He uses machine on average 6 hours and 7 minutes.  His residual AHI is 13.4 on 10 cm of water.   01/22/20:Andrew Hayes is a 81 year old male with a history of OSA on CPAP and vestibular migraines. Reports CPAP works well for him.  No issues.   Migraines: since shunt was turned off not having migraines.   Vestibular migraines: reports that he has not had a severe episode. Occasionally will have some dizziness but episode does not last long.  Balance issues: patient continues to report balance issues. No falls. Feels like his feet are sponges. Has tried PT. Feels that he has neuropathy. Denies numbness and tingling. Has had NCV/EMG in 2016 that was normal.     HISTORY:   02/05/20:   Andrew Hayes is a 81 year old male with a history of vestibular migraines and normal pressure hydrocephalus post shunting.  He saw Dr. Johnsie Cancel last week and his shunt was adjusted from her drainage.  He reports that since then his headaches have increased.  He states that he has a headache every day typically on the right side of the head in the frontal region.  He reports the worst his pain is been is a 7-8/10.  Currently it is 4 out of 10.  He reports that he has been taking extra strength Tylenol on a daily basis.  Reports that his headache does improve within 30 to 40 minutes if he lays down.  He reports with activity the headache seems to come  back.  He reports initially after having the shunt if he coughed had a bowel movement or lifting object he felt more pressure in the brain.  He reports that his hearing has also worsened on the right side.  He reports that he is having mild dizziness with his headaches.  With more severe dizziness he does have nausea and vomiting.  Denies photophobia and phonophobia.  He returns today for an evaluation.  REVIEW OF SYSTEMS: Out of a complete 14 system review of symptoms, the patient complains only of the following symptoms, and all other reviewed systems are negative.   ESS 6  ALLERGIES: Allergies  Allergen Reactions   Percocet [Oxycodone-Acetaminophen] Other (See Comments)    Patient states he is unable to tolerate    HOME MEDICATIONS: Outpatient Medications Prior to Visit  Medication Sig  Dispense Refill   atorvastatin (LIPITOR) 20 MG tablet Take 20 mg by mouth daily.     Cholecalciferol (VITAMIN D) 50 MCG (2000 UT) CAPS Take by mouth.     Doxylamine Succinate, Sleep, (UNISOM PO) Take by mouth at bedtime as needed.     gabapentin (NEURONTIN) 100 MG capsule 1-3 capsule     Multiple Vitamin (MULTIVITAMIN PO) Take by mouth.     rizatriptan (MAXALT-MLT) 10 MG disintegrating tablet Take 1 tablet (10 mg total) by mouth as needed for migraine. May repeat in 2 hours if needed 10 tablet 11   UNABLE TO FIND Med Name: supplement for neuropathy     No facility-administered medications prior to visit.    PAST MEDICAL HISTORY: Past Medical History:  Diagnosis Date   Actinic keratoses    Adenomatous polyp of colon 2006   Arthritis    Diverticulosis    Mild   Elevated fasting glucose    Mildly   Herniated disc    Junctional nevus    Lipidemia    Mixed   Lyme disease 2020   pt placed on abx and stated he stopped taking vitamins so as not to interfere.   Mild sleep apnea    CPAP   Organic impotence    Plantar fasciitis    Pneumonia 1966   Prostate cancer [facility])    Vestibular migraine     PAST SURGICAL HISTORY: Past Surgical History:  Procedure Laterality Date   APPENDECTOMY     COLONOSCOPY     ELBOW SURGERY Left 2013   KNEE ARTHROSCOPY Bilateral 2013   knee and elbow   PLACEMENT OF LUMBAR DRAIN N/A 09/17/2019   Procedure: PLACEMENT OF LUMBAR DRAIN;  Surgeon: Jadene Pierini, MD;  Location: [facility] OR;  Service: Neurosurgery;  Laterality: N/A;  Lumbar drain placement   PROSTATECTOMY  1986   TONSILLECTOMY     VENTRICULOPERITONEAL SHUNT Right 01/07/2020   Procedure: Right ventriculoperitoneal shunt placement;  Surgeon: Jadene Pierini, MD;  Location: [facility] OR;  Service: Neurosurgery;  Laterality: Right;    FAMILY HISTORY: Family History  Problem Relation Age of Onset   Stroke Mother    Hypertension Mother    Parkinson's disease Mother    COPD Father    Parkinson's  disease Sister    Congestive Heart Failure Sister    Dementia Brother    Hydrocephalus Brother    Parkinson's disease Brother    Heart attack Maternal Grandfather        50's   Other Paternal Grandmother        chilbirth   Heart attack Paternal Grandfather        4's  Heart disease Daughter        deceased at 16 month   Colon cancer Neg Hx    Migraines Neg Hx        none to his knowledge   Neuropathy Neg Hx     SOCIAL HISTORY: Social History   Socioeconomic History   Marital status: Married    Spouse name: Steward Drone   Number of children: 2   Years of education: 16   Highest education level: Not on file  Occupational History   Occupation: Social worker: RETIRED    Comment: retired  Tobacco Use   Smoking status: Former    Packs/day: 2.00    Years: 24.00    Total pack years: 48.00    Types: Cigarettes    Quit date: 10/11/1984    Years since quitting: 37.5   Smokeless tobacco: Never   Tobacco comments:    briefly used smokeless tobacco  Vaping Use   Vaping Use: Never used  Substance and Sexual Activity   Alcohol use: Not Currently    Alcohol/week: 4.0 standard drinks of alcohol    Types: 4 Cans of beer per week    Comment: 1 beer per day at most, 5/week   Drug use: No   Sexual activity: Not on file  Other Topics Concern   Not on file  Social History Narrative   Patient lives at home with family.   Patient consumes about 2 cups of caffeine daily,   right handed.   2 children (1 deceased)   Social Determinants of Health   Financial Resource Strain: Not on file  Food Insecurity: Not on file  Transportation Needs: Not on file  Physical Activity: Not on file  Stress: Not on file  Social Connections: Not on file  Intimate Partner Violence: Not on file      PHYSICAL EXAM  Vitals:   04/21/22 0953  BP: (!) 155/77  Pulse: (!) 58  Weight: 188 lb (85.3 kg)  Height: 5\' 10"  (1.778 m)   Body mass index is 26.98 kg/m.  Generalized: Well developed, in no  acute distress  Chest: Lungs clear to auscultation bilaterally  Neurological examination  Mentation: Alert oriented to time, place, history taking. Follows all commands speech and language fluent Cranial nerve II-XII: Extraocular movements were full, visual field were full on confrontational test Head turning and shoulder shrug  were normal and symmetric. Neck circumference 16 inches Mallampati 3 + Motor: The motor testing reveals 5 over 5 strength of all 4 extremities. Good symmetric motor tone is noted throughout.  Sensory: Sensory testing is intact to soft touch on all 4 extremities. No evidence of extinction is noted. Decreased pinprick in stocking like pattern: sharper at knees bilaterally. Vibration sensation decreased in feet.  Gait and station: Gait is normal.    DIAGNOSTIC DATA (LABS, IMAGING, TESTING) - I reviewed patient records, labs, notes, testing and imaging myself where available.  Lab Results  Component Value Date   WBC 5.5 06/23/2020   HGB 15.1 06/23/2020   HCT 44.1 06/23/2020   MCV 96.1 06/23/2020   PLT 230.0 06/23/2020      Component Value Date/Time   NA 140 06/23/2020 0936   NA 143 05/21/2019 0940   K 4.0 06/23/2020 0936   CL 105 06/23/2020 0936   CO2 25 06/23/2020 0936   GLUCOSE 104 (H) 06/23/2020 0936   BUN 21 06/23/2020 0936   BUN 21 05/21/2019 0940   CREATININE 1.05 06/23/2020 0936  CALCIUM 9.4 06/23/2020 0936   PROT 7.2 11/30/2021 0932   ALBUMIN 4.7 06/23/2020 0936   AST 25 06/23/2020 0936   ALT 20 06/23/2020 0936   ALKPHOS 84 06/23/2020 0936   BILITOT 2.1 (H) 07/09/2020 1332   GFRNONAA >60 01/07/2020 1932   GFRAA >60 01/07/2020 1932    Lab Results  Component Value Date   VITAMINB12 1,197 08/15/2019   Lab Results  Component Value Date   TSH 1.950 11/30/2021      ASSESSMENT AND PLAN 81 y.o. year old male  has a past medical history of Actinic keratoses, Adenomatous polyp of colon (2006), Arthritis, Diverticulosis, Elevated fasting  glucose, Herniated disc, Junctional nevus, Lipidemia, Lyme disease (2020), Mild sleep apnea, Organic impotence, Plantar fasciitis, Pneumonia (1966), Prostate cancer (HCC), and Vestibular migraine. here with:  OSA on CPAP  - CPAP compliance good -Residual AHI remains elevated -We will increase pressure 5-16 cmH2O - Encourage patient to use CPAP nightly and > 4 hours each night -Patient will contact us in 1 month for repeat download  2. Vestibular migraines/ Vertigo  - Zofran 5 mg every 8 hours PRN for nausea  3. Neuropathy  - Stable  - On gabapentin prescribed by PCP      - F/U in 1 year or sooner if needed    Butch Penny, MSN, NP-C 04/21/2022, 10:09 AM [facility] [address] [place], [place] [address] [phone]

## 2022-04-21 ENCOUNTER — Ambulatory Visit (INDEPENDENT_AMBULATORY_CARE_PROVIDER_SITE_OTHER): Payer: PPO | Admitting: Adult Health

## 2022-04-21 ENCOUNTER — Encounter: Payer: Self-pay | Admitting: Adult Health

## 2022-04-21 VITALS — BP 155/77 | HR 58 | Ht 70.0 in | Wt 188.0 lb

## 2022-04-21 DIAGNOSIS — G4733 Obstructive sleep apnea (adult) (pediatric): Secondary | ICD-10-CM

## 2022-04-21 DIAGNOSIS — G629 Polyneuropathy, unspecified: Secondary | ICD-10-CM

## 2022-04-21 DIAGNOSIS — Z9989 Dependence on other enabling machines and devices: Secondary | ICD-10-CM | POA: Diagnosis not present

## 2022-04-21 DIAGNOSIS — R42 Dizziness and giddiness: Secondary | ICD-10-CM | POA: Diagnosis not present

## 2022-04-21 MED ORDER — ONDANSETRON HCL 4 MG PO TABS: 4.0000 mg | ORAL_TABLET | Freq: Three times a day (TID) | ORAL | 0 refills | Status: AC | PRN

## 2022-04-21 MED ORDER — RIZATRIPTAN BENZOATE 10 MG PO TBDP: 10.0000 mg | ORAL_TABLET | ORAL | 3 refills | Status: DC | PRN

## 2022-04-21 NOTE — Patient Instructions (Addendum)
Your Plan:  Continue using CPAP Change pressure autoset 5-16 Call in 30-45 days for [place] to look at another download If your symptoms worsen or you develop new symptoms please let us know.     Zofran ordered for nausea when you get vertigo If your symptoms worsen or you develop new symptoms please let us know.   Ondansetron Tablets What is this medication? ONDANSETRON (on DAN se tron) prevents nausea and vomiting from chemotherapy, radiation, or surgery. It works by blocking substances in the body that may cause nausea or vomiting. It belongs to a group of medications called antiemetics. This medicine may be used for other purposes; ask your health care provider or pharmacist if you have questions. COMMON BRAND NAME(S): Zofran What should I tell my care team before I take this medication? They need to know if you have any of these conditions: Heart disease History of irregular heartbeat Liver disease Low levels of magnesium or potassium in the blood An unusual or allergic reaction to ondansetron, granisetron, other medications, foods, dyes, or preservatives Pregnant or trying to get pregnant Breast-feeding How should I use this medication? Take this medication by mouth with a glass of water. Follow the directions on your prescription label. Take your doses at regular intervals. Do not take your medication more often than directed. Talk to your care team regarding the use of this medication in children. Special care may be needed. Overdosage: If you think you have taken too much of this medicine contact a poison control center or emergency room at once. NOTE: This medicine is only for you. Do not share this medicine with others. What if I miss a dose? If you miss a dose, take it as soon as you can. If it is almost time for your next dose, take only that dose. Do not take double or extra doses. What may interact with this medication? Do not take this medication with any of the  following: Apomorphine Certain medications for fungal infections like fluconazole, itraconazole, ketoconazole, posaconazole, voriconazole Cisapride Dronedarone Pimozide Thioridazine This medication may also interact with the following: Carbamazepine Certain medications for depression, anxiety, or psychotic disturbances Fentanyl Linezolid MAOIs like Carbex, Eldepryl, Marplan, Nardil, and Parnate Methylene blue (injected into a vein) Other medications that prolong the QT interval (cause an abnormal heart rhythm) like dofetilide, ziprasidone Phenytoin Rifampicin Tramadol This list may not describe all possible interactions. Give your health care provider a list of all the medicines, herbs, non-prescription drugs, or dietary supplements you use. Also tell them if you smoke, drink alcohol, or use illegal drugs. Some items may interact with your medicine. What should I watch for while using this medication? Check with your care team right away if you have any sign of an allergic reaction. What side effects may I notice from receiving this medication? Side effects that you should report to your care team as soon as possible: Allergic reactions--skin rash, itching, hives, swelling of the face, lips, tongue, or throat Bowel blockage--stomach cramping, unable to have a bowel movement or pass gas, loss of appetite, vomiting Chest pain (angina)--pain, pressure, or tightness in the chest, neck, back, or arms Heart rhythm changes--fast or irregular heartbeat, dizziness, feeling faint or lightheaded, chest pain, trouble breathing Irritability, confusion, fast or irregular heartbeat, muscle stiffness, twitching muscles, sweating, high fever, seizure, chills, vomiting, diarrhea, which may be signs of serotonin syndrome Side effects that usually do not require medical attention (report to your care team if they continue or are bothersome): Constipation Diarrhea  General discomfort and  fatigue Headache This list may not describe all possible side effects. Call your doctor for medical advice about side effects. You may report side effects to FDA at 1-800-FDA-1088. Where should I keep my medication? Keep out of the reach of children and pets. Store between 2 and 30 degrees C (36 and 86 degrees F). Throw away any unused medication after the expiration date. NOTE: This sheet is a summary. It may not cover all possible information. If you have questions about this medicine, talk to your doctor, pharmacist, or health care provider.  2023 Elsevier/Gold Standard (2020-12-09 00:00:00)  Thank you for coming to see [place] at [facility]. I hope we have been able to provide you high quality care today.  You may receive a patient satisfaction survey over the next few weeks. We would appreciate your feedback and comments so that we may continue to improve ourselves and the health of our patients.

## 2022-04-28 DIAGNOSIS — G4733 Obstructive sleep apnea (adult) (pediatric): Secondary | ICD-10-CM | POA: Diagnosis not present

## 2022-05-24 DIAGNOSIS — Z8546 Personal history of malignant neoplasm of prostate: Secondary | ICD-10-CM | POA: Diagnosis not present

## 2022-05-24 DIAGNOSIS — E7800 Pure hypercholesterolemia, unspecified: Secondary | ICD-10-CM | POA: Diagnosis not present

## 2022-05-24 DIAGNOSIS — R7303 Prediabetes: Secondary | ICD-10-CM | POA: Diagnosis not present

## 2022-05-26 DIAGNOSIS — Z0000 Encounter for general adult medical examination without abnormal findings: Secondary | ICD-10-CM | POA: Diagnosis not present

## 2022-05-26 DIAGNOSIS — E7800 Pure hypercholesterolemia, unspecified: Secondary | ICD-10-CM | POA: Diagnosis not present

## 2022-05-26 DIAGNOSIS — G629 Polyneuropathy, unspecified: Secondary | ICD-10-CM | POA: Diagnosis not present

## 2022-05-26 DIAGNOSIS — Z79899 Other long term (current) drug therapy: Secondary | ICD-10-CM | POA: Diagnosis not present

## 2022-05-26 DIAGNOSIS — R030 Elevated blood-pressure reading, without diagnosis of hypertension: Secondary | ICD-10-CM | POA: Diagnosis not present

## 2022-05-26 DIAGNOSIS — Z982 Presence of cerebrospinal fluid drainage device: Secondary | ICD-10-CM | POA: Diagnosis not present

## 2022-05-26 DIAGNOSIS — R7303 Prediabetes: Secondary | ICD-10-CM | POA: Diagnosis not present

## 2022-05-26 DIAGNOSIS — Z8546 Personal history of malignant neoplasm of prostate: Secondary | ICD-10-CM | POA: Diagnosis not present

## 2022-05-28 DIAGNOSIS — H524 Presbyopia: Secondary | ICD-10-CM | POA: Diagnosis not present

## 2022-05-28 DIAGNOSIS — H52223 Regular astigmatism, bilateral: Secondary | ICD-10-CM | POA: Diagnosis not present

## 2022-05-28 DIAGNOSIS — H5212 Myopia, left eye: Secondary | ICD-10-CM | POA: Diagnosis not present

## 2022-05-28 DIAGNOSIS — H40013 Open angle with borderline findings, low risk, bilateral: Secondary | ICD-10-CM | POA: Diagnosis not present

## 2022-05-29 DIAGNOSIS — G4733 Obstructive sleep apnea (adult) (pediatric): Secondary | ICD-10-CM | POA: Diagnosis not present

## 2022-06-29 ENCOUNTER — Encounter: Payer: Self-pay | Admitting: Adult Health

## 2022-06-29 DIAGNOSIS — G4733 Obstructive sleep apnea (adult) (pediatric): Secondary | ICD-10-CM | POA: Diagnosis not present

## 2022-06-29 NOTE — Telephone Encounter (Signed)
AHI still elevated. Recommend doing a set pressure of 11 with EPR 2 if amendable.

## 2022-07-13 ENCOUNTER — Telehealth: Payer: Self-pay | Admitting: Neurology

## 2022-07-13 DIAGNOSIS — R42 Dizziness and giddiness: Secondary | ICD-10-CM

## 2022-07-13 NOTE — Telephone Encounter (Signed)
Pt has had a headache for 10 days and is needing to speak to the RN to see if he can get any kind of medication before his scheduled appt. Please advise.

## 2022-07-13 NOTE — Telephone Encounter (Signed)
LMVM for pt to return call.   

## 2022-07-13 NOTE — Telephone Encounter (Signed)
Pt is returning a call from nurse. Requesting a call back.  

## 2022-07-14 DIAGNOSIS — G4733 Obstructive sleep apnea (adult) (pediatric): Secondary | ICD-10-CM | POA: Diagnosis not present

## 2022-07-14 NOTE — Addendum Note (Signed)
Addended by: Gildardo Griffes on: 07/14/2022 11:53 AM   Modules accepted: Orders

## 2022-07-14 NOTE — Telephone Encounter (Signed)
Spoke with patient. He states he is actually feeling better today but he has had a 1.5 week spell of his vestibular migraines, which may be his longest running spell to date. He has had nausea, vertigo, weakness, but no headache. He also notes problems with his balance. He states unless he gets worse he will plan to see [place] at his appointment next Wed 10/11. He wants to discuss potentially doing PT. He said his wife tells him he walks like an old man. He has to take short steps in order to compensate for his unsteady feeling. He states he has not fallen nor has he come close. Denies any weakness in his legs. He also reports he has a contributing factor of neuropathy in his feet like a sponge feeling. He assured he was feeling better today and will continue to monitor. I advised if he gets acutely worse to go to the hospital for evaluation. He verbalized appreciation for the call.

## 2022-07-14 NOTE — Telephone Encounter (Signed)
Melvenia Beam, MD  [facility] Calls 9 minutes ago (11:40 AM)    Ask patient if we can refer him now because it may take them a week to get him in for vestibular therapy and gait abnormality. If he agrees, ask him where he would like to go )prefer next door if possible) and send referral. Send for dizziness and gait abnormality as diagnoses. In the comments of the referral state "for vestibular therapy and gait abnormality"    Spoke with patient. He is happy to start referral process now. He wants to see PT next door. He will call them to schedule. Order placed for PT referral with comment "for vestibular therapy and gait abnormality".

## 2022-07-21 ENCOUNTER — Encounter: Payer: Self-pay | Admitting: Neurology

## 2022-07-21 ENCOUNTER — Ambulatory Visit: Payer: PPO | Admitting: Neurology

## 2022-07-21 VITALS — BP 140/71 | HR 63 | Ht 71.0 in | Wt 190.0 lb

## 2022-07-21 DIAGNOSIS — R42 Dizziness and giddiness: Secondary | ICD-10-CM | POA: Diagnosis not present

## 2022-07-21 DIAGNOSIS — L821 Other seborrheic keratosis: Secondary | ICD-10-CM | POA: Diagnosis not present

## 2022-07-21 DIAGNOSIS — R270 Ataxia, unspecified: Secondary | ICD-10-CM | POA: Diagnosis not present

## 2022-07-21 DIAGNOSIS — Z85828 Personal history of other malignant neoplasm of skin: Secondary | ICD-10-CM | POA: Diagnosis not present

## 2022-07-21 DIAGNOSIS — L578 Other skin changes due to chronic exposure to nonionizing radiation: Secondary | ICD-10-CM | POA: Diagnosis not present

## 2022-07-21 DIAGNOSIS — D485 Neoplasm of uncertain behavior of skin: Secondary | ICD-10-CM | POA: Diagnosis not present

## 2022-07-21 DIAGNOSIS — L570 Actinic keratosis: Secondary | ICD-10-CM | POA: Diagnosis not present

## 2022-07-21 DIAGNOSIS — D044 Carcinoma in situ of skin of scalp and neck: Secondary | ICD-10-CM | POA: Diagnosis not present

## 2022-07-21 DIAGNOSIS — Z86018 Personal history of other benign neoplasm: Secondary | ICD-10-CM | POA: Diagnosis not present

## 2022-07-21 DIAGNOSIS — R2689 Other abnormalities of gait and mobility: Secondary | ICD-10-CM

## 2022-07-21 DIAGNOSIS — D225 Melanocytic nevi of trunk: Secondary | ICD-10-CM | POA: Diagnosis not present

## 2022-07-21 NOTE — Progress Notes (Signed)
[facility]    Provider:  Dr [name] Requesting Provider: [name], MD Primary Care Provider:  [name], MD  CC:  normal pressure status post shunting still with problems of imbalance and ataxia, possible migraines  Patient is here again, unfortunately I am not quite sure what else to do we have tried so many different approaches to his complaints. Since seeing him we diagnosed with obstruvtive and central sleep apnea and follow him for cpap. We sent to [place] for NPH shunting, we have referred for vestibular physical therapy. DAT scan for parkinson's disease was negative. MRi of the brain was completed in the past. We have tried nerve blocks. He has seen audiology and ENT. We have consider migraine with vertigo aura and tried to treat him. We can order MRI cervical spine to ensure imbalance/ataxia not myelopathy or other and can try some of the new migraine cgrp medications.   He feels better. He had a rough spell, 2 weeks ago he started having a dizzy episode. He hasn't been to vestibular therapy yet but is going. We ordered an MRi cervical spine but it was not completed. We discussed in details.  Medications tried: Tylenol, aspirin, B12, Decadron injections, Benadryl, coenzyme every 10, gabapentin, ibuprofen, meclizine, melatonin, zofran, nurtec, maxalt, imitrex, topamax, nortriptyline contraindicated due to age and risk of falls, blood pressure medications contraindicated due to hypotension.   Patient complains of symptoms per HPI as well as the following symptoms: imbalance . Pertinent negatives and positives per HPI. All others negative   Interval history November 30, 2021: This is a patient that is very well-known to the practice.  He is here again today with "itchiness" where the shunt is and continued imbalance.  We have seen him for multiple issues about the shunt.  He initially came to [place] with imbalance, and brain imaging possibly consistent with NPH,  his brother had NPH and dementia so he was very concerned and very eager to get a shunt, we sent him to Dr. [name] who put in a temporary lumbar drain which made him feel considerably better so the shunt was placed, since he has had the shunting he has returned multiple times with multiple symptoms including headaches.  We have extensively evaluated him and placed the shunt to try and avoid NPH in him, but at this point I explained to him I have nothing more for him as far as the shunt goes, a little itchiness that doesn't bother him is minor, if the shunt is bothering him that much he can talk to Dr. [name] to see if it can be removed and we can monitor him.  He is using his CPAP, last time I saw him in 2021 he felt improved with his vertigo and headache.  He has tingling and itching near the tube, not painful. He still feels imbalanced. He has sponginess in his feet. He still has that. He saw someone who diagnosed him and gave him infrared treatment. He says several people diagnosed him with peripheral neuropathy. Doesn't affect his life. No pain. Just a sponginess that is stable for years 5-6 years ago. He already saw Dr. [name]. He has some decreased fine motor in the right hand. Also still with imbalance. Can't tie his tie, started 5-6 ago, all stable. He practices balance.  He has neck pain, pain with head extension, extensive hx of parkinson's disease in mother, brother had NPH AND had parkinson's diesease, and sister had Parkinson's disease. No hx of dopaminerig  drugs. Mother had essential essential tremor. He feels like his handwriting is worse, shaky, he has to hold his arm, no smaller, no loss of taste or smell. He has difficulty swallowing and neck pain. Tremor is new. Walking has been slower, needs to think about it to walk faster. No significant back pain or radicular symptoms. No other focal neurologic deficits, associated symptoms, inciting events or modifiable factors.  Patient  complains of symptoms per HPI as well as the following symptoms: tremor, slowing down, handwriting problems . Pertinent negatives and positives per HPI. All others negative  Personally reviewed images prior to appointment, additional 15 minutes. 02/07/2020: ct head: IMPRESSION: 1. Sequelae of right superior frontal approach CSF shunt placement. Stable ventricle size and configuration, no adverse features. 2. No new intracranial abnormality  12/2019 ct head: IMPRESSION: 1. Right frontal approach shunt catheter terminates near the foramina of [place]. Small amount of pneumocephalus at the right frontal pole. 2. Decreased size of the lateral ventricles compared to 07/07/2019.  Interval history 01/16/2020: He had the shunting but he developed a headache. The headache is more on the right side behind the eye socket and is throbbing but just an ache, just irritating now but it did get to 6-7/10. He started using his cpap again. He feels improved with his vertigo and headache. We can try nurtec and a nerve block, will not send to infusion as he looks completely comfortable. He is having more of his vestibular episodes, we discussed trying acute management medications and if it gets worse or more frequent trying a preventative. He used to go once a year or every 6 months, now its more monthly but not as severe. So we will try nurtec. He will follow up with Dr. [name] to change the shunt settings.   Performed by Dr. [name] M.D. All procedures a documented blood were medically necessary, reasonable and appropriate based on the patient's history, medical diagnosis and physician opinion. Verbal informed consent was obtained from the patient, patient was informed of potential risk of procedure, including bruising, bleeding, hematoma formation, infection, muscle weakness, muscle pain, numbness, transient hypertension, transient hyperglycemia and transient insomnia among others. All areas injected were topically clean  with isopropyl rubbing alcohol. Nonsterile nonlatex gloves were worn during the procedure.  1. Greater occipital nerve block [phone]). The greater occipital nerve site was identified at the nuchal line medial to the occipital artery. Medication was injected into the  right occipital nerve areas and suboccipital areas. Patient's condition is associated with inflammation of the greater occipital nerve and associated multiple groups. Injection was deemed medically necessary, reasonable and appropriate. Injection represents a separate and unique surgical service.  2. Supraorbital nerve block ([id number]): Supraorbital nerve site was identified along the incision of the frontal bone on the orbital/supraorbital ridge. Medication was injected into the right supraorbital nerve areas. Patient's condition is associated with inflammation of the supraorbital and associated muscle groups. Injection was deemed medically necessary, reasonable and appropriate. Injection represents a separate and unique surgical service.   Interval history January 15, 2020: Patient has undergone lumbar drain the last several days, he was discharged from the hospital just 2 days ago, I did discuss his case with Dr. [name] who stated he had a headache in the hospital and adjusted his drain, patients here for headache.   Interval history 10/16/2019: Patient for follow up after lumbar drain placement. Patient feels as though he improved as far as his balance, he definitely improved. It was not "drastic" but it was  signigifant. I reminded him we are catching this early and so his improvement may not be as drastic as someone we diagnose in the later stages of NPH. Also discuss his episodes of dizziness may also be due to possible NPH ve vestibular migraines. We discussed this at length and he is going to see Dr [name].  Interval history 08/15/2019: Patient with episodes of dizziness unclear etiology. Possibly central associated with migraines. He  has had 2 episodes and treated with migraine IV infusion of Depacon. We discussed migraine preventative, he says meats with nitrates cause a lot of problems and triggers episodes, He doesn't have a problem with bacon but feels lunch meat affects him particularly salami. He does not have these often. 3 years ago he was having them every few months, now he has had a few more. He declines preventative, he will try to take it sooner right at the onset he will take rizatriptan. We also discussed his MRI today, reviewed images, he has some memory problems, he feels his balance is poor. His brother has NPH. He feels his memory is impaired. Reviewed MRI images with him, showed him very large ventricles.  Interval history: Patient started experiencing vestibular symptoms several days ago. 2 days ago it put him in bed. Unclear if vestibular migraines or other etiology. Discussed we should take him to the IV infusion lab and treat him like a migraine and if symptoms resolve then this is a likely indication this is central in origin and not due to canaliths for example, I suspect vestibular migraines, discussed with patient and agreed on treatment. In the furute he will call then symptoms starts for 1g depacon, today his symptoms resolved afte rtreatment which included a fogginess, dizzines, not feeling right in the head, imbalance.  HPI:  Andrew Hayes is a 81 y.o. male here as requested by [name], MD for dizziness. He was 81 years old with the first episode of vertigo, he has to lay down, a feeling of spinning and if he doesn't lay down perfectly flat keep his head on the bed he gets nauseated. Worst one was 4 years ago he was in bed for days and every time he got up he vomited. He has been evaluated by [facility] and multiple ENTs and diagnosed with vestibular migraines. He will episodes are a little dizziness, he takes rizatriptan and it does get very bad after that, but he has a "hangover" and may not feel  good for a few days but he can avoid the severe vertigo and vomiting. He is getting these "little" episodes more frequently 2x a month that last for 4-5 days. No inciting events, he has evaluated all possible triggers and none found. He even tried a migraine diet for 3-4 months recommended by [facility] and didn;t help. He never gets headaches with the episodes. He has been to vestibular therapy. He drinks enough water, he has had brain MRIs, He is having more frequent episodes, he feels his balance is impaired and sometimes he does not walk well, hearing changes. No Fhx of migraines.  Reviewed notes, labs and imaging from outside physicians, which showed:  IMPRESSION: MRI brain 2016 personally reviewed images 1. No imaging explanation for vestibular cochlear symptoms, as above. 2. Ventriculomegaly. Are there any symptoms of normal pressure hydrocephalus  Review of Systems: Patient complains of symptoms per HPI as well as the following symptoms: headache, incision pain. Pertinent negatives and positives per HPI. All others negative   Social History  Socioeconomic History   Marital status: Married    Spouse name: Hassan Rowan   Number of children: 2   Years of education: 16   Highest education level: Not on file  Occupational History   Occupation: Manufacturing engineer: RETIRED    Comment: retired  Tobacco Use   Smoking status: Former    Packs/day: 2.00    Years: 24.00    Total pack years: 48.00    Types: Cigarettes    Quit date: 10/11/1984    Years since quitting: 37.8   Smokeless tobacco: Never   Tobacco comments:    briefly used smokeless tobacco  Vaping Use   Vaping Use: Never used  Substance and Sexual Activity   Alcohol use: Not Currently    Alcohol/week: 2.0 - 3.0 standard drinks of alcohol    Types: 2 - 3 Cans of beer per week    Comment: 2-3 beers per week, currently none   Drug use: No   Sexual activity: Not on file  Other Topics Concern   Not on file  Social History Narrative    Patient lives at home with family.   Patient consumes about 3 cups of caffeine daily,   right handed.   2 children (1 deceased)   Social Determinants of Radio broadcast assistant Strain: Not on file  Food Insecurity: Not on file  Transportation Needs: Not on file  Physical Activity: Not on file  Stress: Not on file  Social Connections: Not on file  Intimate Partner Violence: Not on file    Family History  Problem Relation Age of Onset   Stroke Mother    Hypertension Mother    Parkinson's disease Mother    COPD Father    Parkinson's disease Sister    Congestive Heart Failure Sister    Dementia Brother    Hydrocephalus Brother    Parkinson's disease Brother    Heart attack Maternal Grandfather        50's   Other Paternal Grandmother        chilbirth   Heart attack Paternal Grandfather        60's   Heart disease Daughter        deceased at 6 month   Colon cancer Neg Hx    Migraines Neg Hx        none to his knowledge   Neuropathy Neg Hx     Past Medical History:  Diagnosis Date   Actinic keratoses    Adenomatous polyp of colon 2006   Arthritis    Diverticulosis    Mild   Elevated fasting glucose    Mildly   Herniated disc    Junctional nevus    Lipidemia    Mixed   Lyme disease 2020   pt placed on abx and stated he stopped taking vitamins so as not to interfere.   Mild sleep apnea    CPAP   Organic impotence    Plantar fasciitis    Pneumonia 1966   Prostate cancer [facility])    Vestibular migraine     Patient Active Problem List   Diagnosis Date Noted   NPH (normal pressure hydrocephalus) ([place]) 01/07/2020   Normal pressure hydrocephalus ([place]) 09/17/2019   Dizziness 08/15/2019   Migraine without aura and without status migrainosus, not intractable 08/15/2019   Vestibular migraine 06/25/2019   Fever and chills 05/28/2019   Impingement syndrome of left shoulder 08/10/2017   Chronic left shoulder pain 07/21/2017   Dysphagia 06/09/2015  History of  colonic polyps 06/09/2015   Dizzy spells 03/08/2012   Hyperlipidemia 03/08/2012    Past Surgical History:  Procedure Laterality Date   APPENDECTOMY     basal cell carcinoma removal from head  2023   COLONOSCOPY     ELBOW SURGERY Left 2013   KNEE ARTHROSCOPY Bilateral 2013   knee and elbow   PLACEMENT OF LUMBAR DRAIN N/A 09/17/2019   Procedure: PLACEMENT OF LUMBAR DRAIN;  Surgeon: Judith Part, MD;  Location: [place];  Service: Neurosurgery;  Laterality: N/A;  Lumbar drain placement   PROSTATECTOMY  1986   TONSILLECTOMY     VENTRICULOPERITONEAL SHUNT Right 01/07/2020   Procedure: Right ventriculoperitoneal shunt placement;  Surgeon: Judith Part, MD;  Location: [place];  Service: Neurosurgery;  Laterality: Right;    Current Outpatient Medications  Medication Sig Dispense Refill   atorvastatin (LIPITOR) 20 MG tablet Take 20 mg by mouth daily.     Cholecalciferol (VITAMIN D) 50 MCG (2000 UT) CAPS Take by mouth.     Doxylamine Succinate, Sleep, (UNISOM PO) Take by mouth at bedtime as needed.     gabapentin (NEURONTIN) 100 MG capsule Take by mouth. 2 capsules before bedtime     Multiple Vitamin (MULTIVITAMIN PO) Take by mouth.     ondansetron (ZOFRAN) 4 MG tablet Take 1 tablet (4 mg total) by mouth every 8 (eight) hours as needed for nausea or vomiting. 20 tablet 0   UNABLE TO FIND Med Name: supplement for neuropathy     No current facility-administered medications for this visit.    Allergies as of 07/21/2022 - Review Complete 07/21/2022  Allergen Reaction Noted   Percocet [oxycodone-acetaminophen] Other (See Comments) 05/23/2015    Vitals: BP (!) 140/71 (BP Location: Right Arm, Patient Position: Sitting)   Pulse 63   Ht '5\' 11"'$  (1.803 m)   Wt 190 lb (86.2 kg)   BMI 26.50 kg/m  Last Weight:  Wt Readings from Last 1 Encounters:  07/21/22 190 lb (86.2 kg)   Last Height:   Ht Readings from Last 1 Encounters:  07/21/22 '5\' 11"'$  (1.803 m)   Exam: NAD, pleasant                   Speech:    Speech is normal; fluent and spontaneous with normal comprehension.  Cognition:    The patient is oriented to person, place, and time;     recent and remote memory intact;     language fluent;    Cranial Nerves:    The pupils are equal, round, and reactive to light.Trigeminal sensation is intact and the muscles of mastication are normal. The face is symmetric. The palate elevates in the midline. Hearing intact. Voice is normal. Shoulder shrug is normal. The tongue has normal motion without fasciculations.   Coordination:  No dysmetria  Motor Observation:    No asymmetry, no atrophy, and no involuntary movements noted. Tone:    Normal muscle tone.     Strength:    Strength is V/V in the upper and lower limbs.      Sensation: impaired distally to all modalities in a gradient fashion pp, temp, vibration few secs, impaired proprioception at toes.      Gait: slightly wide based, low clearance  Reflex Exam: absent Ajs, otherwise brisk   Assessment/Plan:  Patient with continued imbalance and dizziness. He has been extensively evaluated by ENT and possibly vestibular migraines however does not fit completely with this diagnosis (never had headache  with the dizziness which is often present with at least some of the dizzy episodes).Brain  Imaging ruled out strokes, schwannomas, vascular etiologies, carotid stenosis.  Also s/p shunting for NPH, reassured patient may take time to heal.  Patient is here again, unfortunately I am not quite sure what else to do we have tried so many different approaches to his complaints. Since seeing him we diagnosed with obstruvtive and central sleep apnea and follow him for cpap. We sent to [place] for NPH shunting, we have referred for vestibular physical therapy. DAT scan for parkinson's disease was negative. MRi of the brain was completed in the past. We have tried nerve blocks. He has seen audiology and ENT. We have consideredmigraine with  vertigo aura and tried to treat him.   - We ordered MRI cervical spine to ensure imbalance/ataxia not myelopathy or other  but it was not completed. We can also check his arteries in his neck for carotid or vertebrobasilar insufficiency for his dizziness(MRA of the head was normal in the past)  PLAN FOR TODAY 07/21/2022:  Call [phone] to schedule MRI cervical spine (spoke to Tori, still approved, he just has to call) Will call for carotid dopplers - ordered Other options include starting another migraine medication(Ajovy, Emgality) Vestibular therapy (starting next week) For another extended episode of dizziness, call us for a "migraine cocktail/infusion" again to see if it helps Send for another opinion elsewhere  Medications tried that can be used in migraine management: Tylenol, aspirin, B12, Decadron injections, Benadryl, coenzyme every 10, gabapentin, ibuprofen, meclizine, melatonin, zofran, nurtec, maxalt, imitrex, topamax, nortriptyline contraindicated due to age and risk of falls, propranolol medications contraindicated due to low pulse(bradycardia).   - he had a new tremor and possibly cogwheeling in right upper extremity. He has decreased fine motor upper right hand. He has extensive FHx of parkinson's disease. He also has a postural and action tremor. extensive hx of parkinson's disease in mother, brother had NPH AND had parkinson's diesease, and sister had Parkinson's disease. No hx of dopaminerig drugs. Mother had essential essential tremor as well. DAT SCAN WAS NEGATIVE FOR PD  MRA head 06/2019 was negative.   - He has distal neuropathy in his feet. We have tested him in the past for several etiologies such as B12 and a thorough evaluation. Idiopathic.  - He also has chronic neck pain and ataxia, need to check for cervical stenosis/myelopathy(see above, ordered)  - He says his shunt is a little itchy; initially came to [place] with imbalance, and brain imaging possibly consistent  with NPH, his brother had NPH and dementia so he was very concerned and very eager to get a shunt, we sent him to Dr. [name] who put in a temporary lumbar drain which made him feel considerably better so the shunt was placed, since he has had the shunting he has returned multiple times with multiple symptoms including headaches.  We have extensively evaluated him and placed the shunt to try and avoid NPH in him, but at this point I explained to him I have nothing more for him as far as the shunt goes, a little itchiness that doesn't bother him is minor, if the shunt is bothering him that much he can talk to Dr. [name] to see if it can be removed and we can monitor him  - MRI shows large ventricles: s/p shunting. He feels strange with gait, he had a thorough workup for neuropathy which was negative including emg/ncs.  Will test a few more  labs.  - rizatriptan acutely, also he felt the IV infusion for migraines helped when he had his dizziness we can always do that again if needed. Today tried nurtec and if episodes becomemore frequent we can discuss preventative  Orders Placed This Encounter  Procedures   VAS US CAROTID      Cc: [name], MD  Sarina Ill, MD  [facility] [address] [place], [place] [id number]  I spent 70 minutes of face-to-face and non-face-to-face time with patient on the  1. Dizziness   2. Imbalance   3. Ataxia      diagnosis.  This included previsit chart review, lab review, study review, order entry, electronic health record documentation, patient education on the different diagnostic and therapeutic options, counseling and coordination of care, risks and benefits of management, compliance, or risk factor reduction. This does not include time spent on injections.

## 2022-07-21 NOTE — Patient Instructions (Addendum)
Call [phone] to schedule MRI cervical spine Will call for carotid dopplers Other options include starting another migraine medication(Ajovy, Emgality) monthly injection  Vestibular therapy (starting next week) For another extended episode of dizziness, call us for a "migraine cocktail/infusion" again to see if it helps

## 2022-07-23 ENCOUNTER — Ambulatory Visit: Payer: PPO | Attending: Neurology | Admitting: Physical Therapy

## 2022-07-23 ENCOUNTER — Encounter: Payer: Self-pay | Admitting: Physical Therapy

## 2022-07-23 ENCOUNTER — Other Ambulatory Visit: Payer: Self-pay

## 2022-07-23 DIAGNOSIS — R42 Dizziness and giddiness: Secondary | ICD-10-CM | POA: Insufficient documentation

## 2022-07-23 DIAGNOSIS — R2681 Unsteadiness on feet: Secondary | ICD-10-CM | POA: Insufficient documentation

## 2022-07-23 DIAGNOSIS — R2689 Other abnormalities of gait and mobility: Secondary | ICD-10-CM | POA: Insufficient documentation

## 2022-07-23 NOTE — Therapy (Signed)
OUTPATIENT PHYSICAL THERAPY VESTIBULAR EVALUATION     Patient Name: Andrew Hayes MRN: [id number] DOB:Feb 15, 1941, 81 y.o., male Today's Date: 07/23/2022   PT End of Session - 07/23/22 0808     Visit Number 1    Number of Visits 12    Date for PT Re-Evaluation 09/03/22    Authorization Type HTA    PT Start Time 0808   late arrival   PT Stop Time 0845    PT Time Calculation (min) 37 min    Activity Tolerance Patient tolerated treatment well    Behavior During Therapy [facility] for tasks assessed/performed             Past Medical History:  Diagnosis Date   Actinic keratoses    Adenomatous polyp of colon 2006   Arthritis    Diverticulosis    Mild   Elevated fasting glucose    Mildly   Herniated disc    Junctional nevus    Lipidemia    Mixed   Lyme disease 2020   pt placed on abx and stated he stopped taking vitamins so as not to interfere.   Mild sleep apnea    CPAP   Organic impotence    Plantar fasciitis    Pneumonia 1966   Prostate cancer [facility])    Vestibular migraine    Past Surgical History:  Procedure Laterality Date   APPENDECTOMY     basal cell carcinoma removal from head  2023   COLONOSCOPY     ELBOW SURGERY Left 2013   KNEE ARTHROSCOPY Bilateral 2013   knee and elbow   PLACEMENT OF LUMBAR DRAIN N/A 09/17/2019   Procedure: PLACEMENT OF LUMBAR DRAIN;  Surgeon: Judith Part, MD;  Location: [place];  Service: Neurosurgery;  Laterality: N/A;  Lumbar drain placement   PROSTATECTOMY  1986   TONSILLECTOMY     VENTRICULOPERITONEAL SHUNT Right 01/07/2020   Procedure: Right ventriculoperitoneal shunt placement;  Surgeon: Judith Part, MD;  Location: [place];  Service: Neurosurgery;  Laterality: Right;   Patient Active Problem List   Diagnosis Date Noted   NPH (normal pressure hydrocephalus) ([place]) 01/07/2020   Normal pressure hydrocephalus ([place]) 09/17/2019   Dizziness 08/15/2019   Migraine without aura and without status migrainosus, not  intractable 08/15/2019   Vestibular migraine 06/25/2019   Fever and chills 05/28/2019   Impingement syndrome of left shoulder 08/10/2017   Chronic left shoulder pain 07/21/2017   Dysphagia 06/09/2015   History of colonic polyps 06/09/2015   Dizzy spells 03/08/2012   Hyperlipidemia 03/08/2012    PCP: [name], MD REFERRING PROVIDER: Melvenia Beam, MD  REFERRING DIAG: R42 (ICD-10-CM) - Vertigo  THERAPY DIAG:  Dizziness and giddiness  Unsteadiness on feet  Other abnormalities of gait and mobility  ONSET DATE: MD referral, 07/14/2022  Rationale for Evaluation and Treatment Rehabilitation  SUBJECTIVE:   SUBJECTIVE STATEMENT: Have been diagnosed with neuropathy, which affects my balance.  Feels like the bottom of my feet are "spongy".  Also have vestibular migraines, which combines with the foot feeling to make my balance off.  Get the nausea and spinning, but no headache.  Had a spell with the vestibular migraine about 3x/year, last one 3 weeks ago.  This lasted 8-10 days.   Pt accompanied by: self  PERTINENT HISTORY: OSA treated with CPAP, NPD shunting, DAT scan for PD negative, hx of vertigo, vestibular migraines,   PAIN:  Are you having pain? No  PRECAUTIONS: Fall  WEIGHT BEARING  RESTRICTIONS: No  FALLS: Has patient fallen in last 6 months? No  LIVING ENVIRONMENT: Lives with: lives with their spouse Lives in: House/apartment Stairs: Yes: Internal: flight of  steps; on right going up and External: 3-4 steps; on right going up Has following equipment at home: None  PLOF: Independent and Leisure: goes to gym several times per week Enjoys golf and fly fishing  PATIENT GOALS: To have exercises to combat this balance thing.  OBJECTIVE:   DIAGNOSTIC FINDINGS: DatScan negative (12/2021), MRI for cervical spine is scheduled  COGNITION: Overall cognitive status: Within functional limits for tasks assessed   SENSATION: Light touch: WFL Describes bottom of  feet as "spongy" POSTURE:  rounded shoulders, forward head, posterior pelvic tilt, and able to correct  Cervical ROM:  WFL rotation, flexion, extension  Active A/PROM (deg) eval  Flexion   Extension   Right lateral flexion   Left lateral flexion   Right rotation   Left rotation   (Blank rows = not tested)  STRENGTH: see below  LOWER EXTREMITY MMT:   MMT Right eval Left eval  Hip flexion 5 5  Hip abduction 5 5  Hip adduction 5 5  Hip internal rotation    Hip external rotation    Knee flexion 5 5  Knee extension 5 5  Ankle dorsiflexion 4 4  Ankle plantarflexion    Ankle inversion 4 5  Ankle eversion 5 5  (Blank rows = not tested)    TRANSFERS: Assistive device utilized: None  Sit to stand: Complete Independence Stand to sit: Complete Independence  GAIT: Gait pattern: step through pattern, decreased arm swing- Right, decreased step length- Right, and wide BOS Distance walked: 50 ft x 2 Assistive device utilized: None Level of assistance: Modified independence Comments: Gait velocity:  11.22 sec = 2.92 ft/sec  FUNCTIONAL TESTS:  5 times sit to stand: 12.28 sec Functional gait assessment: 23/30 SLS:  3.69 RLE, 5.09 sec Tandem:  18.72 sec LLE posterior; 30 sec RLE posterior  M-CTSIB  Condition 1: Firm Surface, EO 30 Sec, Normal Sway  Condition 2: Firm Surface, EC 30 Sec, Mild Sway  Condition 3: Foam Surface, EO 30 Sec, Mild Sway  Condition 4: Foam Surface, EC 30 Sec, Moderate Sway     VESTIBULAR ASSESSMENT:  GENERAL OBSERVATION: Pt in no acute distress   SYMPTOM BEHAVIOR:  Subjective history: Reports hx of vestibular migraines that come on several times per year.  Reports that this happened several weeks ago and residual symptoms (weakness, spinning, nausea) last 8-10 days  this time.  No symptoms at eval today  Non-Vestibular symptoms: nausea/vomiting  Type of dizziness: Imbalance (Disequilibrium) and Unsteady with head/body turns  Frequency: Nothing  today; frequency is variable   Duration: symptoms can last days  Aggravating factors: Induced by position change: supine to sit, Induced by motion: activity in general, and reports this is vestibular-migraine related  Relieving factors: head stationary and rest  Progression of symptoms: better   *Did not perform oculomotor or positional testing today, due to time constraints with pt arriving late; also, pt reports he would like to primarily focus on balance today, as dizziness symptoms have resolved.  PATIENT EDUCATION: Education details: PT eval results, POC Person educated: Patient Education method: Explanation Education comprehension: verbalized understanding  HOME EXERCISE PROGRAM:  Not yet initiated  GOALS: Goals reviewed with patient? Yes  SHORT TERM GOALS: Target date: 08/20/2022   Pt will be independent with HEP for improved balance and gait. Baseline: Goal status: INITIAL  2.  Further vestibular testing to be assessed, goal to be written as appropriate. Baseline:  Goal status: INITIAL   LONG TERM GOALS: Target date: 09/03/2022    Pt will be independent with HEP for improved balance and gait. Baseline:  Goal status: INITIAL  2.  Pt will improve FGA score to at least 26/30 to decrease fall risk.  Baseline: 23/30 Goal status: INITIAL  3.  Pt will improve Condition 4 on MCTSIB to mild sway for improved vestibular system use for balance. Baseline: moderate sway 30 sec Goal status: INITIAL   ASSESSMENT:  CLINICAL IMPRESSION: Patient is a 81 y.o. male who was seen today for physical therapy evaluation and treatment for vertigo, imbalance, gait abnormalities.  He has history of vestibular migraines, NPH s/p shunt, testing for Parkinson's disease with DaTscan negative.  Pt had bout several weeks ago with a vestibular migraine episode, that lasted 8-10 days.  However, he presents to OPPT today with no dizziness, nausea symptoms associated with this and would like to  prioritize his balance at eval today.  With balance testing, pt does demonstrate high level balance deficits with SLS, compliant surfaces, dynamic balance on FGA.  Will plan to further assess oculomotor and positional testing for any additional vestibular needs.  He will benefit from skilled PT towards goals for improved overall functional mobility, balance, gait and decreased fall risk.  OBJECTIVE IMPAIRMENTS: Abnormal gait, decreased balance, difficulty walking, decreased strength, and dizziness.   ACTIVITY LIMITATIONS: locomotion level  PARTICIPATION LIMITATIONS: community activity  PERSONAL FACTORS: 3+ comorbidities: See above  are also affecting patient's functional outcome.   REHAB POTENTIAL: Good  CLINICAL DECISION MAKING: Stable/uncomplicated  EVALUATION COMPLEXITY: Low   PLAN: PT FREQUENCY:  2x/wk for 3 weeks, then 1x/wk for 3 weeks  PT DURATION: other: see above  PLANNED INTERVENTIONS: Therapeutic exercises, Therapeutic activity, Neuromuscular re-education, Balance training, Gait training, Patient/Family education, Self Care, Vestibular training, and Canalith repositioning  PLAN FOR NEXT SESSION: Vestibular assessment and treatment as appropriate; initiate HEP for high level balance-SLS, tandem stance/gait; compliant surface/corner balance   Mubarak Bevens W., PT 07/23/2022, 11:45 AM   [facility] at [facility] [address] [place], [place] [address] Phone # [phone] Fax # [phone]

## 2022-07-27 NOTE — Therapy (Signed)
OUTPATIENT PHYSICAL THERAPY VESTIBULAR TREATMENT     Patient Name: Andrew Hayes MRN: [id number] DOB:09-19-1941, 81 y.o., male Today's Date: 07/28/2022   PT End of Session - 07/28/22 0847     Visit Number 2    Number of Visits 12    Date for PT Re-Evaluation 09/03/22    Authorization Type HTA    PT Start Time 0759    PT Stop Time 0845    PT Time Calculation (min) 46 min    Equipment Utilized During Treatment Gait belt    Activity Tolerance Patient tolerated treatment well    Behavior During Therapy WFL for tasks assessed/performed              Past Medical History:  Diagnosis Date   Actinic keratoses    Adenomatous polyp of colon 2006   Arthritis    Diverticulosis    Mild   Elevated fasting glucose    Mildly   Herniated disc    Junctional nevus    Lipidemia    Mixed   Lyme disease 2020   pt placed on abx and stated he stopped taking vitamins so as not to interfere.   Mild sleep apnea    CPAP   Organic impotence    Plantar fasciitis    Pneumonia 1966   Prostate cancer [facility])    Vestibular migraine    Past Surgical History:  Procedure Laterality Date   APPENDECTOMY     basal cell carcinoma removal from head  2023   COLONOSCOPY     ELBOW SURGERY Left 2013   KNEE ARTHROSCOPY Bilateral 2013   knee and elbow   PLACEMENT OF LUMBAR DRAIN N/A 09/17/2019   Procedure: PLACEMENT OF LUMBAR DRAIN;  Surgeon: Judith Part, MD;  Location: [place];  Service: Neurosurgery;  Laterality: N/A;  Lumbar drain placement   PROSTATECTOMY  1986   TONSILLECTOMY     VENTRICULOPERITONEAL SHUNT Right 01/07/2020   Procedure: Right ventriculoperitoneal shunt placement;  Surgeon: Judith Part, MD;  Location: [place];  Service: Neurosurgery;  Laterality: Right;   Patient Active Problem List   Diagnosis Date Noted   NPH (normal pressure hydrocephalus) ([place]) 01/07/2020   Normal pressure hydrocephalus ([place]) 09/17/2019   Dizziness 08/15/2019   Migraine without aura and  without status migrainosus, not intractable 08/15/2019   Vestibular migraine 06/25/2019   Fever and chills 05/28/2019   Impingement syndrome of left shoulder 08/10/2017   Chronic left shoulder pain 07/21/2017   Dysphagia 06/09/2015   History of colonic polyps 06/09/2015   Dizzy spells 03/08/2012   Hyperlipidemia 03/08/2012    PCP: [name], MD REFERRING PROVIDER: Melvenia Beam, MD  REFERRING DIAG: R42 (ICD-10-CM) - Vertigo  THERAPY DIAG:  Unsteadiness on feet  Other abnormalities of gait and mobility  Dizziness and giddiness  ONSET DATE: MD referral, 07/14/2022  Rationale for Evaluation and Treatment Rehabilitation  SUBJECTIVE:   SUBJECTIVE STATEMENT: No changes since last session. Reports that about 7 years ago he went to [facility] to get tested and was diagnosed with Vestibular migraine. Only gets these episodes 3x/year and experiences dizziness and nausea, no HA. Not on meds for migraines since they occur infrequently. Does not typically have dizziness day to day. However, does report trouble with balance. Denies head trauma, infection/illness,vision changes/double vision, photo/phonophobia. Reports that ongoing trouble with hearing, nothing new. Reports some B "tinnitus" which has been ongoing. Reports that when he gets on his knees for exercises he starts shaking a lot.  Pt accompanied by: self  PERTINENT HISTORY: OSA treated with CPAP, NPD shunting, DAT scan for PD negative, hx of vertigo, vestibular migraines,   PAIN:  Are you having pain? No  PRECAUTIONS: Fall  WEIGHT BEARING RESTRICTIONS: No  FALLS: Has patient fallen in last 6 months? No  LIVING ENVIRONMENT: Lives with: lives with their spouse Lives in: House/apartment Stairs: Yes: Internal: flight of  steps; on right going up and External: 3-4 steps; on right going up Has following equipment at home: None  PLOF: Independent and Leisure: goes to gym several times per week Enjoys golf and fly  fishing  PATIENT GOALS: To have exercises to combat this balance thing.  OBJECTIVE:     TODAY'S TREATMENT: 07/28/22   VESTIBULAR ASSESSMENT   GENERAL OBSERVATION: pt is wearing bifocals which he wears all the time     OCULOMOTOR EXAM:   Ocular Alignment:  slight L esotropia   Ocular ROM:  reduced L eye adduction   Spontaneous Nystagmus: absent   Gaze-Induced Nystagmus: absent   Smooth Pursuits: intact   Saccades:  some difficulty following instruction, otherwise normal   Convergence/Divergence: 3 cm    VESTIBULAR - OCULAR REFLEX:    Slow VOR: Normal c/o some neck discomfort    VOR Cancellation: Normal   Head-Impulse Test: unable to successfully test d/t guarding; negative with self-HIT B     POSITIONAL TESTING:  Right Roll Test: negative Left Roll Test: negative Right Dix-Hallpike: negative Left Dix-Hallpike: negative *increased saccadic intrusions present throughout but no nystagmus or dizziness     Activity Comments  Romberg EC foam 30" Mod-severe sway  Standing wide on foam + head turns 30" Mod-severe sway  Standing wide on foam + head nods 30" Mod-severe sway  Fwd/back stepping with 1 foot on foam 10x each 1 UE support d/t imbalance          HOME EXERCISE PROGRAM Last updated: 07/28/22 Access Code: [email or web address] URL: [email or web address] Date: 07/28/2022 Prepared by: [facility]  Exercises - Romberg Stance Eyes Closed on Foam Pad  - 1 x daily - 5 x weekly - 2-3 sets - 30 sec hold - Narrow Stance with Eyes Closed and Head Rotation on Foam Pad  - 1 x daily - 5 x weekly - 2-3 sets - 30 sec hold - Romberg Stance with Head Nods on Foam Pad  - 1 x daily - 5 x weekly - 2-3 sets - 30 sec hold - Alternating Step Forward with Support  - 1 x daily - 5 x weekly - 2 sets - 10 reps  PATIENT EDUCATION: Education details: edu on exam findings, discussion/review of patient's fitness/balance program at home, answering  patient's questions, HEP Person educated: Patient Education method: Explanation, Demonstration, Tactile cues, Verbal cues, and Handouts Education comprehension: verbalized understanding and returned demonstration    Below measures were taken at time of initial evaluation unless otherwise specified:   DIAGNOSTIC FINDINGS: DatScan negative (12/2021), MRI for cervical spine is scheduled  COGNITION: Overall cognitive status: Within functional limits for tasks assessed   SENSATION: Light touch: WFL Describes bottom of feet as "spongy" POSTURE:  rounded shoulders, forward head, posterior pelvic tilt, and able to correct  Cervical ROM:  WFL rotation, flexion, extension  Active A/PROM (deg) eval  Flexion   Extension   Right lateral flexion   Left lateral flexion   Right rotation   Left rotation   (Blank rows = not tested)  STRENGTH: see below  LOWER EXTREMITY MMT:   MMT Right eval Left eval  Hip flexion 5 5  Hip abduction 5 5  Hip adduction 5 5  Hip internal rotation    Hip external rotation    Knee flexion 5 5  Knee extension 5 5  Ankle dorsiflexion 4 4  Ankle plantarflexion    Ankle inversion 4 5  Ankle eversion 5 5  (Blank rows = not tested)    TRANSFERS: Assistive device utilized: None  Sit to stand: Complete Independence Stand to sit: Complete Independence  GAIT: Gait pattern: step through pattern, decreased arm swing- Right, decreased step length- Right, and wide BOS Distance walked: 50 ft x 2 Assistive device utilized: None Level of assistance: Modified independence Comments: Gait velocity:  11.22 sec = 2.92 ft/sec  FUNCTIONAL TESTS:  5 times sit to stand: 12.28 sec Functional gait assessment: 23/30 SLS:  3.69 RLE, 5.09 sec Tandem:  18.72 sec LLE posterior; 30 sec RLE posterior  M-CTSIB  Condition 1: Firm Surface, EO 30 Sec, Normal Sway  Condition 2: Firm Surface, EC 30 Sec, Mild Sway  Condition 3: Foam Surface, EO 30 Sec, Mild Sway   Condition 4: Foam Surface, EC 30 Sec, Moderate Sway     VESTIBULAR ASSESSMENT:  GENERAL OBSERVATION: Pt in no acute distress   SYMPTOM BEHAVIOR:  Subjective history: Reports hx of vestibular migraines that come on several times per year.  Reports that this happened several weeks ago and residual symptoms (weakness, spinning, nausea) last 8-10 days  this time.  No symptoms at eval today  Non-Vestibular symptoms: nausea/vomiting  Type of dizziness: Imbalance (Disequilibrium) and Unsteady with head/body turns  Frequency: Nothing today; frequency is variable   Duration: symptoms can last days  Aggravating factors: Induced by position change: supine to sit, Induced by motion: activity in general, and reports this is vestibular-migraine related  Relieving factors: head stationary and rest  Progression of symptoms: better   *Did not perform oculomotor or positional testing today, due to time constraints with pt arriving late; also, pt reports he would like to primarily focus on balance today, as dizziness symptoms have resolved.  PATIENT EDUCATION: Education details: PT eval results, POC Person educated: Patient Education method: Explanation Education comprehension: verbalized understanding  HOME EXERCISE PROGRAM:  Not yet initiated  GOALS: Goals reviewed with patient? Yes  SHORT TERM GOALS: Target date: 08/20/2022   Pt will be independent with HEP for improved balance and gait. Baseline: Goal status: IN PROGRESS  2.  Further vestibular testing to be assessed, goal to be written as appropriate. Baseline:  Goal status: IN PROGRESS   LONG TERM GOALS: Target date: 09/03/2022    Pt will be independent with HEP for improved balance and gait. Baseline:  Goal status: IN PROGRESS  2.  Pt will improve FGA score to at least 26/30 to decrease fall risk.  Baseline: 23/30 Goal status: IN PROGRESS  3.  Pt will improve Condition 4 on MCTSIB to mild sway for improved vestibular system  use for balance. Baseline: moderate sway 30 sec Goal status: IN PROGRESS   ASSESSMENT:  CLINICAL IMPRESSION: Patient reports hx of vestibular migraines which come on infrequently and otherwise without c/o dizziness day to day. Does report imbalance, ongoing trouble with hearing and B tinnitus. Denies recent head trauma, infection/illness, vision changes/double vision, photo/phonophobia. Oculomotor exam revealed no particular deficits and did not bring on any dizziness. Positional testing was negative. Proceeded with getting patient set up with balance multisensory HEP. Patient reported understanding of  all edu provided and without complaints at end of session.    OBJECTIVE IMPAIRMENTS: Abnormal gait, decreased balance, difficulty walking, decreased strength, and dizziness.   ACTIVITY LIMITATIONS: locomotion level  PARTICIPATION LIMITATIONS: community activity  PERSONAL FACTORS: 3+ comorbidities: See above  are also affecting patient's functional outcome.   REHAB POTENTIAL: Good  CLINICAL DECISION MAKING: Stable/uncomplicated  EVALUATION COMPLEXITY: Low   PLAN: PT FREQUENCY:  2x/wk for 3 weeks, then 1x/wk for 3 weeks  PT DURATION: other: see above  PLANNED INTERVENTIONS: Therapeutic exercises, Therapeutic activity, Neuromuscular re-education, Balance training, Gait training, Patient/Family education, Self Care, Vestibular training, and Canalith repositioning  PLAN FOR NEXT SESSION: progress HEP for high level balance-SLS, tandem stance/gait; compliant surface/corner balance   Janene Harvey, PT, DPT 07/28/22 8:52 AM  [facility] at [facility] [address], [place], [place] [address] Phone # [phone] Fax # [phone]

## 2022-07-28 ENCOUNTER — Ambulatory Visit: Payer: PPO | Admitting: Physical Therapy

## 2022-07-28 ENCOUNTER — Encounter: Payer: Self-pay | Admitting: Physical Therapy

## 2022-07-28 DIAGNOSIS — R42 Dizziness and giddiness: Secondary | ICD-10-CM | POA: Diagnosis not present

## 2022-07-28 DIAGNOSIS — R2681 Unsteadiness on feet: Secondary | ICD-10-CM

## 2022-07-28 DIAGNOSIS — R2689 Other abnormalities of gait and mobility: Secondary | ICD-10-CM

## 2022-07-30 ENCOUNTER — Ambulatory Visit: Payer: PPO | Admitting: Physical Therapy

## 2022-07-30 ENCOUNTER — Encounter: Payer: Self-pay | Admitting: Physical Therapy

## 2022-07-30 DIAGNOSIS — R2689 Other abnormalities of gait and mobility: Secondary | ICD-10-CM

## 2022-07-30 DIAGNOSIS — R2681 Unsteadiness on feet: Secondary | ICD-10-CM

## 2022-07-30 DIAGNOSIS — R42 Dizziness and giddiness: Secondary | ICD-10-CM | POA: Diagnosis not present

## 2022-07-30 NOTE — Therapy (Signed)
OUTPATIENT PHYSICAL THERAPY VESTIBULAR TREATMENT     Patient Name: Andrew Hayes MRN: [id number] DOB:March 31, 1941, 81 y.o., male Today's Date: 07/30/2022   PT End of Session - 07/30/22 0755     Visit Number 3    Number of Visits 12    Date for PT Re-Evaluation 09/03/22    Authorization Type HTA    PT Start Time 0758    PT Stop Time 0841    PT Time Calculation (min) 43 min    Equipment Utilized During Treatment --    Activity Tolerance Patient tolerated treatment well    Behavior During Therapy [facility] for tasks assessed/performed               Past Medical History:  Diagnosis Date   Actinic keratoses    Adenomatous polyp of colon 2006   Arthritis    Diverticulosis    Mild   Elevated fasting glucose    Mildly   Herniated disc    Junctional nevus    Lipidemia    Mixed   Lyme disease 2020   pt placed on abx and stated he stopped taking vitamins so as not to interfere.   Mild sleep apnea    CPAP   Organic impotence    Plantar fasciitis    Pneumonia 1966   Prostate cancer [facility])    Vestibular migraine    Past Surgical History:  Procedure Laterality Date   APPENDECTOMY     basal cell carcinoma removal from head  2023   COLONOSCOPY     ELBOW SURGERY Left 2013   KNEE ARTHROSCOPY Bilateral 2013   knee and elbow   PLACEMENT OF LUMBAR DRAIN N/A 09/17/2019   Procedure: PLACEMENT OF LUMBAR DRAIN;  Surgeon: Judith Part, MD;  Location: [place];  Service: Neurosurgery;  Laterality: N/A;  Lumbar drain placement   PROSTATECTOMY  1986   TONSILLECTOMY     VENTRICULOPERITONEAL SHUNT Right 01/07/2020   Procedure: Right ventriculoperitoneal shunt placement;  Surgeon: Judith Part, MD;  Location: [place];  Service: Neurosurgery;  Laterality: Right;   Patient Active Problem List   Diagnosis Date Noted   NPH (normal pressure hydrocephalus) ([place]) 01/07/2020   Normal pressure hydrocephalus ([place]) 09/17/2019   Dizziness 08/15/2019   Migraine without aura and  without status migrainosus, not intractable 08/15/2019   Vestibular migraine 06/25/2019   Fever and chills 05/28/2019   Impingement syndrome of left shoulder 08/10/2017   Chronic left shoulder pain 07/21/2017   Dysphagia 06/09/2015   History of colonic polyps 06/09/2015   Dizzy spells 03/08/2012   Hyperlipidemia 03/08/2012    PCP: [name], MD REFERRING PROVIDER: Melvenia Beam, MD  REFERRING DIAG: R42 (ICD-10-CM) - Vertigo  THERAPY DIAG:  Unsteadiness on feet  Other abnormalities of gait and mobility  ONSET DATE: MD referral, 07/14/2022  Rationale for Evaluation and Treatment Rehabilitation  SUBJECTIVE:   SUBJECTIVE STATEMENT: No changes since last visit.  Feel like I lose my strength when I get on my knees.  Sometimes have some stutter/stagger steps when I first get started.  Pt accompanied by: self  PERTINENT HISTORY: OSA treated with CPAP, NPD shunting, DAT scan for PD negative, hx of vertigo, vestibular migraines,   PAIN:  Are you having pain? No  PRECAUTIONS: Fall  WEIGHT BEARING RESTRICTIONS: No  FALLS: Has patient fallen in last 6 months? No  LIVING ENVIRONMENT: Lives with: lives with their spouse Lives in: House/apartment Stairs: Yes: Internal: flight of  steps; on right  going up and External: 3-4 steps; on right going up Has following equipment at home: None  PLOF: Independent and Leisure: goes to gym several times per week Enjoys golf and fly fishing  PATIENT GOALS: To have exercises to combat this balance thing.  OBJECTIVE:    TODAY'S TREATMENT: 07/30/2022 Activity Comments  Reviewed full HEP-see below Pt return demo understanding  Standing on Airex:  side step and weightshift, return to midline, x 10 reps UE support, cues for foot clearance  Standing on Airex:  back step and weightshift x 10 reps; then forward step and weightshift x 10 reps   On Airex:  heel/toe raises 2 x 10 reps; alt heel raises x 10 reps UE support  Partial  tandem stance on Airex: EO head turns/nods x 30 sec each EC 30 sec UE support, mild sway, with increased unsteadiness with EC R foot posterior position  Resisted sidestep, resisted forward/back monster walk with green theraband along counter, 3-5 reps Pt with significant difficulty with RLE extension backwards with monster walk  Forward step ups (single leg) x 10 reps each leg leading, to 6" step Difficulty with coordination of RLE leading with step up activity  Tall kneeling work facing mat table, pt with tremor-type movement pattern throughout lower trunk and extremities when in tall kneel with trunk slightly flexed.  Cues to pull belly button forward for glut activation, where more stability noted.  Tall kneel>sit back on heels x 5 reps Heavy cueing for patient to achieve tall kneel with glut activation   ------------------------------------------------------------------------------  TREATMENT: 07/28/22   VESTIBULAR ASSESSMENT   GENERAL OBSERVATION: pt is wearing bifocals which he wears all the time     OCULOMOTOR EXAM:   Ocular Alignment:  slight L esotropia   Ocular ROM:  reduced L eye adduction   Spontaneous Nystagmus: absent   Gaze-Induced Nystagmus: absent   Smooth Pursuits: intact   Saccades:  some difficulty following instruction, otherwise normal   Convergence/Divergence: 3 cm    VESTIBULAR - OCULAR REFLEX:    Slow VOR: Normal c/o some neck discomfort    VOR Cancellation: Normal   Head-Impulse Test: unable to successfully test d/t guarding; negative with self-HIT B     POSITIONAL TESTING:  Right Roll Test: negative Left Roll Test: negative Right Dix-Hallpike: negative Left Dix-Hallpike: negative *increased saccadic intrusions present throughout but no nystagmus or dizziness     Activity Comments  Romberg EC foam 30" Mod-severe sway  Standing wide on foam + head turns 30" Mod-severe sway  Standing wide on foam + head nods 30" Mod-severe sway  Fwd/back stepping  with 1 foot on foam 10x each 1 UE support d/t imbalance          HOME EXERCISE PROGRAM Last updated: 07/28/22 Access Code: [email or web address] URL: [email or web address] Date: 07/28/2022 Prepared by: [facility]  Exercises - Romberg Stance Eyes Closed on Foam Pad  - 1 x daily - 5 x weekly - 2-3 sets - 30 sec hold - Narrow Stance with Eyes Closed and Head Rotation on Foam Pad  - 1 x daily - 5 x weekly - 2-3 sets - 30 sec hold - Romberg Stance with Head Nods on Foam Pad  - 1 x daily - 5 x weekly - 2-3 sets - 30 sec hold - Alternating Step Forward with Support  - 1 x daily - 5 x weekly - 2 sets - 10 reps  PATIENT EDUCATION: Education details: edu on exam findings,  discussion/review of patient's fitness/balance program at home, answering patient's questions, HEP Person educated: Patient Education method: Explanation, Demonstration, Tactile cues, Verbal cues, and Handouts Education comprehension: verbalized understanding and returned demonstration   -------------------------------------------------------------------- Below measures were taken at time of initial evaluation unless otherwise specified:   DIAGNOSTIC FINDINGS: DatScan negative (12/2021), MRI for cervical spine is scheduled  COGNITION: Overall cognitive status: Within functional limits for tasks assessed   SENSATION: Light touch: WFL Describes bottom of feet as "spongy" POSTURE:  rounded shoulders, forward head, posterior pelvic tilt, and able to correct  Cervical ROM:  WFL rotation, flexion, extension  Active A/PROM (deg) eval  Flexion   Extension   Right lateral flexion   Left lateral flexion   Right rotation   Left rotation   (Blank rows = not tested)  STRENGTH: see below  LOWER EXTREMITY MMT:   MMT Right eval Left eval  Hip flexion 5 5  Hip abduction 5 5  Hip adduction 5 5  Hip internal rotation    Hip external rotation    Knee flexion 5 5  Knee  extension 5 5  Ankle dorsiflexion 4 4  Ankle plantarflexion    Ankle inversion 4 5  Ankle eversion 5 5  (Blank rows = not tested)    TRANSFERS: Assistive device utilized: None  Sit to stand: Complete Independence Stand to sit: Complete Independence  GAIT: Gait pattern: step through pattern, decreased arm swing- Right, decreased step length- Right, and wide BOS Distance walked: 50 ft x 2 Assistive device utilized: None Level of assistance: Modified independence Comments: Gait velocity:  11.22 sec = 2.92 ft/sec  FUNCTIONAL TESTS:  5 times sit to stand: 12.28 sec Functional gait assessment: 23/30 SLS:  3.69 RLE, 5.09 sec Tandem:  18.72 sec LLE posterior; 30 sec RLE posterior  M-CTSIB  Condition 1: Firm Surface, EO 30 Sec, Normal Sway  Condition 2: Firm Surface, EC 30 Sec, Mild Sway  Condition 3: Foam Surface, EO 30 Sec, Mild Sway  Condition 4: Foam Surface, EC 30 Sec, Moderate Sway     VESTIBULAR ASSESSMENT:  GENERAL OBSERVATION: Pt in no acute distress   SYMPTOM BEHAVIOR:  Subjective history: Reports hx of vestibular migraines that come on several times per year.  Reports that this happened several weeks ago and residual symptoms (weakness, spinning, nausea) last 8-10 days  this time.  No symptoms at eval today  Non-Vestibular symptoms: nausea/vomiting  Type of dizziness: Imbalance (Disequilibrium) and Unsteady with head/body turns  Frequency: Nothing today; frequency is variable   Duration: symptoms can last days  Aggravating factors: Induced by position change: supine to sit, Induced by motion: activity in general, and reports this is vestibular-migraine related  Relieving factors: head stationary and rest  Progression of symptoms: better   *Did not perform oculomotor or positional testing today, due to time constraints with pt arriving late; also, pt reports he would like to primarily focus on balance today, as dizziness symptoms have resolved.  PATIENT  EDUCATION: Education details: PT eval results, POC Person educated: Patient Education method: Explanation Education comprehension: verbalized understanding  HOME EXERCISE PROGRAM:  Not yet initiated  GOALS: Goals reviewed with patient? Yes  SHORT TERM GOALS: Target date: 08/20/2022   Pt will be independent with HEP for improved balance and gait. Baseline: Goal status: IN PROGRESS  2.  Further vestibular testing to be assessed, goal to be written as appropriate. Baseline:  Goal status: IN PROGRESS   LONG TERM GOALS: Target date: 09/03/2022  Pt will be independent with HEP for improved balance and gait. Baseline:  Goal status: IN PROGRESS  2.  Pt will improve FGA score to at least 26/30 to decrease fall risk.  Baseline: 23/30 Goal status: IN PROGRESS  3.  Pt will improve Condition 4 on MCTSIB to mild sway for improved vestibular system use for balance. Baseline: moderate sway 30 sec Goal status: IN PROGRESS   ASSESSMENT:  CLINICAL IMPRESSION: Skilled PT session today focused on compliant surface balance work and functional strengthening for quads and gluts.  Pt has difficulty with RLE being the weightbearing leg with partial tandem stance, with step ups and difficulty noted with RLE hip extension for monster walk activity.  In tall kneel, pt describes a feeling of weakness through hips/knees and a tremor-pattern noted with pt in this position.  He is able to be more stable with cues for glut activation, but this takes heavy cueing and pt does not hold this position long.  He will continue to benefit from skilled PT towards goals for improved overall balance and functional mobility as well as decreased fall risk.   OBJECTIVE IMPAIRMENTS: Abnormal gait, decreased balance, difficulty walking, decreased strength, and dizziness.   ACTIVITY LIMITATIONS: locomotion level  PARTICIPATION LIMITATIONS: community activity  PERSONAL FACTORS: 3+ comorbidities: See above  are also  affecting patient's functional outcome.   REHAB POTENTIAL: Good  CLINICAL DECISION MAKING: Stable/uncomplicated  EVALUATION COMPLEXITY: Low   PLAN: PT FREQUENCY:  2x/wk for 3 weeks, then 1x/wk for 3 weeks  PT DURATION: other: see above  PLANNED INTERVENTIONS: Therapeutic exercises, Therapeutic activity, Neuromuscular re-education, Balance training, Gait training, Patient/Family education, Self Care, Vestibular training, and Canalith repositioning  PLAN FOR NEXT SESSION: Glut strengthening, anterior/posterior weightshifitng (?rockerboard/wall bumps), continue to progress HEP for high level balance-SLS, tandem stance/gait; compliant surface/corner balance   Mady Haagensen, PT 07/30/22 8:54 AM Phone: [phone] Fax: [place] [facility] [address] [place], [place] [address] Phone # [phone] Fax # [phone]

## 2022-08-02 ENCOUNTER — Ambulatory Visit: Payer: PPO | Admitting: Neurology

## 2022-08-03 NOTE — Therapy (Signed)
OUTPATIENT PHYSICAL THERAPY VESTIBULAR TREATMENT     Patient Name: Elmus Mathes MRN: [id number] DOB:Jan 07, 1941, 81 y.o., male Today's Date: 08/04/2022   PT End of Session - 08/04/22 1531     Visit Number 4    Number of Visits 12    Date for PT Re-Evaluation 09/03/22    Authorization Type HTA    PT Start Time 1453    PT Stop Time 1531    PT Time Calculation (min) 38 min    Activity Tolerance Patient tolerated treatment well    Behavior During Therapy WFL for tasks assessed/performed                Past Medical History:  Diagnosis Date   Actinic keratoses    Adenomatous polyp of colon 2006   Arthritis    Diverticulosis    Mild   Elevated fasting glucose    Mildly   Herniated disc    Junctional nevus    Lipidemia    Mixed   Lyme disease 2020   pt placed on abx and stated he stopped taking vitamins so as not to interfere.   Mild sleep apnea    CPAP   Organic impotence    Plantar fasciitis    Pneumonia 1966   Prostate cancer [facility])    Vestibular migraine    Past Surgical History:  Procedure Laterality Date   APPENDECTOMY     basal cell carcinoma removal from head  2023   COLONOSCOPY     ELBOW SURGERY Left 2013   KNEE ARTHROSCOPY Bilateral 2013   knee and elbow   PLACEMENT OF LUMBAR DRAIN N/A 09/17/2019   Procedure: PLACEMENT OF LUMBAR DRAIN;  Surgeon: Judith Part, MD;  Location: [place];  Service: Neurosurgery;  Laterality: N/A;  Lumbar drain placement   PROSTATECTOMY  1986   TONSILLECTOMY     VENTRICULOPERITONEAL SHUNT Right 01/07/2020   Procedure: Right ventriculoperitoneal shunt placement;  Surgeon: Judith Part, MD;  Location: [place];  Service: Neurosurgery;  Laterality: Right;   Patient Active Problem List   Diagnosis Date Noted   NPH (normal pressure hydrocephalus) ([place]) 01/07/2020   Normal pressure hydrocephalus ([place]) 09/17/2019   Dizziness 08/15/2019   Migraine without aura and without status migrainosus, not intractable  08/15/2019   Vestibular migraine 06/25/2019   Fever and chills 05/28/2019   Impingement syndrome of left shoulder 08/10/2017   Chronic left shoulder pain 07/21/2017   Dysphagia 06/09/2015   History of colonic polyps 06/09/2015   Dizzy spells 03/08/2012   Hyperlipidemia 03/08/2012    PCP: [name], MD REFERRING PROVIDER: Melvenia Beam, MD  REFERRING DIAG: R42 (ICD-10-CM) - Vertigo  THERAPY DIAG:  Unsteadiness on feet  Other abnormalities of gait and mobility  Dizziness and giddiness  ONSET DATE: MD referral, 07/14/2022  Rationale for Evaluation and Treatment Rehabilitation  SUBJECTIVE:   SUBJECTIVE STATEMENT: Went fishing over the weekend which is when he has the most problems d/t rocks and moving water.   Pt accompanied by: self  PERTINENT HISTORY: OSA treated with CPAP, NPD shunting, DAT scan for PD negative, hx of vertigo, vestibular migraines,   PAIN:  Are you having pain? No  PRECAUTIONS: Fall  WEIGHT BEARING RESTRICTIONS: No  FALLS: Has patient fallen in last 6 months? No  LIVING ENVIRONMENT: Lives with: lives with their spouse Lives in: House/apartment Stairs: Yes: Internal: flight of  steps; on right going up and External: 3-4 steps; on right going up Has following equipment  at home: None  PLOF: Independent and Leisure: goes to gym several times per week Enjoys golf and fly fishing  PATIENT GOALS: To have exercises to combat this balance thing.  OBJECTIVE:     TODAY'S TREATMENT: 08/04/22 Activity Comments  Bridge x10 Limited ROM to decrease LB pressure   Bridge with red TB 2x10 Limited ROM to decrease LB pressure   Rockerboard: A/P and M/L directions Static standing 2x30" Rocking  Occasional UE support'  wall bumps EO/EC/foam Shoulder and hip pattern Difficulty recruiting glutes with shoulder strategy  standing hip ABD with red TB 2x10 standing hip EX with red TB 2x10  At counter top; cues to avoid shoulder lean  fwd/back  stepping on foam 10x each No UE support     PATIENT EDUCATION: Education details: HEP update Person educated: Patient Education method: Explanation, Demonstration, Tactile cues, Verbal cues, and Handouts Education comprehension: verbalized understanding and returned demonstration   HOME EXERCISE PROGRAM Last updated: 08/04/22 Access Code: [email or web address] URL: [email or web address] Date: 08/04/2022 Prepared by: [facility]  Exercises - Romberg Stance Eyes Closed on Foam Pad  - 1 x daily - 5 x weekly - 2-3 sets - 30 sec hold - Narrow Stance with Eyes Closed and Head Rotation on Foam Pad  - 1 x daily - 5 x weekly - 2-3 sets - 30 sec hold - Romberg Stance with Head Nods on Foam Pad  - 1 x daily - 5 x weekly - 2-3 sets - 30 sec hold - Alternating Step Forward with Support  - 1 x daily - 5 x weekly - 2 sets - 10 reps - Standing Anterior Posterior Weight Shift  - 1 x daily - 5 x weekly - 2 sets - 10 reps   ------------------------------------------------------------------------------  TREATMENT: 07/28/22   VESTIBULAR ASSESSMENT   GENERAL OBSERVATION: pt is wearing bifocals which he wears all the time     OCULOMOTOR EXAM:   Ocular Alignment:  slight L esotropia   Ocular ROM:  reduced L eye adduction   Spontaneous Nystagmus: absent   Gaze-Induced Nystagmus: absent   Smooth Pursuits: intact   Saccades:  some difficulty following instruction, otherwise normal   Convergence/Divergence: 3 cm    VESTIBULAR - OCULAR REFLEX:    Slow VOR: Normal c/o some neck discomfort    VOR Cancellation: Normal   Head-Impulse Test: unable to successfully test d/t guarding; negative with self-HIT B     POSITIONAL TESTING:  Right Roll Test: negative Left Roll Test: negative Right Dix-Hallpike: negative Left Dix-Hallpike: negative *increased saccadic intrusions present throughout but no nystagmus or dizziness     Activity Comments  Romberg EC  foam 30" Mod-severe sway  Standing wide on foam + head turns 30" Mod-severe sway  Standing wide on foam + head nods 30" Mod-severe sway  Fwd/back stepping with 1 foot on foam 10x each 1 UE support d/t imbalance          HOME EXERCISE PROGRAM Last updated: 07/28/22 Access Code: [email or web address] URL: [email or web address] Date: 07/28/2022 Prepared by: [facility]  Exercises - Romberg Stance Eyes Closed on Foam Pad  - 1 x daily - 5 x weekly - 2-3 sets - 30 sec hold - Narrow Stance with Eyes Closed and Head Rotation on Foam Pad  - 1 x daily - 5 x weekly - 2-3 sets - 30 sec hold - Romberg Stance with Head Nods on Foam Pad  - 1  x daily - 5 x weekly - 2-3 sets - 30 sec hold - Alternating Step Forward with Support  - 1 x daily - 5 x weekly - 2 sets - 10 reps  PATIENT EDUCATION: Education details: edu on exam findings, discussion/review of patient's fitness/balance program at home, answering patient's questions, HEP Person educated: Patient Education method: Explanation, Demonstration, Tactile cues, Verbal cues, and Handouts Education comprehension: verbalized understanding and returned demonstration   -------------------------------------------------------------------- Below measures were taken at time of initial evaluation unless otherwise specified:   DIAGNOSTIC FINDINGS: DatScan negative (12/2021), MRI for cervical spine is scheduled  COGNITION: Overall cognitive status: Within functional limits for tasks assessed   SENSATION: Light touch: WFL Describes bottom of feet as "spongy" POSTURE:  rounded shoulders, forward head, posterior pelvic tilt, and able to correct  Cervical ROM:  WFL rotation, flexion, extension  Active A/PROM (deg) eval  Flexion   Extension   Right lateral flexion   Left lateral flexion   Right rotation   Left rotation   (Blank rows = not tested)  STRENGTH: see below  LOWER EXTREMITY MMT:   MMT  Right eval Left eval  Hip flexion 5 5  Hip abduction 5 5  Hip adduction 5 5  Hip internal rotation    Hip external rotation    Knee flexion 5 5  Knee extension 5 5  Ankle dorsiflexion 4 4  Ankle plantarflexion    Ankle inversion 4 5  Ankle eversion 5 5  (Blank rows = not tested)    TRANSFERS: Assistive device utilized: None  Sit to stand: Complete Independence Stand to sit: Complete Independence  GAIT: Gait pattern: step through pattern, decreased arm swing- Right, decreased step length- Right, and wide BOS Distance walked: 50 ft x 2 Assistive device utilized: None Level of assistance: Modified independence Comments: Gait velocity:  11.22 sec = 2.92 ft/sec  FUNCTIONAL TESTS:  5 times sit to stand: 12.28 sec Functional gait assessment: 23/30 SLS:  3.69 RLE, 5.09 sec Tandem:  18.72 sec LLE posterior; 30 sec RLE posterior  M-CTSIB  Condition 1: Firm Surface, EO 30 Sec, Normal Sway  Condition 2: Firm Surface, EC 30 Sec, Mild Sway  Condition 3: Foam Surface, EO 30 Sec, Mild Sway  Condition 4: Foam Surface, EC 30 Sec, Moderate Sway     VESTIBULAR ASSESSMENT:  GENERAL OBSERVATION: Pt in no acute distress   SYMPTOM BEHAVIOR:  Subjective history: Reports hx of vestibular migraines that come on several times per year.  Reports that this happened several weeks ago and residual symptoms (weakness, spinning, nausea) last 8-10 days  this time.  No symptoms at eval today  Non-Vestibular symptoms: nausea/vomiting  Type of dizziness: Imbalance (Disequilibrium) and Unsteady with head/body turns  Frequency: Nothing today; frequency is variable   Duration: symptoms can last days  Aggravating factors: Induced by position change: supine to sit, Induced by motion: activity in general, and reports this is vestibular-migraine related  Relieving factors: head stationary and rest  Progression of symptoms: better   *Did not perform oculomotor or positional testing today, due to time  constraints with pt arriving late; also, pt reports he would like to primarily focus on balance today, as dizziness symptoms have resolved.  PATIENT EDUCATION: Education details: PT eval results, POC Person educated: Patient Education method: Explanation Education comprehension: verbalized understanding  HOME EXERCISE PROGRAM:  Not yet initiated  GOALS: Goals reviewed with patient? Yes  SHORT TERM GOALS: Target date: 08/20/2022   Pt will be independent with  HEP for improved balance and gait. Baseline: Goal status: IN PROGRESS  2.  Further vestibular testing to be assessed, goal to be written as appropriate. Baseline:  Goal status: IN PROGRESS   LONG TERM GOALS: Target date: 09/03/2022    Pt will be independent with HEP for improved balance and gait. Baseline:  Goal status: IN PROGRESS  2.  Pt will improve FGA score to at least 26/30 to decrease fall risk.  Baseline: 23/30 Goal status: IN PROGRESS  3.  Pt will improve Condition 4 on MCTSIB to mild sway for improved vestibular system use for balance. Baseline: moderate sway 30 sec Goal status: IN PROGRESS   ASSESSMENT:  CLINICAL IMPRESSION: Patient arrived to session with report of some imbalance when fishing over the weekend. Worked on glute strengthening activities with patient demonstrating some pressure in the LB- otherwise tolerated well. Worked on static and dynamic balance on rocker board which was challenging in B planes. Patient with difficulty recruiting glutes with shoulder strategy wall bump . Updated HEP with this exercise for further practice. Patient reported understanding and without complaints at end of session.   OBJECTIVE IMPAIRMENTS: Abnormal gait, decreased balance, difficulty walking, decreased strength, and dizziness.   ACTIVITY LIMITATIONS: locomotion level  PARTICIPATION LIMITATIONS: community activity  PERSONAL FACTORS: 3+ comorbidities: See above  are also affecting patient's functional  outcome.   REHAB POTENTIAL: Good  CLINICAL DECISION MAKING: Stable/uncomplicated  EVALUATION COMPLEXITY: Low   PLAN: PT FREQUENCY:  2x/wk for 3 weeks, then 1x/wk for 3 weeks  PT DURATION: other: see above  PLANNED INTERVENTIONS: Therapeutic exercises, Therapeutic activity, Neuromuscular re-education, Balance training, Gait training, Patient/Family education, Self Care, Vestibular training, and Canal[place] repositioning  PLAN FOR NEXT SESSION: Glut strengthening, continue anterior/posterior weightshifitng (?rockerboard/wall bumps), continue to progress HEP for high level balance-SLS, tandem stance/gait; compliant surface/corner balance   Janene Harvey, PT, DPT 08/04/22 3:50 PM  [facility] [address] [place], [place] [address] Phone # [phone] Fax # [phone]

## 2022-08-04 ENCOUNTER — Encounter: Payer: Self-pay | Admitting: Physical Therapy

## 2022-08-04 ENCOUNTER — Ambulatory Visit: Payer: PPO | Admitting: Physical Therapy

## 2022-08-04 DIAGNOSIS — R42 Dizziness and giddiness: Secondary | ICD-10-CM | POA: Diagnosis not present

## 2022-08-04 DIAGNOSIS — R2681 Unsteadiness on feet: Secondary | ICD-10-CM

## 2022-08-04 DIAGNOSIS — R2689 Other abnormalities of gait and mobility: Secondary | ICD-10-CM

## 2022-08-06 ENCOUNTER — Ambulatory Visit: Payer: PPO | Admitting: Physical Therapy

## 2022-08-11 ENCOUNTER — Ambulatory Visit: Payer: PPO | Attending: Neurology | Admitting: Physical Therapy

## 2022-08-11 ENCOUNTER — Ambulatory Visit (HOSPITAL_COMMUNITY)
Admission: RE | Admit: 2022-08-11 | Discharge: 2022-08-11 | Disposition: A | Discharge status: Home / Self Care | Payer: PPO | Source: Ambulatory Visit | Attending: Neurology | Admitting: Neurology

## 2022-08-11 ENCOUNTER — Encounter: Payer: Self-pay | Admitting: Physical Therapy

## 2022-08-11 DIAGNOSIS — R42 Dizziness and giddiness: Secondary | ICD-10-CM | POA: Insufficient documentation

## 2022-08-11 DIAGNOSIS — R270 Ataxia, unspecified: Secondary | ICD-10-CM | POA: Diagnosis not present

## 2022-08-11 DIAGNOSIS — R2681 Unsteadiness on feet: Secondary | ICD-10-CM | POA: Diagnosis not present

## 2022-08-11 DIAGNOSIS — R2689 Other abnormalities of gait and mobility: Secondary | ICD-10-CM | POA: Insufficient documentation

## 2022-08-11 NOTE — Therapy (Signed)
OUTPATIENT PHYSICAL THERAPY VESTIBULAR TREATMENT     Patient Name: Andrew Hayes MRN: [id number] DOB:03-20-1941, 81 y.o., male Today's Date: 08/11/2022   PT End of Session - 08/11/22 0846     Visit Number 5    Number of Visits 12    Date for PT Re-Evaluation 09/03/22    Authorization Type HTA    PT Start Time 0847    PT Stop Time 0927    PT Time Calculation (min) 40 min    Activity Tolerance Patient tolerated treatment well    Behavior During Therapy [facility] for tasks assessed/performed                Past Medical History:  Diagnosis Date   Actinic keratoses    Adenomatous polyp of colon 2006   Arthritis    Diverticulosis    Mild   Elevated fasting glucose    Mildly   Herniated disc    Junctional nevus    Lipidemia    Mixed   Lyme disease 2020   pt placed on abx and stated he stopped taking vitamins so as not to interfere.   Mild sleep apnea    CPAP   Organic impotence    Plantar fasciitis    Pneumonia 1966   Prostate cancer [facility])    Vestibular migraine    Past Surgical History:  Procedure Laterality Date   APPENDECTOMY     basal cell carcinoma removal from head  2023   COLONOSCOPY     ELBOW SURGERY Left 2013   KNEE ARTHROSCOPY Bilateral 2013   knee and elbow   PLACEMENT OF LUMBAR DRAIN N/A 09/17/2019   Procedure: PLACEMENT OF LUMBAR DRAIN;  Surgeon: Judith Part, MD;  Location: [place];  Service: Neurosurgery;  Laterality: N/A;  Lumbar drain placement   PROSTATECTOMY  1986   TONSILLECTOMY     VENTRICULOPERITONEAL SHUNT Right 01/07/2020   Procedure: Right ventriculoperitoneal shunt placement;  Surgeon: Judith Part, MD;  Location: [place];  Service: Neurosurgery;  Laterality: Right;   Patient Active Problem List   Diagnosis Date Noted   NPH (normal pressure hydrocephalus) ([place]) 01/07/2020   Normal pressure hydrocephalus ([place]) 09/17/2019   Dizziness 08/15/2019   Migraine without aura and without status migrainosus, not intractable  08/15/2019   Vestibular migraine 06/25/2019   Fever and chills 05/28/2019   Impingement syndrome of left shoulder 08/10/2017   Chronic left shoulder pain 07/21/2017   Dysphagia 06/09/2015   History of colonic polyps 06/09/2015   Dizzy spells 03/08/2012   Hyperlipidemia 03/08/2012    PCP: [name], MD REFERRING PROVIDER: Melvenia Beam, MD  REFERRING DIAG: R42 (ICD-10-CM) - Vertigo  THERAPY DIAG:  Unsteadiness on feet  Other abnormalities of gait and mobility  Dizziness and giddiness  ONSET DATE: MD referral, 07/14/2022  Rationale for Evaluation and Treatment Rehabilitation  SUBJECTIVE:   SUBJECTIVE STATEMENT: Doing more of the exercises and being more aware of what I'm doing.  Feel the spongy-ness on the R foot.  No falls.  Pt accompanied by: self  PERTINENT HISTORY: OSA treated with CPAP, NPD shunting, DAT scan for PD negative, hx of vertigo, vestibular migraines,   PAIN:  Are you having pain? No  PRECAUTIONS: Fall  WEIGHT BEARING RESTRICTIONS: No  FALLS: Has patient fallen in last 6 months? No  LIVING ENVIRONMENT: Lives with: lives with their spouse Lives in: House/apartment Stairs: Yes: Internal: flight of  steps; on right going up and External: 3-4 steps; on right  going up Has following equipment at home: None  PLOF: Independent and Leisure: goes to gym several times per week Enjoys golf and fly fishing  PATIENT GOALS: To have exercises to combat this balance thing.  OBJECTIVE:    TODAY'S TREATMENT: 08/11/2022 Activity Comments  Heel/toe raises 2 x 10 reps at counter   Stagger stance rock forward/back Cues to hinge at hips  Reviewed hip strategy at wall:  EO and EC  Cues for optimal technique  Resisted sidestepping at counter, red theraband, 3 reps   standing hip ABD with red TB 2x10 standing hip EX with red TB 2x10 Cues for posture, technique  Resisted monster walk, forward/back, 3 reps alongside counter Min guard assist  SLS:  alt  step taps to 4" cabinet shelf x 10, then to cones x 10 on solid ground, then standing on Airex 2 x 10 Intermittent UE support  Step up/lift on Airex x 5 reps each leg    Access Code: [email or web address] URL: [email or web address] Date: 08/11/2022-most recent update Prepared by: [facility]  Exercises - Romberg Stance Eyes Closed on Foam Pad  - 1 x daily - 5 x weekly - 2-3 sets - 30 sec hold - Narrow Stance with Eyes Closed and Head Rotation on Foam Pad  - 1 x daily - 5 x weekly - 2-3 sets - 30 sec hold - Romberg Stance with Head Nods on Foam Pad  - 1 x daily - 5 x weekly - 2-3 sets - 30 sec hold - Alternating Step Forward with Support  - 1 x daily - 5 x weekly - 2 sets - 10 reps - Standing Anterior Posterior Weight Shift  - 1 x daily - 5 x weekly - 2 sets - 10 reps - Standing Hip Abduction with Resistance at Ankles and Counter Support  - 1 x daily - 7 x weekly - 3 sets - 10 reps - Standing Hip Extension with Resistance at Ankles and Counter Support  - 1 x daily - 7 x weekly - 3 sets - 10 reps      PATIENT EDUCATION: Education details: HEP updates Person educated: Patient Education method: Explanation, Demonstration, Tactile cues, Verbal cues, and Handouts Education comprehension: verbalized understanding and returned demonstration    ------------------------------------------------------------------------------  TREATMENT: 07/28/22   VESTIBULAR ASSESSMENT   GENERAL OBSERVATION: pt is wearing bifocals which he wears all the time     OCULOMOTOR EXAM:   Ocular Alignment:  slight L esotropia   Ocular ROM:  reduced L eye adduction   Spontaneous Nystagmus: absent   Gaze-Induced Nystagmus: absent   Smooth Pursuits: intact   Saccades:  some difficulty following instruction, otherwise normal   Convergence/Divergence: 3 cm    VESTIBULAR - OCULAR REFLEX:    Slow VOR: Normal c/o some neck discomfort    VOR Cancellation:  Normal   Head-Impulse Test: unable to successfully test d/t guarding; negative with self-HIT B     POSITIONAL TESTING:  Right Roll Test: negative Left Roll Test: negative Right Dix-Hallpike: negative Left Dix-Hallpike: negative *increased saccadic intrusions present throughout but no nystagmus or dizziness     Activity Comments  Romberg EC foam 30" Mod-severe sway  Standing wide on foam + head turns 30" Mod-severe sway  Standing wide on foam + head nods 30" Mod-severe sway  Fwd/back stepping with 1 foot on foam 10x each 1 UE support d/t imbalance          HOME EXERCISE PROGRAM Last  updated: 07/28/22 Access Code: [email or web address] URL: [email or web address] Date: 07/28/2022 Prepared by: [facility]  Exercises - Romberg Stance Eyes Closed on Foam Pad  - 1 x daily - 5 x weekly - 2-3 sets - 30 sec hold - Narrow Stance with Eyes Closed and Head Rotation on Foam Pad  - 1 x daily - 5 x weekly - 2-3 sets - 30 sec hold - Romberg Stance with Head Nods on Foam Pad  - 1 x daily - 5 x weekly - 2-3 sets - 30 sec hold - Alternating Step Forward with Support  - 1 x daily - 5 x weekly - 2 sets - 10 reps  PATIENT EDUCATION: Education details: edu on exam findings, discussion/review of patient's fitness/balance program at home, answering patient's questions, HEP Person educated: Patient Education method: Explanation, Demonstration, Tactile cues, Verbal cues, and Handouts Education comprehension: verbalized understanding and returned demonstration   -------------------------------------------------------------------- Below measures were taken at time of initial evaluation unless otherwise specified:   DIAGNOSTIC FINDINGS: DatScan negative (12/2021), MRI for cervical spine is scheduled  COGNITION: Overall cognitive status: Within functional limits for tasks assessed   SENSATION: Light touch: WFL Describes bottom of feet as "spongy" POSTURE:   rounded shoulders, forward head, posterior pelvic tilt, and able to correct  Cervical ROM:  WFL rotation, flexion, extension  Active A/PROM (deg) eval  Flexion   Extension   Right lateral flexion   Left lateral flexion   Right rotation   Left rotation   (Blank rows = not tested)  STRENGTH: see below  LOWER EXTREMITY MMT:   MMT Right eval Left eval  Hip flexion 5 5  Hip abduction 5 5  Hip adduction 5 5  Hip internal rotation    Hip external rotation    Knee flexion 5 5  Knee extension 5 5  Ankle dorsiflexion 4 4  Ankle plantarflexion    Ankle inversion 4 5  Ankle eversion 5 5  (Blank rows = not tested)    TRANSFERS: Assistive device utilized: None  Sit to stand: Complete Independence Stand to sit: Complete Independence  GAIT: Gait pattern: step through pattern, decreased arm swing- Right, decreased step length- Right, and wide BOS Distance walked: 50 ft x 2 Assistive device utilized: None Level of assistance: Modified independence Comments: Gait velocity:  11.22 sec = 2.92 ft/sec  FUNCTIONAL TESTS:  5 times sit to stand: 12.28 sec Functional gait assessment: 23/30 SLS:  3.69 RLE, 5.09 sec Tandem:  18.72 sec LLE posterior; 30 sec RLE posterior  M-CTSIB  Condition 1: Firm Surface, EO 30 Sec, Normal Sway  Condition 2: Firm Surface, EC 30 Sec, Mild Sway  Condition 3: Foam Surface, EO 30 Sec, Mild Sway  Condition 4: Foam Surface, EC 30 Sec, Moderate Sway     VESTIBULAR ASSESSMENT:  GENERAL OBSERVATION: Pt in no acute distress   SYMPTOM BEHAVIOR:  Subjective history: Reports hx of vestibular migraines that come on several times per year.  Reports that this happened several weeks ago and residual symptoms (weakness, spinning, nausea) last 8-10 days  this time.  No symptoms at eval today  Non-Vestibular symptoms: nausea/vomiting  Type of dizziness: Imbalance (Disequilibrium) and Unsteady with head/body turns  Frequency: Nothing today; frequency is  variable   Duration: symptoms can last days  Aggravating factors: Induced by position change: supine to sit, Induced by motion: activity in general, and reports this is vestibular-migraine related  Relieving factors: head stationary and rest  Progression  of symptoms: better   *Did not perform oculomotor or positional testing today, due to time constraints with pt arriving late; also, pt reports he would like to primarily focus on balance today, as dizziness symptoms have resolved.  PATIENT EDUCATION: Education details: PT eval results, POC Person educated: Patient Education method: Explanation Education comprehension: verbalized understanding  HOME EXERCISE PROGRAM:  Not yet initiated  GOALS: Goals reviewed with patient? Yes  SHORT TERM GOALS: Target date: 08/20/2022   Pt will be independent with HEP for improved balance and gait. Baseline: Goal status: IN PROGRESS  2.  Further vestibular testing to be assessed, goal to be written as appropriate. Baseline:  Goal status: IN PROGRESS   LONG TERM GOALS: Target date: 09/03/2022    Pt will be independent with HEP for improved balance and gait. Baseline:  Goal status: IN PROGRESS  2.  Pt will improve FGA score to at least 26/30 to decrease fall risk.  Baseline: 23/30 Goal status: IN PROGRESS  3.  Pt will improve Condition 4 on MCTSIB to mild sway for improved vestibular system use for balance. Baseline: moderate sway 30 sec Goal status: IN PROGRESS   ASSESSMENT:  CLINICAL IMPRESSION: Continued to work on balance activities on compliant surfaces, as well as hip strengthening.  Pt continues to have difficulty with glut activation, needing cues for correct technique to avoid hip flexion compensation.  Pt has slightly increased difficulty with RLE coordination on compliant surfaces and with SLS.  He will continue to benefit from skilled PT towards goals for improved overall functional mobility and decreased fall  risk.  OBJECTIVE IMPAIRMENTS: Abnormal gait, decreased balance, difficulty walking, decreased strength, and dizziness.   ACTIVITY LIMITATIONS: locomotion level  PARTICIPATION LIMITATIONS: community activity  PERSONAL FACTORS: 3+ comorbidities: See above  are also affecting patient's functional outcome.   REHAB POTENTIAL: Good  CLINICAL DECISION MAKING: Stable/uncomplicated  EVALUATION COMPLEXITY: Low   PLAN: PT FREQUENCY:  2x/wk for 3 weeks, then 1x/wk for 3 weeks  PT DURATION: other: see above  PLANNED INTERVENTIONS: Therapeutic exercises, Therapeutic activity, Neuromuscular re-education, Balance training, Gait training, Patient/Family education, Self Care, Vestibular training, and Canalith repositioning  PLAN FOR NEXT SESSION: Review updates to HEP; continue glut strengthening, continue anterior/posterior weightshifitng, continue to progress HEP for high level balance-SLS, tandem stance/gait; compliant surface/corner balance   Mady Haagensen, PT 08/11/22 1:15 PM Phone: [phone] Fax: [facility] [address], [place], [place] [address] Phone # [phone] Fax # [phone]

## 2022-08-12 NOTE — Therapy (Signed)
OUTPATIENT PHYSICAL THERAPY VESTIBULAR TREATMENT     Patient Name: Andrew Hayes MRN: [id number] DOB:16-Nov-1940, 81 y.o., male Today's Date: 08/13/2022   PT End of Session - 08/13/22 0930     Visit Number 6    Number of Visits 12    Date for PT Re-Evaluation 09/03/22    Authorization Type HTA    PT Start Time 0843    PT Stop Time 0928    PT Time Calculation (min) 45 min    Equipment Utilized During Treatment Gait belt    Activity Tolerance Patient tolerated treatment well    Behavior During Therapy WFL for tasks assessed/performed                 Past Medical History:  Diagnosis Date   Actinic keratoses    Adenomatous polyp of colon 2006   Arthritis    Diverticulosis    Mild   Elevated fasting glucose    Mildly   Herniated disc    Junctional nevus    Lipidemia    Mixed   Lyme disease 2020   pt placed on abx and stated he stopped taking vitamins so as not to interfere.   Mild sleep apnea    CPAP   Organic impotence    Plantar fasciitis    Pneumonia 1966   Prostate cancer [facility])    Vestibular migraine    Past Surgical History:  Procedure Laterality Date   APPENDECTOMY     basal cell carcinoma removal from head  2023   COLONOSCOPY     ELBOW SURGERY Left 2013   KNEE ARTHROSCOPY Bilateral 2013   knee and elbow   PLACEMENT OF LUMBAR DRAIN N/A 09/17/2019   Procedure: PLACEMENT OF LUMBAR DRAIN;  Surgeon: Judith Part, MD;  Location: [facility];  Service: Neurosurgery;  Laterality: N/A;  Lumbar drain placement   PROSTATECTOMY  1986   TONSILLECTOMY     VENTRICULOPERITONEAL SHUNT Right 01/07/2020   Procedure: Right ventriculoperitoneal shunt placement;  Surgeon: Judith Part, MD;  Location: [place];  Service: Neurosurgery;  Laterality: Right;   Patient Active Problem List   Diagnosis Date Noted   NPH (normal pressure hydrocephalus) ([place]) 01/07/2020   Normal pressure hydrocephalus ([place]) 09/17/2019   Dizziness 08/15/2019   Migraine without aura  and without status migrainosus, not intractable 08/15/2019   Vestibular migraine 06/25/2019   Fever and chills 05/28/2019   Impingement syndrome of left shoulder 08/10/2017   Chronic left shoulder pain 07/21/2017   Dysphagia 06/09/2015   History of colonic polyps 06/09/2015   Dizzy spells 03/08/2012   Hyperlipidemia 03/08/2012    PCP: [name], MD REFERRING PROVIDER: Melvenia Beam, MD  REFERRING DIAG: R42 (ICD-10-CM) - Vertigo  THERAPY DIAG:  Unsteadiness on feet  Other abnormalities of gait and mobility  Dizziness and giddiness  ONSET DATE: MD referral, 07/14/2022  Rationale for Evaluation and Treatment Rehabilitation  SUBJECTIVE:   SUBJECTIVE STATEMENT: Went fishing yesterday- it was cold.   Pt accompanied by: self  PERTINENT HISTORY: OSA treated with CPAP, NPD shunting, DAT scan for PD negative, hx of vertigo, vestibular migraines,   PAIN:  Are you having pain? No  PRECAUTIONS: Fall  WEIGHT BEARING RESTRICTIONS: No  FALLS: Has patient fallen in last 6 months? No  LIVING ENVIRONMENT: Lives with: lives with their spouse Lives in: House/apartment Stairs: Yes: Internal: flight of  steps; on right going up and External: 3-4 steps; on right going up Has following equipment at home:  None  PLOF: Independent and Leisure: goes to gym several times per week Enjoys golf and fly fishing  PATIENT GOALS: To have exercises to combat this balance thing.  OBJECTIVE:     TODAY'S TREATMENT: 08/13/22 Activity Comments  review HEP updates: standing hip ABD red TB 10x each standing hip EX red TB 10x each Cueing to look straight ahead  step up + opposite SKTC 15x each  Cueing to wean UE support- required light UE support throughout; more instability on R LE  fwd/back stepping + head nods to targets  10x each  Mild-mod instability and CGA-min A required   figure 8 turns with and without ball toss  Cueing to increase speed; some hesitation   [place]  Heavy  cueing and demonstration for proper sequencing  backwards step ups on foam Heavy cueing to intermittently look down for safe foot placement     PATIENT EDUCATION: Education details: HEP update and review- patient requested adding bridge into HEP today; edu on 3 balance systems for balance  Person educated: Patient Education method: Explanation, Demonstration, Tactile cues, Verbal cues, and Handouts Education comprehension: verbalized understanding and returned demonstration  HOME EXERCISE PROGRAM Last updated: 08/13/22 Access Code: [email or web address] URL: [email or web address] Date: 08/13/2022 Prepared by: [facility]  Exercises - Romberg Stance Eyes Closed on Foam Pad  - 1 x daily - 5 x weekly - 2-3 sets - 30 sec hold - Narrow Stance with Eyes Closed and Head Rotation on Foam Pad  - 1 x daily - 5 x weekly - 2-3 sets - 30 sec hold - Romberg Stance with Head Nods on Foam Pad  - 1 x daily - 5 x weekly - 2-3 sets - 30 sec hold - Alternating Step Forward with Support  - 1 x daily - 5 x weekly - 2 sets - 10 reps - Standing Anterior Posterior Weight Shift  - 1 x daily - 5 x weekly - 2 sets - 10 reps - Standing Hip Abduction with Resistance at Ankles and Counter Support  - 1 x daily - 7 x weekly - 3 sets - 10 reps - Standing Hip Extension with Resistance at Ankles and Counter Support  - 1 x daily - 7 x weekly - 3 sets - 10 reps - Supine Bridge  - 1 x daily - 5 x weekly - 2 sets - 10 reps     ------------------------------------------------------------------------------  TREATMENT: 07/28/22   VESTIBULAR ASSESSMENT   GENERAL OBSERVATION: pt is wearing bifocals which he wears all the time     OCULOMOTOR EXAM:   Ocular Alignment:  slight L esotropia   Ocular ROM:  reduced L eye adduction   Spontaneous Nystagmus: absent   Gaze-Induced Nystagmus: absent   Smooth Pursuits: intact   Saccades:  some difficulty following instruction, otherwise  normal   Convergence/Divergence: 3 cm    VESTIBULAR - OCULAR REFLEX:    Slow VOR: Normal c/o some neck discomfort    VOR Cancellation: Normal   Head-Impulse Test: unable to successfully test d/t guarding; negative with self-HIT B     POSITIONAL TESTING:  Right Roll Test: negative Left Roll Test: negative Right Dix-Hallpike: negative Left Dix-Hallpike: negative *increased saccadic intrusions present throughout but no nystagmus or dizziness     Activity Comments  Romberg EC foam 30" Mod-severe sway  Standing wide on foam + head turns 30" Mod-severe sway  Standing wide on foam + head nods 30" Mod-severe sway  Fwd/back stepping with  1 foot on foam 10x each 1 UE support d/t imbalance          HOME EXERCISE PROGRAM Last updated: 07/28/22 Access Code: [email or web address] URL: [email or web address] Date: 07/28/2022 Prepared by: [facility]  Exercises - Romberg Stance Eyes Closed on Foam Pad  - 1 x daily - 5 x weekly - 2-3 sets - 30 sec hold - Narrow Stance with Eyes Closed and Head Rotation on Foam Pad  - 1 x daily - 5 x weekly - 2-3 sets - 30 sec hold - Romberg Stance with Head Nods on Foam Pad  - 1 x daily - 5 x weekly - 2-3 sets - 30 sec hold - Alternating Step Forward with Support  - 1 x daily - 5 x weekly - 2 sets - 10 reps  PATIENT EDUCATION: Education details: edu on exam findings, discussion/review of patient's fitness/balance program at home, answering patient's questions, HEP Person educated: Patient Education method: Explanation, Demonstration, Tactile cues, Verbal cues, and Handouts Education comprehension: verbalized understanding and returned demonstration   -------------------------------------------------------------------- Below measures were taken at time of initial evaluation unless otherwise specified:   DIAGNOSTIC FINDINGS: DatScan negative (12/2021), MRI for cervical spine is scheduled  COGNITION: Overall  cognitive status: Within functional limits for tasks assessed   SENSATION: Light touch: WFL Describes bottom of feet as "spongy" POSTURE:  rounded shoulders, forward head, posterior pelvic tilt, and able to correct  Cervical ROM:  WFL rotation, flexion, extension  Active A/PROM (deg) eval  Flexion   Extension   Right lateral flexion   Left lateral flexion   Right rotation   Left rotation   (Blank rows = not tested)  STRENGTH: see below  LOWER EXTREMITY MMT:   MMT Right eval Left eval  Hip flexion 5 5  Hip abduction 5 5  Hip adduction 5 5  Hip internal rotation    Hip external rotation    Knee flexion 5 5  Knee extension 5 5  Ankle dorsiflexion 4 4  Ankle plantarflexion    Ankle inversion 4 5  Ankle eversion 5 5  (Blank rows = not tested)    TRANSFERS: Assistive device utilized: None  Sit to stand: Complete Independence Stand to sit: Complete Independence  GAIT: Gait pattern: step through pattern, decreased arm swing- Right, decreased step length- Right, and wide BOS Distance walked: 50 ft x 2 Assistive device utilized: None Level of assistance: Modified independence Comments: Gait velocity:  11.22 sec = 2.92 ft/sec  FUNCTIONAL TESTS:  5 times sit to stand: 12.28 sec Functional gait assessment: 23/30 SLS:  3.69 RLE, 5.09 sec Tandem:  18.72 sec LLE posterior; 30 sec RLE posterior  M-CTSIB  Condition 1: Firm Surface, EO 30 Sec, Normal Sway  Condition 2: Firm Surface, EC 30 Sec, Mild Sway  Condition 3: Foam Surface, EO 30 Sec, Mild Sway  Condition 4: Foam Surface, EC 30 Sec, Moderate Sway     VESTIBULAR ASSESSMENT:  GENERAL OBSERVATION: Pt in no acute distress   SYMPTOM BEHAVIOR:  Subjective history: Reports hx of vestibular migraines that come on several times per year.  Reports that this happened several weeks ago and residual symptoms (weakness, spinning, nausea) last 8-10 days  this time.  No symptoms at eval today  Non-Vestibular symptoms:  nausea/vomiting  Type of dizziness: Imbalance (Disequilibrium) and Unsteady with head/body turns  Frequency: Nothing today; frequency is variable   Duration: symptoms can last days  Aggravating factors: Induced by position change:  supine to sit, Induced by motion: activity in general, and reports this is vestibular-migraine related  Relieving factors: head stationary and rest  Progression of symptoms: better   *Did not perform oculomotor or positional testing today, due to time constraints with pt arriving late; also, pt reports he would like to primarily focus on balance today, as dizziness symptoms have resolved.  PATIENT EDUCATION: Education details: PT eval results, POC Person educated: Patient Education method: Explanation Education comprehension: verbalized understanding  HOME EXERCISE PROGRAM:  Not yet initiated  GOALS: Goals reviewed with patient? Yes  SHORT TERM GOALS: Target date: 08/20/2022   Pt will be independent with HEP for improved balance and gait. Baseline: Goal status: IN PROGRESS  2.  Further vestibular testing to be assessed, goal to be written as appropriate. Baseline:  Goal status: IN PROGRESS   LONG TERM GOALS: Target date: 09/03/2022    Pt will be independent with HEP for improved balance and gait. Baseline:  Goal status: IN PROGRESS  2.  Pt will improve FGA score to at least 26/30 to decrease fall risk.  Baseline: 23/30 Goal status: IN PROGRESS  3.  Pt will improve Condition 4 on MCTSIB to mild sway for improved vestibular system use for balance. Baseline: moderate sway 30 sec Goal status: IN PROGRESS   ASSESSMENT:  CLINICAL IMPRESSION: Patient arrived to session without complaints. Reviewed HEP update which revealed good carryover. Balance activities focusing on stepping strategy and SLS revealed some instability. Difficulty with coordinating multidirectional stepping and following instruction for alternating stepping activities. Patient  required increased cueing and demo for this. Also instructed on looking at where feet are being placed before stepping for max safety. Patient reported understanding of all edu provided and without complaints at end of session.    OBJECTIVE IMPAIRMENTS: Abnormal gait, decreased balance, difficulty walking, decreased strength, and dizziness.   ACTIVITY LIMITATIONS: locomotion level  PARTICIPATION LIMITATIONS: community activity  PERSONAL FACTORS: 3+ comorbidities: See above  are also affecting patient's functional outcome.   REHAB POTENTIAL: Good  CLINICAL DECISION MAKING: Stable/uncomplicated  EVALUATION COMPLEXITY: Low   PLAN: PT FREQUENCY:  2x/wk for 3 weeks, then 1x/wk for 3 weeks  PT DURATION: other: see above  PLANNED INTERVENTIONS: Therapeutic exercises, Therapeutic activity, Neuromuscular re-education, Balance training, Gait training, Patient/Family education, Self Care, Vestibular training, and [place] repositioning  PLAN FOR NEXT SESSION:  continue glut strengthening, continue anterior/posterior weightshifitng, continue to progress HEP for high level balance-SLS, tandem stance/gait; compliant surface/corner balance    Janene Harvey, PT, DPT 08/13/22 9:32 AM  [facility] at [facility] [address], [place], [place] [address] Phone # [phone] Fax # [phone]

## 2022-08-13 ENCOUNTER — Encounter: Payer: Self-pay | Admitting: Physical Therapy

## 2022-08-13 ENCOUNTER — Ambulatory Visit: Payer: PPO | Admitting: Physical Therapy

## 2022-08-13 DIAGNOSIS — R2689 Other abnormalities of gait and mobility: Secondary | ICD-10-CM

## 2022-08-13 DIAGNOSIS — R2681 Unsteadiness on feet: Secondary | ICD-10-CM | POA: Diagnosis not present

## 2022-08-13 DIAGNOSIS — R42 Dizziness and giddiness: Secondary | ICD-10-CM

## 2022-08-18 NOTE — Therapy (Signed)
OUTPATIENT PHYSICAL THERAPY VESTIBULAR TREATMENT     Patient Name: Andrew Hayes MRN: [id number] DOB:11-29-1940, 81 y.o., male Today's Date: 08/20/2022   PT End of Session - 08/20/22 1036     Visit Number 7    Number of Visits 12    Date for PT Re-Evaluation 09/03/22    Authorization Type HTA    PT Start Time [phone]   pt late   PT Stop Time 0932    PT Time Calculation (min) 40 min    Equipment Utilized During Treatment Gait belt    Activity Tolerance Patient tolerated treatment well    Behavior During Therapy WFL for tasks assessed/performed                  Past Medical History:  Diagnosis Date   Actinic keratoses    Adenomatous polyp of colon 2006   Arthritis    Diverticulosis    Mild   Elevated fasting glucose    Mildly   Herniated disc    Junctional nevus    Lipidemia    Mixed   Lyme disease 2020   pt placed on abx and stated he stopped taking vitamins so as not to interfere.   Mild sleep apnea    CPAP   Organic impotence    Plantar fasciitis    Pneumonia 1966   Prostate cancer [facility])    Vestibular migraine    Past Surgical History:  Procedure Laterality Date   APPENDECTOMY     basal cell carcinoma removal from head  2023   COLONOSCOPY     ELBOW SURGERY Left 2013   KNEE ARTHROSCOPY Bilateral 2013   knee and elbow   PLACEMENT OF LUMBAR DRAIN N/A 09/17/2019   Procedure: PLACEMENT OF LUMBAR DRAIN;  Surgeon: Judith Part, MD;  Location: [place];  Service: Neurosurgery;  Laterality: N/A;  Lumbar drain placement   PROSTATECTOMY  1986   TONSILLECTOMY     VENTRICULOPERITONEAL SHUNT Right 01/07/2020   Procedure: Right ventriculoperitoneal shunt placement;  Surgeon: Judith Part, MD;  Location: [place];  Service: Neurosurgery;  Laterality: Right;   Patient Active Problem List   Diagnosis Date Noted   NPH (normal pressure hydrocephalus) ([place]) 01/07/2020   Normal pressure hydrocephalus ([place]) 09/17/2019   Dizziness 08/15/2019   Migraine  without aura and without status migrainosus, not intractable 08/15/2019   Vestibular migraine 06/25/2019   Fever and chills 05/28/2019   Impingement syndrome of left shoulder 08/10/2017   Chronic left shoulder pain 07/21/2017   Dysphagia 06/09/2015   History of colonic polyps 06/09/2015   Dizzy spells 03/08/2012   Hyperlipidemia 03/08/2012    PCP: [name], MD REFERRING PROVIDER: Melvenia Beam, MD  REFERRING DIAG: R42 (ICD-10-CM) - Vertigo  THERAPY DIAG:  Unsteadiness on feet  Other abnormalities of gait and mobility  Dizziness and giddiness  ONSET DATE: MD referral, 07/14/2022  Rationale for Evaluation and Treatment Rehabilitation  SUBJECTIVE:   SUBJECTIVE STATEMENT: Went fishing and can't tell a different when he's fishing.   Pt accompanied by: self  PERTINENT HISTORY: OSA treated with CPAP, NPD shunting, DAT scan for PD negative, hx of vertigo, vestibular migraines,   PAIN:  Are you having pain? No  PRECAUTIONS: Fall  WEIGHT BEARING RESTRICTIONS: No  FALLS: Has patient fallen in last 6 months? No  LIVING ENVIRONMENT: Lives with: lives with their spouse Lives in: House/apartment Stairs: Yes: Internal: flight of  steps; on right going up and External: 3-4 steps; on  right going up Has following equipment at home: None  PLOF: Independent and Leisure: goes to gym several times per week Enjoys golf and fly fishing  PATIENT GOALS: To have exercises to combat this balance thing.  OBJECTIVE:    TODAY'S TREATMENT: 08/19/22 Activity Comments  toe tap to multidirectional cones  Cueing to maintain hips square, to stand on single LE throughout; 1 UE support required   gait + head turns/nods  Fairly good stability and coordination with head nods; much more difficulty with head turn  4 square step, then isolated practice on backwards steps Started with single walking pole, then with CGA only; cueing to shift onto stabilizing LE first before stepping 2nd  foot over   Wall bumps hip and shoulder strategy Review of shoulder strategy form wall- difficulty  Side step ups on bosu    Cueing to pause once standing on top to wean UE support and maintain balance; in II bars    PATIENT EDUCATION: Education details: answered patients questions on POC; HEP update Person educated: Patient Education method: Explanation, Demonstration, Tactile cues, Verbal cues, and Handouts Education comprehension: verbalized understanding and returned demonstration   HOME EXERCISE PROGRAM Last updated: 08/20/22 Access Code: [email or web address] URL: [email or web address] Date: 08/20/2022 Prepared by: [facility]  Exercises - Romberg Stance Eyes Closed on Foam Pad  - 1 x daily - 5 x weekly - 2-3 sets - 30 sec hold - Narrow Stance with Eyes Closed and Head Rotation on Foam Pad  - 1 x daily - 5 x weekly - 2-3 sets - 30 sec hold - Romberg Stance with Head Nods on Foam Pad  - 1 x daily - 5 x weekly - 2-3 sets - 30 sec hold - Alternating Step Forward with Support  - 1 x daily - 5 x weekly - 2 sets - 10 reps - Standing Anterior Posterior Weight Shift  - 1 x daily - 5 x weekly - 2 sets - 10 reps - Standing Hip Abduction with Resistance at Ankles and Counter Support  - 1 x daily - 7 x weekly - 3 sets - 10 reps - Standing Hip Extension with Resistance at Ankles and Counter Support  - 1 x daily - 7 x weekly - 3 sets - 10 reps - Supine Bridge  - 1 x daily - 5 x weekly - 2 sets - 10 reps - Gait with Head Nods and Turns on Grass  - 1 x daily - 5 x weekly - 2 sets - 10 reps     ------------------------------------------------------------------------------  TREATMENT: 07/28/22   VESTIBULAR ASSESSMENT   GENERAL OBSERVATION: pt is wearing bifocals which he wears all the time     OCULOMOTOR EXAM:   Ocular Alignment:  slight L esotropia   Ocular ROM:  reduced L eye adduction   Spontaneous Nystagmus: absent   Gaze-Induced  Nystagmus: absent   Smooth Pursuits: intact   Saccades:  some difficulty following instruction, otherwise normal   Convergence/Divergence: 3 cm    VESTIBULAR - OCULAR REFLEX:    Slow VOR: Normal c/o some neck discomfort    VOR Cancellation: Normal   Head-Impulse Test: unable to successfully test d/t guarding; negative with self-HIT B     POSITIONAL TESTING:  Right Roll Test: negative Left Roll Test: negative Right Dix-Hallpike: negative Left Dix-Hallpike: negative *increased saccadic intrusions present throughout but no nystagmus or dizziness     Activity Comments  Romberg EC foam 30" Mod-severe sway  Standing wide on foam + head turns 30" Mod-severe sway  Standing wide on foam + head nods 30" Mod-severe sway  Fwd/back stepping with 1 foot on foam 10x each 1 UE support d/t imbalance          HOME EXERCISE PROGRAM Last updated: 07/28/22 Access Code: [email or web address] URL: [email or web address] Date: 07/28/2022 Prepared by: [facility]  Exercises - Romberg Stance Eyes Closed on Foam Pad  - 1 x daily - 5 x weekly - 2-3 sets - 30 sec hold - Narrow Stance with Eyes Closed and Head Rotation on Foam Pad  - 1 x daily - 5 x weekly - 2-3 sets - 30 sec hold - Romberg Stance with Head Nods on Foam Pad  - 1 x daily - 5 x weekly - 2-3 sets - 30 sec hold - Alternating Step Forward with Support  - 1 x daily - 5 x weekly - 2 sets - 10 reps  PATIENT EDUCATION: Education details: edu on exam findings, discussion/review of patient's fitness/balance program at home, answering patient's questions, HEP Person educated: Patient Education method: Explanation, Demonstration, Tactile cues, Verbal cues, and Handouts Education comprehension: verbalized understanding and returned demonstration   -------------------------------------------------------------------- Below measures were taken at time of initial evaluation unless otherwise  specified:   DIAGNOSTIC FINDINGS: DatScan negative (12/2021), MRI for cervical spine is scheduled  COGNITION: Overall cognitive status: Within functional limits for tasks assessed   SENSATION: Light touch: WFL Describes bottom of feet as "spongy" POSTURE:  rounded shoulders, forward head, posterior pelvic tilt, and able to correct  Cervical ROM:  WFL rotation, flexion, extension  Active A/PROM (deg) eval  Flexion   Extension   Right lateral flexion   Left lateral flexion   Right rotation   Left rotation   (Blank rows = not tested)  STRENGTH: see below  LOWER EXTREMITY MMT:   MMT Right eval Left eval  Hip flexion 5 5  Hip abduction 5 5  Hip adduction 5 5  Hip internal rotation    Hip external rotation    Knee flexion 5 5  Knee extension 5 5  Ankle dorsiflexion 4 4  Ankle plantarflexion    Ankle inversion 4 5  Ankle eversion 5 5  (Blank rows = not tested)    TRANSFERS: Assistive device utilized: None  Sit to stand: Complete Independence Stand to sit: Complete Independence  GAIT: Gait pattern: step through pattern, decreased arm swing- Right, decreased step length- Right, and wide BOS Distance walked: 50 ft x 2 Assistive device utilized: None Level of assistance: Modified independence Comments: Gait velocity:  11.22 sec = 2.92 ft/sec  FUNCTIONAL TESTS:  5 times sit to stand: 12.28 sec Functional gait assessment: 23/30 SLS:  3.69 RLE, 5.09 sec Tandem:  18.72 sec LLE posterior; 30 sec RLE posterior  M-CTSIB  Condition 1: Firm Surface, EO 30 Sec, Normal Sway  Condition 2: Firm Surface, EC 30 Sec, Mild Sway  Condition 3: Foam Surface, EO 30 Sec, Mild Sway  Condition 4: Foam Surface, EC 30 Sec, Moderate Sway     VESTIBULAR ASSESSMENT:  GENERAL OBSERVATION: Pt in no acute distress   SYMPTOM BEHAVIOR:  Subjective history: Reports hx of vestibular migraines that come on several times per year.  Reports that this happened several weeks ago and residual  symptoms (weakness, spinning, nausea) last 8-10 days  this time.  No symptoms at eval today  Non-Vestibular symptoms: nausea/vomiting  Type of dizziness: Imbalance (Disequilibrium) and  Unsteady with head/body turns  Frequency: Nothing today; frequency is variable   Duration: symptoms can last days  Aggravating factors: Induced by position change: supine to sit, Induced by motion: activity in general, and reports this is vestibular-migraine related  Relieving factors: head stationary and rest  Progression of symptoms: better   *Did not perform oculomotor or positional testing today, due to time constraints with pt arriving late; also, pt reports he would like to primarily focus on balance today, as dizziness symptoms have resolved.  PATIENT EDUCATION: Education details: PT eval results, POC Person educated: Patient Education method: Explanation Education comprehension: verbalized understanding  HOME EXERCISE PROGRAM:  Not yet initiated  GOALS: Goals reviewed with patient? Yes  SHORT TERM GOALS: Target date: 08/20/2022   Pt will be independent with HEP for improved balance and gait. Baseline: Goal status: IN PROGRESS  2.  Further vestibular testing to be assessed, goal to be written as appropriate. Baseline:  Goal status: IN PROGRESS   LONG TERM GOALS: Target date: 09/03/2022    Pt will be independent with HEP for improved balance and gait. Baseline:  Goal status: IN PROGRESS  2.  Pt will improve FGA score to at least 26/30 to decrease fall risk.  Baseline: 23/30 Goal status: IN PROGRESS  3.  Pt will improve Condition 4 on MCTSIB to mild sway for improved vestibular system use for balance. Baseline: moderate sway 30 sec Goal status: IN PROGRESS   ASSESSMENT:  CLINICAL IMPRESSION: Patient without new complaints at start of session. Worked on progressive dynamic balance activities incorporating head movements, multidirectional stepping, and compliant surfaces. Also  reviewed wall bumps from HEP for max understanding and sequencing- patient still with evident glute weakness making this exercise difficult for him. Coordinating head movement while walking was initially difficult d/t trouble isolating movements, but improved with practice. Patient tolerated session well and without complaints upon leaving.   OBJECTIVE IMPAIRMENTS: Abnormal gait, decreased balance, difficulty walking, decreased strength, and dizziness.   ACTIVITY LIMITATIONS: locomotion level  PARTICIPATION LIMITATIONS: community activity  PERSONAL FACTORS: 3+ comorbidities: See above  are also affecting patient's functional outcome.   REHAB POTENTIAL: Good  CLINICAL DECISION MAKING: Stable/uncomplicated  EVALUATION COMPLEXITY: Low   PLAN: PT FREQUENCY:  2x/wk for 3 weeks, then 1x/wk for 3 weeks  PT DURATION: other: see above  PLANNED INTERVENTIONS: Therapeutic exercises, Therapeutic activity, Neuromuscular re-education, Balance training, Gait training, Patient/Family education, Self Care, Vestibular training, and Canalith repositioning  PLAN FOR NEXT SESSION:  continue glut strengthening, continue anterior/posterior weightshifitng, continue to progress HEP for high level balance-SLS, tandem stance/gait; compliant surface/corner balance    Janene Harvey, PT, DPT 08/20/22 10:36 AM  [facility] at [facility] [address] [place], [place] [address] Phone # [phone] Fax # [phone]

## 2022-08-20 ENCOUNTER — Encounter: Payer: Self-pay | Admitting: Physical Therapy

## 2022-08-20 ENCOUNTER — Ambulatory Visit: Payer: PPO | Admitting: Physical Therapy

## 2022-08-20 DIAGNOSIS — R2681 Unsteadiness on feet: Secondary | ICD-10-CM

## 2022-08-20 DIAGNOSIS — R42 Dizziness and giddiness: Secondary | ICD-10-CM

## 2022-08-20 DIAGNOSIS — R2689 Other abnormalities of gait and mobility: Secondary | ICD-10-CM

## 2022-08-25 ENCOUNTER — Ambulatory Visit: Payer: PPO | Admitting: Physical Therapy

## 2022-08-31 NOTE — Therapy (Signed)
OUTPATIENT PHYSICAL THERAPY VESTIBULAR TREATMENT     Patient Name: Zoey Gilkeson MRN: [id number] DOB:May 21, 1941, 81 y.o., male Today's Date: 08/31/2022          Past Medical History:  Diagnosis Date   Actinic keratoses    Adenomatous polyp of colon 2006   Arthritis    Diverticulosis    Mild   Elevated fasting glucose    Mildly   Herniated disc    Junctional nevus    Lipidemia    Mixed   Lyme disease 2020   pt placed on abx and stated he stopped taking vitamins so as not to interfere.   Mild sleep apnea    CPAP   Organic impotence    Plantar fasciitis    Pneumonia 1966   Prostate cancer [facility])    Vestibular migraine    Past Surgical History:  Procedure Laterality Date   APPENDECTOMY     basal cell carcinoma removal from head  2023   COLONOSCOPY     ELBOW SURGERY Left 2013   KNEE ARTHROSCOPY Bilateral 2013   knee and elbow   PLACEMENT OF LUMBAR DRAIN N/A 09/17/2019   Procedure: PLACEMENT OF LUMBAR DRAIN;  Surgeon: Judith Part, MD;  Location: [place];  Service: Neurosurgery;  Laterality: N/A;  Lumbar drain placement   PROSTATECTOMY  1986   TONSILLECTOMY     VENTRICULOPERITONEAL SHUNT Right 01/07/2020   Procedure: Right ventriculoperitoneal shunt placement;  Surgeon: Judith Part, MD;  Location: [place];  Service: Neurosurgery;  Laterality: Right;   Patient Active Problem List   Diagnosis Date Noted   NPH (normal pressure hydrocephalus) ([place]) 01/07/2020   Normal pressure hydrocephalus ([facility] [place]) 09/17/2019   Dizziness 08/15/2019   Migraine without aura and without status migrainosus, not intractable 08/15/2019   Vestibular migraine 06/25/2019   Fever and chills 05/28/2019   Impingement syndrome of left shoulder 08/10/2017   Chronic left shoulder pain 07/21/2017   Dysphagia 06/09/2015   History of colonic polyps 06/09/2015   Dizzy spells 03/08/2012   Hyperlipidemia 03/08/2012    PCP: [name], MD REFERRING PROVIDER: Melvenia Beam, MD  REFERRING DIAG: R42 (ICD-10-CM) - Vertigo  THERAPY DIAG:  No diagnosis found.  ONSET DATE: MD referral, 07/14/2022  Rationale for Evaluation and Treatment Rehabilitation  SUBJECTIVE:   SUBJECTIVE STATEMENT: Went fishing and can't tell a different when he's fishing.   Pt accompanied by: self  PERTINENT HISTORY: OSA treated with CPAP, NPD shunting, DAT scan for PD negative, hx of vertigo, vestibular migraines,   PAIN:  Are you having pain? No  PRECAUTIONS: Fall  WEIGHT BEARING RESTRICTIONS: No  FALLS: Has patient fallen in last 6 months? No  LIVING ENVIRONMENT: Lives with: lives with their spouse Lives in: House/apartment Stairs: Yes: Internal: flight of  steps; on right going up and External: 3-4 steps; on right going up Has following equipment at home: None  PLOF: Independent and Leisure: goes to gym several times per week Enjoys golf and fly fishing  PATIENT GOALS: To have exercises to combat this balance thing.  OBJECTIVE:    TODAY'S TREATMENT: 09/01/22 Activity Comments                      HOME EXERCISE PROGRAM Last updated: 08/20/22 Access Code: [email or web address] URL: [email or web address] Date: 08/20/2022 Prepared by: [facility]  Exercises - Romberg Stance Eyes Closed on Foam Pad  - 1 x daily - 5 x  weekly - 2-3 sets - 30 sec hold - Narrow Stance with Eyes Closed and Head Rotation on Foam Pad  - 1 x daily - 5 x weekly - 2-3 sets - 30 sec hold - Romberg Stance with Head Nods on Foam Pad  - 1 x daily - 5 x weekly - 2-3 sets - 30 sec hold - Alternating Step Forward with Support  - 1 x daily - 5 x weekly - 2 sets - 10 reps - Standing Anterior Posterior Weight Shift  - 1 x daily - 5 x weekly - 2 sets - 10 reps - Standing Hip Abduction with Resistance at Ankles and Counter Support  - 1 x daily - 7 x weekly - 3 sets - 10 reps - Standing Hip Extension with Resistance at Ankles and Counter Support  - 1 x  daily - 7 x weekly - 3 sets - 10 reps - Supine Bridge  - 1 x daily - 5 x weekly - 2 sets - 10 reps - Gait with Head Nods and Turns on Grass  - 1 x daily - 5 x weekly - 2 sets - 10 reps     ------------------------------------------------------------------------------  TREATMENT: 07/28/22   VESTIBULAR ASSESSMENT   GENERAL OBSERVATION: pt is wearing bifocals which he wears all the time     OCULOMOTOR EXAM:   Ocular Alignment:  slight L esotropia   Ocular ROM:  reduced L eye adduction   Spontaneous Nystagmus: absent   Gaze-Induced Nystagmus: absent   Smooth Pursuits: intact   Saccades:  some difficulty following instruction, otherwise normal   Convergence/Divergence: 3 cm    VESTIBULAR - OCULAR REFLEX:    Slow VOR: Normal c/o some neck discomfort    VOR Cancellation: Normal   Head-Impulse Test: unable to successfully test d/t guarding; negative with self-HIT B     POSITIONAL TESTING:  Right Roll Test: negative Left Roll Test: negative Right Dix-Hallpike: negative Left Dix-Hallpike: negative *increased saccadic intrusions present throughout but no nystagmus or dizziness     Activity Comments  Romberg EC foam 30" Mod-severe sway  Standing wide on foam + head turns 30" Mod-severe sway  Standing wide on foam + head nods 30" Mod-severe sway  Fwd/back stepping with 1 foot on foam 10x each 1 UE support d/t imbalance          HOME EXERCISE PROGRAM Last updated: 07/28/22 Access Code: [email or web address] URL: [email or web address] Date: 07/28/2022 Prepared by: [facility]  Exercises - Romberg Stance Eyes Closed on Foam Pad  - 1 x daily - 5 x weekly - 2-3 sets - 30 sec hold - Narrow Stance with Eyes Closed and Head Rotation on Foam Pad  - 1 x daily - 5 x weekly - 2-3 sets - 30 sec hold - Romberg Stance with Head Nods on Foam Pad  - 1 x daily - 5 x weekly - 2-3 sets - 30 sec hold - Alternating Step Forward with Support  - 1 x daily  - 5 x weekly - 2 sets - 10 reps  PATIENT EDUCATION: Education details: edu on exam findings, discussion/review of patient's fitness/balance program at home, answering patient's questions, HEP Person educated: Patient Education method: Explanation, Demonstration, Tactile cues, Verbal cues, and Handouts Education comprehension: verbalized understanding and returned demonstration   -------------------------------------------------------------------- Below measures were taken at time of initial evaluation unless otherwise specified:   DIAGNOSTIC FINDINGS: DatScan negative (12/2021), MRI for cervical spine is scheduled  COGNITION: Overall  cognitive status: Within functional limits for tasks assessed   SENSATION: Light touch: WFL Describes bottom of feet as "spongy" POSTURE:  rounded shoulders, forward head, posterior pelvic tilt, and able to correct  Cervical ROM:  WFL rotation, flexion, extension  Active A/PROM (deg) eval  Flexion   Extension   Right lateral flexion   Left lateral flexion   Right rotation   Left rotation   (Blank rows = not tested)  STRENGTH: see below  LOWER EXTREMITY MMT:   MMT Right eval Left eval  Hip flexion 5 5  Hip abduction 5 5  Hip adduction 5 5  Hip internal rotation    Hip external rotation    Knee flexion 5 5  Knee extension 5 5  Ankle dorsiflexion 4 4  Ankle plantarflexion    Ankle inversion 4 5  Ankle eversion 5 5  (Blank rows = not tested)    TRANSFERS: Assistive device utilized: None  Sit to stand: Complete Independence Stand to sit: Complete Independence  GAIT: Gait pattern: step through pattern, decreased arm swing- Right, decreased step length- Right, and wide BOS Distance walked: 50 ft x 2 Assistive device utilized: None Level of assistance: Modified independence Comments: Gait velocity:  11.22 sec = 2.92 ft/sec  FUNCTIONAL TESTS:  5 times sit to stand: 12.28 sec Functional gait assessment: 23/30 SLS:  3.69 RLE,  5.09 sec Tandem:  18.72 sec LLE posterior; 30 sec RLE posterior  M-CTSIB  Condition 1: Firm Surface, EO 30 Sec, Normal Sway  Condition 2: Firm Surface, EC 30 Sec, Mild Sway  Condition 3: Foam Surface, EO 30 Sec, Mild Sway  Condition 4: Foam Surface, EC 30 Sec, Moderate Sway     VESTIBULAR ASSESSMENT:  GENERAL OBSERVATION: Pt in no acute distress   SYMPTOM BEHAVIOR:  Subjective history: Reports hx of vestibular migraines that come on several times per year.  Reports that this happened several weeks ago and residual symptoms (weakness, spinning, nausea) last 8-10 days  this time.  No symptoms at eval today  Non-Vestibular symptoms: nausea/vomiting  Type of dizziness: Imbalance (Disequilibrium) and Unsteady with head/body turns  Frequency: Nothing today; frequency is variable   Duration: symptoms can last days  Aggravating factors: Induced by position change: supine to sit, Induced by motion: activity in general, and reports this is vestibular-migraine related  Relieving factors: head stationary and rest  Progression of symptoms: better   *Did not perform oculomotor or positional testing today, due to time constraints with pt arriving late; also, pt reports he would like to primarily focus on balance today, as dizziness symptoms have resolved.  PATIENT EDUCATION: Education details: PT eval results, POC Person educated: Patient Education method: Explanation Education comprehension: verbalized understanding  HOME EXERCISE PROGRAM:  Not yet initiated  GOALS: Goals reviewed with patient? Yes  SHORT TERM GOALS: Target date: 08/20/2022   Pt will be independent with HEP for improved balance and gait. Baseline: Goal status: IN PROGRESS  2.  Further vestibular testing to be assessed, goal to be written as appropriate. Baseline: no vestibular deficits identified  Goal status: MET   LONG TERM GOALS: Target date: 09/03/2022    Pt will be independent with HEP for improved balance  and gait. Baseline:  Goal status: IN PROGRESS  2.  Pt will improve FGA score to at least 26/30 to decrease fall risk.  Baseline: 23/30 Goal status: IN PROGRESS  3.  Pt will improve Condition 4 on MCTSIB to mild sway for improved vestibular system use for  balance. Baseline: moderate sway 30 sec Goal status: IN PROGRESS   ASSESSMENT:  CLINICAL IMPRESSION: Patient without new complaints at start of session. Worked on progressive dynamic balance activities incorporating head movements, multidirectional stepping, and compliant surfaces. Also reviewed wall bumps from HEP for max understanding and sequencing- patient still with evident glute weakness making this exercise difficult for him. Coordinating head movement while walking was initially difficult d/t trouble isolating movements, but improved with practice. Patient tolerated session well and without complaints upon leaving.   OBJECTIVE IMPAIRMENTS: Abnormal gait, decreased balance, difficulty walking, decreased strength, and dizziness.   ACTIVITY LIMITATIONS: locomotion level  PARTICIPATION LIMITATIONS: community activity  PERSONAL FACTORS: 3+ comorbidities: See above  are also affecting patient's functional outcome.   REHAB POTENTIAL: Good  CLINICAL DECISION MAKING: Stable/uncomplicated  EVALUATION COMPLEXITY: Low   PLAN: PT FREQUENCY:  2x/wk for 3 weeks, then 1x/wk for 3 weeks  PT DURATION: other: see above  PLANNED INTERVENTIONS: Therapeutic exercises, Therapeutic activity, Neuromuscular re-education, Balance training, Gait training, Patient/Family education, Self Care, Vestibular training, and [place] repositioning  PLAN FOR NEXT SESSION:  continue glut strengthening, continue anterior/posterior weightshifitng, continue to progress HEP for high level balance-SLS, tandem stance/gait; compliant surface/corner balance    Janene Harvey, PT, DPT 08/31/22 8:20 AM  [facility] at [facility] [address], [place], [place] [address] Phone # [phone] Fax # [phone]

## 2022-09-01 ENCOUNTER — Encounter: Payer: Self-pay | Admitting: Physical Therapy

## 2022-09-01 ENCOUNTER — Ambulatory Visit: Payer: PPO | Admitting: Physical Therapy

## 2022-09-01 DIAGNOSIS — R2681 Unsteadiness on feet: Secondary | ICD-10-CM

## 2022-09-01 DIAGNOSIS — R42 Dizziness and giddiness: Secondary | ICD-10-CM

## 2022-09-01 DIAGNOSIS — R2689 Other abnormalities of gait and mobility: Secondary | ICD-10-CM

## 2022-09-16 DIAGNOSIS — D044 Carcinoma in situ of skin of scalp and neck: Secondary | ICD-10-CM | POA: Diagnosis not present

## 2022-10-12 ENCOUNTER — Encounter: Payer: Self-pay | Admitting: Neurology

## 2022-10-13 DIAGNOSIS — G43001 Migraine without aura, not intractable, with status migrainosus: Secondary | ICD-10-CM | POA: Diagnosis not present

## 2022-10-13 NOTE — Telephone Encounter (Signed)
That's fine

## 2022-10-13 NOTE — Telephone Encounter (Signed)
Order for Depacon 1 gram IV x 1 pending MM NP signature. Sent message to pt via mychart. Per Maudie Mercury RN w/ intrafusion, availability today for infusion.

## 2022-12-02 DIAGNOSIS — M79641 Pain in right hand: Secondary | ICD-10-CM | POA: Diagnosis not present

## 2022-12-02 DIAGNOSIS — R238 Other skin changes: Secondary | ICD-10-CM | POA: Diagnosis not present

## 2022-12-02 DIAGNOSIS — M67441 Ganglion, right hand: Secondary | ICD-10-CM | POA: Diagnosis not present

## 2022-12-02 DIAGNOSIS — D485 Neoplasm of uncertain behavior of skin: Secondary | ICD-10-CM | POA: Diagnosis not present

## 2022-12-15 DIAGNOSIS — Z6826 Body mass index (BMI) 26.0-26.9, adult: Secondary | ICD-10-CM | POA: Diagnosis not present

## 2022-12-15 DIAGNOSIS — G629 Polyneuropathy, unspecified: Secondary | ICD-10-CM | POA: Diagnosis not present

## 2022-12-15 DIAGNOSIS — M6780 Other specified disorders of synovium and tendon, unspecified site: Secondary | ICD-10-CM | POA: Diagnosis not present

## 2022-12-28 ENCOUNTER — Other Ambulatory Visit: Payer: Self-pay

## 2022-12-28 ENCOUNTER — Encounter: Payer: Self-pay | Admitting: Orthopaedic Surgery

## 2022-12-28 ENCOUNTER — Ambulatory Visit: Payer: PPO | Admitting: Orthopaedic Surgery

## 2022-12-28 VITALS — BP 164/84 | Ht 71.0 in | Wt 185.0 lb

## 2022-12-28 DIAGNOSIS — M79641 Pain in right hand: Secondary | ICD-10-CM | POA: Diagnosis not present

## 2022-12-28 NOTE — Progress Notes (Signed)
Office Visit Note   Patient: Andrew Hayes           Date of Birth: 22-May-1941           MRN: [id number] Visit Date: 12/28/2022              Requested by: [name], [place],  [place] [address] PCP: [name], MD   Assessment & Plan: Visit Diagnoses:  1. Pain in right hand     Plan: Palmar ganglion currently not symptomatic.  If he develops increased size or pain he can return for aspiration.  He will return if he has any  Follow-Up Instructions: No follow-ups on file.   Orders:  Orders Placed This Encounter  Procedures   XR Hand Complete Right   No orders of the defined types were placed in this encounter.     Procedures: No procedures performed   Clinical Data: No additional findings.   Subjective: Chief Complaint  Patient presents with   Right Hand - Pain    HPI 82 year old male with recent development of a cyst over his right hand adjacent to the A1 pulley without active triggering.  He is still flyfishing regularly.  Is some question about hydrocephalus had a shunt placed problems with headaches afterwards he did not have elevated pressures and shunt was tied off.  No history of Dupuytren's.  Review of Systems all systems are noncontributory to HPI.   Objective: Vital Signs: BP (!) 164/84   Ht 5\' 11"  (1.803 m)   Wt 185 lb (83.9 kg)   BMI 25.80 kg/m   Physical Exam Constitutional:      Appearance: He is well-developed.  HENT:     Head: Normocephalic and atraumatic.     Right Ear: External ear normal.     Left Ear: External ear normal.  Eyes:     Pupils: Pupils are equal, round, and reactive to light.  Neck:     Thyroid: No thyromegaly.     Trachea: No tracheal deviation.  Cardiovascular:     Rate and Rhythm: Normal rate.  Pulmonary:     Effort: Pulmonary effort is normal.     Breath sounds: No wheezing.  Abdominal:     General: Bowel sounds are normal.     Palpations: Abdomen is soft.   Musculoskeletal:     Cervical back: Neck supple.  Skin:    General: Skin is warm and dry.     Capillary Refill: Capillary refill takes less than 2 seconds.  Neurological:     Mental Status: He is alert and oriented to person, place, and time.  Psychiatric:        Behavior: Behavior normal.        Thought Content: Thought content normal.        Judgment: Judgment normal.     Ortho Exam small palpable ganglion cyst adjacent to the A1 pulley without active triggering.  Sensation hand is intact.  Specialty Comments:  No specialty comments available.  Imaging: No results found.   PMFS History: Patient Active Problem List   Diagnosis Date Noted   NPH (normal pressure hydrocephalus) ([place]) 01/07/2020   Normal pressure hydrocephalus ([place]) 09/17/2019   Dizziness 08/15/2019   Migraine without aura and without status migrainosus, not intractable 08/15/2019   Vestibular migraine 06/25/2019   Fever and chills 05/28/2019   Impingement syndrome of left shoulder 08/10/2017   Chronic left shoulder pain 07/21/2017   Dysphagia 06/09/2015  History of colonic polyps 06/09/2015   Dizzy spells 03/08/2012   Hyperlipidemia 03/08/2012   Past Medical History:  Diagnosis Date   Actinic keratoses    Adenomatous polyp of colon 2006   Arthritis    Diverticulosis    Mild   Elevated fasting glucose    Mildly   Herniated disc    Junctional nevus    Lipidemia    Mixed   Lyme disease 2020   pt placed on abx and stated he stopped taking vitamins so as not to interfere.   Mild sleep apnea    CPAP   Organic impotence    Plantar fasciitis    Pneumonia 1966   Prostate cancer ([place])    Vestibular migraine     Family History  Problem Relation Age of Onset   Stroke Mother    Hypertension Mother    Parkinson's disease Mother    COPD Father    Parkinson's disease Sister    Congestive Heart Failure Sister    Dementia Brother    Hydrocephalus Brother    Parkinson's disease Brother    Heart  attack Maternal Grandfather        50's   Other Paternal Grandmother        chilbirth   Heart attack Paternal Grandfather        25's   Heart disease Daughter        deceased at 64 month   Colon cancer Neg Hx    Migraines Neg Hx        none to his knowledge   Neuropathy Neg Hx     Past Surgical History:  Procedure Laterality Date   APPENDECTOMY     basal cell carcinoma removal from head  2023   COLONOSCOPY     ELBOW SURGERY Left 2013   KNEE ARTHROSCOPY Bilateral 2013   knee and elbow   PLACEMENT OF LUMBAR DRAIN N/A 09/17/2019   Procedure: PLACEMENT OF LUMBAR DRAIN;  Surgeon: Judith Part, MD;  Location: [place];  Service: Neurosurgery;  Laterality: N/A;  Lumbar drain placement   PROSTATECTOMY  1986   TONSILLECTOMY     VENTRICULOPERITONEAL SHUNT Right 01/07/2020   Procedure: Right ventriculoperitoneal shunt placement;  Surgeon: Judith Part, MD;  Location: [place];  Service: Neurosurgery;  Laterality: Right;   Social History   Occupational History   Occupation: Manufacturing engineer: RETIRED    Comment: retired  Tobacco Use   Smoking status: Former    Packs/day: 2.00    Years: 24.00    Additional pack years: 0.00    Total pack years: 48.00    Types: Cigarettes    Quit date: 10/11/1984    Years since quitting: 38.2   Smokeless tobacco: Never   Tobacco comments:    briefly used smokeless tobacco  Vaping Use   Vaping Use: Never used  Substance and Sexual Activity   Alcohol use: Not Currently    Alcohol/week: 2.0 - 3.0 standard drinks of alcohol    Types: 2 - 3 Cans of beer per week    Comment: 2-3 beers per week, currently none   Drug use: No   Sexual activity: Not on file

## 2023-01-07 DIAGNOSIS — J029 Acute pharyngitis, unspecified: Secondary | ICD-10-CM | POA: Diagnosis not present

## 2023-01-07 DIAGNOSIS — Z03818 Encounter for observation for suspected exposure to other biological agents ruled out: Secondary | ICD-10-CM | POA: Diagnosis not present

## 2023-01-07 DIAGNOSIS — R051 Acute cough: Secondary | ICD-10-CM | POA: Diagnosis not present

## 2023-01-07 DIAGNOSIS — R5383 Other fatigue: Secondary | ICD-10-CM | POA: Diagnosis not present

## 2023-01-07 DIAGNOSIS — J069 Acute upper respiratory infection, unspecified: Secondary | ICD-10-CM | POA: Diagnosis not present

## 2023-01-12 DIAGNOSIS — G629 Polyneuropathy, unspecified: Secondary | ICD-10-CM | POA: Diagnosis not present

## 2023-01-12 DIAGNOSIS — Z6826 Body mass index (BMI) 26.0-26.9, adult: Secondary | ICD-10-CM | POA: Diagnosis not present

## 2023-01-12 DIAGNOSIS — R2689 Other abnormalities of gait and mobility: Secondary | ICD-10-CM | POA: Diagnosis not present

## 2023-01-12 DIAGNOSIS — R531 Weakness: Secondary | ICD-10-CM | POA: Diagnosis not present

## 2023-01-26 DIAGNOSIS — Z85828 Personal history of other malignant neoplasm of skin: Secondary | ICD-10-CM | POA: Diagnosis not present

## 2023-01-26 DIAGNOSIS — L578 Other skin changes due to chronic exposure to nonionizing radiation: Secondary | ICD-10-CM | POA: Diagnosis not present

## 2023-01-26 DIAGNOSIS — L570 Actinic keratosis: Secondary | ICD-10-CM | POA: Diagnosis not present

## 2023-01-26 DIAGNOSIS — L821 Other seborrheic keratosis: Secondary | ICD-10-CM | POA: Diagnosis not present

## 2023-01-26 DIAGNOSIS — D225 Melanocytic nevi of trunk: Secondary | ICD-10-CM | POA: Diagnosis not present

## 2023-01-26 DIAGNOSIS — Z86018 Personal history of other benign neoplasm: Secondary | ICD-10-CM | POA: Diagnosis not present

## 2023-01-26 DIAGNOSIS — M67449 Ganglion, unspecified hand: Secondary | ICD-10-CM | POA: Diagnosis not present

## 2023-02-02 ENCOUNTER — Encounter: Payer: Self-pay | Admitting: Adult Health

## 2023-02-02 ENCOUNTER — Ambulatory Visit: Payer: PPO | Admitting: Adult Health

## 2023-02-02 VITALS — BP 102/69 | HR 85 | Ht 71.0 in | Wt 189.6 lb

## 2023-02-02 DIAGNOSIS — G43809 Other migraine, not intractable, without status migrainosus: Secondary | ICD-10-CM | POA: Diagnosis not present

## 2023-02-02 DIAGNOSIS — G4733 Obstructive sleep apnea (adult) (pediatric): Secondary | ICD-10-CM

## 2023-02-02 NOTE — Progress Notes (Signed)
PATIENT: Andrew Hayes DOB: 19-Feb-1941  REASON FOR VISIT: follow up HISTORY FROM: patient  Chief Complaint  Patient presents with   Follow-up    Pt in 20 Pt here for CPAP f/u  Pt states no questions or concerns for today's visit      HISTORY OF PRESENT ILLNESS: Today 02/02/23:  Andrew Hayes is a 82 y.o. male with a history of obstructive sleep apnea on CPAP. Returns today for follow-up.  Reports that the CPAP is working well for him.  He denies any new issues.  Only states that he gets about 2-3 vestibular migraines a year.  Last 1 was 3 months ago and he came in and got an infusion and found that beneficial.     04/21/22: Andrew Hayes is a 82 year old male with a history of OSA on CPAP.Reports that he has tried several mask and the nasal mask works best. At the last visit I sent order for pressure to be changed to autoset 7-15 but the pressure was never changed.   Reports that he gets vestibular migraines about 3-4 times a year.  Always gets vertigo and nauseous but not typically a headache. Has tried maxalt but not helpful.   Neuropathy: feels like he is walking sponges. No pain. Does therapy at home to help with his balance. No falls. Had NCV/EMG in 2016.    10/13/21: Andrew Hayes is a 82 year old male with a history of obstructive sleep apnea on CPAP.  He reports that he has had a hard time adjusting to the new machine.  Reports that his residual AHI is always high.  I will take data from August to September of 2022 it appears he uses machine 23 out of 38 days for compliance of 61%.  He uses machine greater than 4 hours 16 days.  Average usage is 5 hours and 1 minute.  His residual AHI is 13.6 on 7-13 cmH2O with max pressure at 13.4 L/min.  No significant leak.  I reviewed data from December to January.  He has used the machine only 4 days.  His residual AHI is 19.8 on 7 to 13 cm of water.  The patient has also been using his old machine I would like that data from  December to January patient uses machine 23 out of 30 days for compliance of 77%.  He uses machine on average 6 hours and 7 minutes.  His residual AHI is 13.4 on 10 cm of water.   01/22/20:Andrew Hayes is a 81 year old male with a history of OSA on CPAP and vestibular migraines. Reports CPAP works well for him. No issues.   Migraines: since shunt was turned off not having migraines.   Vestibular migraines: reports that he has not had a severe episode. Occasionally will have some dizziness but episode does not last long.  Balance issues: patient continues to report balance issues. No falls. Feels like his feet are sponges. Has tried PT. Feels that he has neuropathy. Denies numbness and tingling. Has had NCV/EMG in 2016 that was normal.     HISTORY:   02/05/20:   Andrew Hayes is a 82 year old male with a history of vestibular migraines and normal pressure hydrocephalus post shunting.  He saw Dr. Johnsie Cancel last week and his shunt was adjusted from her drainage.  He reports that since then his headaches have increased.  He states that he has a headache every day typically on the right side of the head  in the frontal region.  He reports the worst his pain is been is a 7-8/10.  Currently it is 4 out of 10.  He reports that he has been taking extra strength Tylenol on a daily basis.  Reports that his headache does improve within 30 to 40 minutes if he lays down.  He reports with activity the headache seems to come back.  He reports initially after having the shunt if he coughed had a bowel movement or lifting object he felt more pressure in the brain.  He reports that his hearing has also worsened on the right side.  He reports that he is having mild dizziness with his headaches.  With more severe dizziness he does have nausea and vomiting.  Denies photophobia and phonophobia.  He returns today for an evaluation.  REVIEW OF SYSTEMS: Out of a complete 14 system review of symptoms, the patient complains  only of the following symptoms, and all other reviewed systems are negative.   ESS 6  ALLERGIES: Allergies  Allergen Reactions   Percocet [Oxycodone-Acetaminophen] Other (See Comments)    Patient states he is unable to tolerate    HOME MEDICATIONS: Outpatient Medications Prior to Visit  Medication Sig Dispense Refill   atorvastatin (LIPITOR) 20 MG tablet Take 20 mg by mouth daily.     Cholecalciferol (VITAMIN D) 50 MCG (2000 UT) CAPS Take by mouth.     Doxylamine Succinate, Sleep, (UNISOM PO) Take by mouth at bedtime as needed.     gabapentin (NEURONTIN) 300 MG capsule Take 300 mg by mouth at bedtime. 1 capsule at bedtime     Multiple Vitamin (MULTIVITAMIN PO) Take by mouth.     ondansetron (ZOFRAN) 4 MG tablet Take 1 tablet (4 mg total) by mouth every 8 (eight) hours as needed for nausea or vomiting. (Patient taking differently: Take 4 mg by mouth as needed for nausea or vomiting.) 20 tablet 0   UNABLE TO FIND Med Name: supplement for neuropathy     No facility-administered medications prior to visit.    PAST MEDICAL HISTORY: Past Medical History:  Diagnosis Date   Actinic keratoses    Adenomatous polyp of colon 2006   Arthritis    Diverticulosis    Mild   Elevated fasting glucose    Mildly   Herniated disc    Junctional nevus    Lipidemia    Mixed   Lyme disease 2020   pt placed on abx and stated he stopped taking vitamins so as not to interfere.   Mild sleep apnea    CPAP   Organic impotence    Plantar fasciitis    Pneumonia 1966   Prostate cancer    Vestibular migraine     PAST SURGICAL HISTORY: Past Surgical History:  Procedure Laterality Date   APPENDECTOMY     basal cell carcinoma removal from head  2023   COLONOSCOPY     ELBOW SURGERY Left 2013   KNEE ARTHROSCOPY Bilateral 2013   knee and elbow   PLACEMENT OF LUMBAR DRAIN N/A 09/17/2019   Procedure: PLACEMENT OF LUMBAR DRAIN;  Surgeon: Jadene Pierini, MD;  Location: [facility] OR;  Service:  Neurosurgery;  Laterality: N/A;  Lumbar drain placement   PROSTATECTOMY  1986   TONSILLECTOMY     VENTRICULOPERITONEAL SHUNT Right 01/07/2020   Procedure: Right ventriculoperitoneal shunt placement;  Surgeon: Jadene Pierini, MD;  Location[facility] OR;  Service: Neurosurgery;  Laterality: Right;    FAMILY HISTORY: Family History  Problem Relation  Age of Onset   Stroke Mother    Hypertension Mother    Parkinson's disease Mother    COPD Father    Parkinson's disease Sister    Congestive Heart Failure Sister    Dementia Brother    Hydrocephalus Brother    Parkinson's disease Brother    Heart attack Maternal Grandfather        50's   Other Paternal Grandmother        chilbirth   Heart attack Paternal Grandfather        60's   Heart disease Daughter        deceased at 21 month   Colon cancer Neg Hx    Migraines Neg Hx        none to his knowledge   Neuropathy Neg Hx    Sleep apnea Neg Hx     SOCIAL HISTORY: Social History   Socioeconomic History   Marital status: Married    Spouse name: Steward Drone   Number of children: 2   Years of education: 16   Highest education level: Not on file  Occupational History   Occupation: Social worker: RETIRED    Comment: retired  Tobacco Use   Smoking status: Former    Packs/day: 2.00    Years: 24.00    Additional pack years: 0.00    Total pack years: 48.00    Types: Cigarettes    Quit date: 10/11/1984    Years since quitting: 38.3   Smokeless tobacco: Never   Tobacco comments:    briefly used smokeless tobacco  Vaping Use   Vaping Use: Never used  Substance and Sexual Activity   Alcohol use: Not Currently    Alcohol/week: 5.0 standard drinks of alcohol    Types: 5 Cans of beer per week    Comment: 2-3 beers per week, currently none   Drug use: No   Sexual activity: Not on file  Other Topics Concern   Not on file  Social History Narrative   Patient lives at home with family.   Patient consumes about 3 cups of caffeine  daily,   right handed.   2 children (1 deceased)   Social Determinants of Corporate investment banker Strain: Not on file  Food Insecurity: Not on file  Transportation Needs: Not on file  Physical Activity: Not on file  Stress: Not on file  Social Connections: Not on file  Intimate Partner Violence: Not on file      PHYSICAL EXAM  Vitals:   02/02/23 1403  BP: 102/69  Pulse: 85  Weight: 189 lb 9.6 oz (86 kg)  Height: 5\' 11"  (1.803 m)   Body mass index is 26.44 kg/m.  Generalized: Well developed, in no acute distress  Chest: Lungs clear to auscultation bilaterally  Neurological examination  Mentation: Alert oriented to time, place, history taking. Follows all commands speech and language fluent Cranial nerve II-XII: Extraocular movements were full, visual field were full on confrontational test Head turning and shoulder shrug  were normal and symmetric.  Motor: The motor testing reveals 5 over 5 strength of all 4 extremities. Good symmetric motor tone is noted throughout.  Sensory: Sensory testing is intact to soft touch on all 4 extremities Gait and station: Gait is normal.    DIAGNOSTIC DATA (LABS, IMAGING, TESTING) - I reviewed patient records, labs, notes, testing and imaging myself where available.  Lab Results  Component Value Date   WBC 5.5 06/23/2020   HGB 15.1  06/23/2020   HCT 44.1 06/23/2020   MCV 96.1 06/23/2020   PLT 230.0 06/23/2020      Component Value Date/Time   NA 140 06/23/2020 0936   NA 143 05/21/2019 0940   K 4.0 06/23/2020 0936   CL 105 06/23/2020 0936   CO2 25 06/23/2020 0936   GLUCOSE 104 (H) 06/23/2020 0936   BUN 21 06/23/2020 0936   BUN 21 05/21/2019 0940   CREATININE 1.05 06/23/2020 0936   CALCIUM 9.4 06/23/2020 0936   PROT 7.2 11/30/2021 0932   ALBUMIN 4.7 06/23/2020 0936   AST 25 06/23/2020 0936   ALT 20 06/23/2020 0936   ALKPHOS 84 06/23/2020 0936   BILITOT 2.1 (H) 07/09/2020 1332   GFRNONAA >60 01/07/2020 1932   GFRAA  >60 01/07/2020 1932    Lab Results  Component Value Date   VITAMINB12 1,197 08/15/2019   Lab Results  Component Value Date   TSH 1.950 11/30/2021      ASSESSMENT AND PLAN 82 y.o. year old male  has a past medical history of Actinic keratoses, Adenomatous polyp of colon (2006), Arthritis, Diverticulosis, Elevated fasting glucose, Herniated disc, Junctional nevus, Lipidemia, Lyme disease (2020), Mild sleep apnea, Organic impotence, Plantar fasciitis, Pneumonia (1966), Prostate cancer, and Vestibular migraine. here with:  OSA on CPAP  - CPAP compliance good -Residual AHI has improved - Encourage patient to use CPAP nightly and > 4 hours each night -Patient will contact us in 1 month for repeat download  2. Vestibular migraines/ Vertigo  -Stable in the future can do another infusion if needed   F/U in 1 year or sooner if needed    Butch Penny, MSN, NP-C 02/02/2023, 2:20 PM [facility] [address] [place], [place] [address] [phone]

## 2023-03-09 ENCOUNTER — Encounter: Payer: Self-pay | Admitting: Gastroenterology

## 2023-03-29 DIAGNOSIS — Z6826 Body mass index (BMI) 26.0-26.9, adult: Secondary | ICD-10-CM | POA: Diagnosis not present

## 2023-03-29 DIAGNOSIS — R030 Elevated blood-pressure reading, without diagnosis of hypertension: Secondary | ICD-10-CM | POA: Diagnosis not present

## 2023-03-29 DIAGNOSIS — R7301 Impaired fasting glucose: Secondary | ICD-10-CM | POA: Diagnosis not present

## 2023-03-29 DIAGNOSIS — R7303 Prediabetes: Secondary | ICD-10-CM | POA: Diagnosis not present

## 2023-04-27 ENCOUNTER — Ambulatory Visit: Payer: PPO | Admitting: Adult Health

## 2023-05-05 DIAGNOSIS — G6289 Other specified polyneuropathies: Secondary | ICD-10-CM | POA: Diagnosis not present

## 2023-05-05 DIAGNOSIS — G20A1 Parkinson's disease without dyskinesia, without mention of fluctuations: Secondary | ICD-10-CM | POA: Diagnosis not present

## 2023-05-05 DIAGNOSIS — R799 Abnormal finding of blood chemistry, unspecified: Secondary | ICD-10-CM | POA: Diagnosis not present

## 2023-05-05 DIAGNOSIS — E785 Hyperlipidemia, unspecified: Secondary | ICD-10-CM | POA: Diagnosis not present

## 2023-05-05 DIAGNOSIS — Z79899 Other long term (current) drug therapy: Secondary | ICD-10-CM | POA: Diagnosis not present

## 2023-05-05 DIAGNOSIS — I10 Essential (primary) hypertension: Secondary | ICD-10-CM | POA: Diagnosis not present

## 2023-05-30 DIAGNOSIS — G4733 Obstructive sleep apnea (adult) (pediatric): Secondary | ICD-10-CM | POA: Diagnosis not present

## 2023-05-30 DIAGNOSIS — G43909 Migraine, unspecified, not intractable, without status migrainosus: Secondary | ICD-10-CM | POA: Diagnosis not present

## 2023-05-30 DIAGNOSIS — E785 Hyperlipidemia, unspecified: Secondary | ICD-10-CM | POA: Diagnosis not present

## 2023-05-30 DIAGNOSIS — Z87891 Personal history of nicotine dependence: Secondary | ICD-10-CM | POA: Diagnosis not present

## 2023-05-30 DIAGNOSIS — E663 Overweight: Secondary | ICD-10-CM | POA: Diagnosis not present

## 2023-05-30 DIAGNOSIS — G912 (Idiopathic) normal pressure hydrocephalus: Secondary | ICD-10-CM | POA: Diagnosis not present

## 2023-06-03 DIAGNOSIS — Z8546 Personal history of malignant neoplasm of prostate: Secondary | ICD-10-CM | POA: Diagnosis not present

## 2023-06-03 DIAGNOSIS — E7800 Pure hypercholesterolemia, unspecified: Secondary | ICD-10-CM | POA: Diagnosis not present

## 2023-06-03 DIAGNOSIS — Z79899 Other long term (current) drug therapy: Secondary | ICD-10-CM | POA: Diagnosis not present

## 2023-06-06 DIAGNOSIS — G5601 Carpal tunnel syndrome, right upper limb: Secondary | ICD-10-CM | POA: Diagnosis not present

## 2023-06-06 DIAGNOSIS — G6289 Other specified polyneuropathies: Secondary | ICD-10-CM | POA: Diagnosis not present

## 2023-06-06 DIAGNOSIS — G20A1 Parkinson's disease without dyskinesia, without mention of fluctuations: Secondary | ICD-10-CM | POA: Diagnosis not present

## 2023-06-08 DIAGNOSIS — R21 Rash and other nonspecific skin eruption: Secondary | ICD-10-CM | POA: Diagnosis not present

## 2023-06-08 DIAGNOSIS — R7303 Prediabetes: Secondary | ICD-10-CM | POA: Diagnosis not present

## 2023-06-08 DIAGNOSIS — Z79899 Other long term (current) drug therapy: Secondary | ICD-10-CM | POA: Diagnosis not present

## 2023-06-08 DIAGNOSIS — R030 Elevated blood-pressure reading, without diagnosis of hypertension: Secondary | ICD-10-CM | POA: Diagnosis not present

## 2023-06-08 DIAGNOSIS — E7800 Pure hypercholesterolemia, unspecified: Secondary | ICD-10-CM | POA: Diagnosis not present

## 2023-06-08 DIAGNOSIS — G629 Polyneuropathy, unspecified: Secondary | ICD-10-CM | POA: Diagnosis not present

## 2023-06-08 DIAGNOSIS — Z0000 Encounter for general adult medical examination without abnormal findings: Secondary | ICD-10-CM | POA: Diagnosis not present

## 2023-06-08 DIAGNOSIS — Z6826 Body mass index (BMI) 26.0-26.9, adult: Secondary | ICD-10-CM | POA: Diagnosis not present

## 2023-06-14 DIAGNOSIS — L309 Dermatitis, unspecified: Secondary | ICD-10-CM | POA: Diagnosis not present

## 2023-07-07 DIAGNOSIS — D225 Melanocytic nevi of trunk: Secondary | ICD-10-CM | POA: Diagnosis not present

## 2023-07-07 DIAGNOSIS — Z86018 Personal history of other benign neoplasm: Secondary | ICD-10-CM | POA: Diagnosis not present

## 2023-07-07 DIAGNOSIS — M67449 Ganglion, unspecified hand: Secondary | ICD-10-CM | POA: Diagnosis not present

## 2023-07-07 DIAGNOSIS — L578 Other skin changes due to chronic exposure to nonionizing radiation: Secondary | ICD-10-CM | POA: Diagnosis not present

## 2023-07-07 DIAGNOSIS — Z85828 Personal history of other malignant neoplasm of skin: Secondary | ICD-10-CM | POA: Diagnosis not present

## 2023-07-07 DIAGNOSIS — L570 Actinic keratosis: Secondary | ICD-10-CM | POA: Diagnosis not present

## 2023-07-07 DIAGNOSIS — L821 Other seborrheic keratosis: Secondary | ICD-10-CM | POA: Diagnosis not present

## 2023-07-21 ENCOUNTER — Telehealth: Payer: Self-pay | Admitting: Adult Health

## 2023-07-21 DIAGNOSIS — G20A1 Parkinson's disease without dyskinesia, without mention of fluctuations: Secondary | ICD-10-CM

## 2023-07-21 NOTE — Telephone Encounter (Signed)
Pt called asking for an appointment, when asked pt stated he has a new diagnosis that he needs to see Dr Lucia Gaskins for.  Pt was told that re: a new diagnosis he will need a referral.  Pt then stated it is not a new diagnosis, he said Dr Lucia Gaskins ran test for Parkinson's, he states another Dr has ran some test, and advised him to schedule a f/u with Dr Lucia Gaskins.  Pt advised phone rep would send a message to RN to get better clarity so that an appropriate appointment could be set up for him, please call. Pt has requested it be Toma Copier, RN that gives him a call on Monday of next week.

## 2023-07-23 NOTE — Telephone Encounter (Signed)
Unfortunately I think he needs to get another opinion from a different neurologist, per my note I really didn;t know how else to help him and I think he should try to get anpther opinion, this is from my last note:   Patient is here again, unfortunately I am not quite sure what else to do we have tried so many different approaches to his complaints. Since seeing him we diagnosed with obstruvtive and central sleep apnea and follow him for cpap. We sent to [facility] for [facility]PH shunting, we have referred for vestibular physical therapy. DAT scan for parkinson's disease was negative. MRi of the brain was completed in the past. We have tried nerve blocks. He has seen audiology and ENT. We have consider migraine with vertigo aura and tried to treat him. We can order MRI cervical spine to ensure imbalance/ataxia not myelopathy or other and can try some of the new migraine cgrp medications.  Please inform him and his pcp office I think he needs another opinion thanks

## 2023-07-26 NOTE — Telephone Encounter (Signed)
I think he should be seen by a movement disorder specialist at [facility], I am not a movement disorder or parkinson's expert he would be beter served there with an expert in this disorder the academic centers have a lot more resources and can offer him more. Please let him know. I can also let dr Tenny Craw know but would prefer if he ask dr Tenny Craw for a referral to either [facility] or [facility] is excellent.

## 2023-07-26 NOTE — Telephone Encounter (Addendum)
I called the pt. He states he has a friend who saw a doctor at [facility] for neuropathy so he visited that doctor as well (Dr Jamelle Rushing).  He states the doctor eliminated neuropathy as a diagnosis but instead said he had early Parkinson's.  He understands the DaTscan was negative last year. The patient is wanting to be seen here in [place] by a provider so he can be followed.  He states his symptoms are not horrible but he would say they have worsened compared to previous.  He states his gait is worse.  His right side does not work as well and states it is probably worse as well.  He still has the spongy feeling in his feet.  He states he has trouble buttoning his shirts and can no longer tie his tie.  He states he is happy to see Dr. Lucia Gaskins or he would be interested in seeing a PD specialist.  He states a couple of his friends with PD see Dr. Arbutus Leas and he did request an appointment but it was declined because he is a patient of [facility].  I told him I would asked Dr. Lucia Gaskins to review and we will call him back.

## 2023-07-27 NOTE — Addendum Note (Signed)
Addended by: Bertram Savin on: 07/27/2023 11:19 AM   Modules accepted: Orders

## 2023-07-27 NOTE — Telephone Encounter (Signed)
Just refer to the movement disorder team for parkinson's disease. Also email him and ask him to let us know when he gets an appointment if it is very far out we can see him first but if he gets an appoitment quickly they are the best movement team I've seen! thanks

## 2023-07-27 NOTE — Telephone Encounter (Signed)
I spoke with the patient and let him know the message below from Dr Lucia Gaskins. I let him know she recommends a specialist in movement disorders/parkinson's. The patient is amenable and would prefer [facility]. He would prefer for Dr Lucia Gaskins to refer him. His questions were answered and he thanked me for the call and thanked Dr Lucia Gaskins for the recommendation. He will let us know if he doesn't hear from [facility] in 2 weeks regarding an appt.

## 2023-07-27 NOTE — Telephone Encounter (Signed)
I placed the referral to the [facility] movement disorders clinic. Can you call the patient and ask him to please let us know when he gets an appointment at [facility]? If it is very far out we can see him in between but Dr Lucia Gaskins does still recommend them and states they are the best movement team she's seen. Thanks

## 2023-08-01 ENCOUNTER — Telehealth: Payer: Self-pay | Admitting: Adult Health

## 2023-08-01 NOTE — Telephone Encounter (Signed)
Referral for neurology fax to [facility]. Phone: [phone], Fax: [phone].

## 2023-11-15 DIAGNOSIS — G20C Parkinsonism, unspecified: Secondary | ICD-10-CM | POA: Diagnosis not present

## 2023-11-15 DIAGNOSIS — G20A1 Parkinson's disease without dyskinesia, without mention of fluctuations: Secondary | ICD-10-CM | POA: Diagnosis not present

## 2023-12-09 DIAGNOSIS — G20C Parkinsonism, unspecified: Secondary | ICD-10-CM | POA: Diagnosis not present

## 2023-12-09 DIAGNOSIS — Z982 Presence of cerebrospinal fluid drainage device: Secondary | ICD-10-CM | POA: Diagnosis not present

## 2023-12-14 DIAGNOSIS — Z6827 Body mass index (BMI) 27.0-27.9, adult: Secondary | ICD-10-CM | POA: Diagnosis not present

## 2023-12-14 DIAGNOSIS — G479 Sleep disorder, unspecified: Secondary | ICD-10-CM | POA: Diagnosis not present

## 2023-12-14 DIAGNOSIS — G629 Polyneuropathy, unspecified: Secondary | ICD-10-CM | POA: Diagnosis not present

## 2023-12-19 DIAGNOSIS — Z982 Presence of cerebrospinal fluid drainage device: Secondary | ICD-10-CM | POA: Diagnosis not present

## 2023-12-19 DIAGNOSIS — G9389 Other specified disorders of brain: Secondary | ICD-10-CM | POA: Diagnosis not present

## 2023-12-19 DIAGNOSIS — G20C Parkinsonism, unspecified: Secondary | ICD-10-CM | POA: Diagnosis not present

## 2023-12-20 DIAGNOSIS — Z6825 Body mass index (BMI) 25.0-25.9, adult: Secondary | ICD-10-CM | POA: Diagnosis not present

## 2023-12-20 DIAGNOSIS — G912 (Idiopathic) normal pressure hydrocephalus: Secondary | ICD-10-CM | POA: Diagnosis not present

## 2024-01-06 DIAGNOSIS — G912 (Idiopathic) normal pressure hydrocephalus: Secondary | ICD-10-CM | POA: Diagnosis not present

## 2024-01-06 DIAGNOSIS — Z982 Presence of cerebrospinal fluid drainage device: Secondary | ICD-10-CM | POA: Diagnosis not present

## 2024-01-11 ENCOUNTER — Telehealth: Payer: Self-pay | Admitting: Adult Health

## 2024-01-11 NOTE — Telephone Encounter (Signed)
 LVM and sent mychart msg informing pt of need to reschedule 02/08/24 appt - NP out

## 2024-01-17 DIAGNOSIS — G912 (Idiopathic) normal pressure hydrocephalus: Secondary | ICD-10-CM | POA: Diagnosis not present

## 2024-01-17 DIAGNOSIS — Z982 Presence of cerebrospinal fluid drainage device: Secondary | ICD-10-CM | POA: Diagnosis not present

## 2024-01-24 DIAGNOSIS — G912 (Idiopathic) normal pressure hydrocephalus: Secondary | ICD-10-CM | POA: Diagnosis not present

## 2024-01-24 DIAGNOSIS — Z982 Presence of cerebrospinal fluid drainage device: Secondary | ICD-10-CM | POA: Diagnosis not present

## 2024-02-08 ENCOUNTER — Ambulatory Visit: Payer: PPO | Admitting: Adult Health

## 2024-02-08 DIAGNOSIS — D044 Carcinoma in situ of skin of scalp and neck: Secondary | ICD-10-CM | POA: Diagnosis not present

## 2024-02-08 DIAGNOSIS — M67449 Ganglion, unspecified hand: Secondary | ICD-10-CM | POA: Diagnosis not present

## 2024-02-08 DIAGNOSIS — L578 Other skin changes due to chronic exposure to nonionizing radiation: Secondary | ICD-10-CM | POA: Diagnosis not present

## 2024-02-08 DIAGNOSIS — D225 Melanocytic nevi of trunk: Secondary | ICD-10-CM | POA: Diagnosis not present

## 2024-02-08 DIAGNOSIS — L821 Other seborrheic keratosis: Secondary | ICD-10-CM | POA: Diagnosis not present

## 2024-02-08 DIAGNOSIS — D485 Neoplasm of uncertain behavior of skin: Secondary | ICD-10-CM | POA: Diagnosis not present

## 2024-02-08 DIAGNOSIS — Z85828 Personal history of other malignant neoplasm of skin: Secondary | ICD-10-CM | POA: Diagnosis not present

## 2024-02-08 DIAGNOSIS — Z86018 Personal history of other benign neoplasm: Secondary | ICD-10-CM | POA: Diagnosis not present

## 2024-02-08 DIAGNOSIS — L570 Actinic keratosis: Secondary | ICD-10-CM | POA: Diagnosis not present

## 2024-02-08 DIAGNOSIS — D2310 Other benign neoplasm of skin of unspecified eyelid, including canthus: Secondary | ICD-10-CM | POA: Diagnosis not present

## 2024-02-10 DIAGNOSIS — G912 (Idiopathic) normal pressure hydrocephalus: Secondary | ICD-10-CM | POA: Diagnosis not present

## 2024-02-10 DIAGNOSIS — M6281 Muscle weakness (generalized): Secondary | ICD-10-CM | POA: Diagnosis not present

## 2024-02-10 DIAGNOSIS — R2689 Other abnormalities of gait and mobility: Secondary | ICD-10-CM | POA: Diagnosis not present

## 2024-02-16 DIAGNOSIS — G912 (Idiopathic) normal pressure hydrocephalus: Secondary | ICD-10-CM | POA: Diagnosis not present

## 2024-02-16 DIAGNOSIS — M6281 Muscle weakness (generalized): Secondary | ICD-10-CM | POA: Diagnosis not present

## 2024-02-16 DIAGNOSIS — R2689 Other abnormalities of gait and mobility: Secondary | ICD-10-CM | POA: Diagnosis not present

## 2024-02-21 DIAGNOSIS — G912 (Idiopathic) normal pressure hydrocephalus: Secondary | ICD-10-CM | POA: Diagnosis not present

## 2024-02-21 DIAGNOSIS — G609 Hereditary and idiopathic neuropathy, unspecified: Secondary | ICD-10-CM | POA: Diagnosis not present

## 2024-02-21 DIAGNOSIS — Z982 Presence of cerebrospinal fluid drainage device: Secondary | ICD-10-CM | POA: Diagnosis not present

## 2024-02-29 DIAGNOSIS — C4442 Squamous cell carcinoma of skin of scalp and neck: Secondary | ICD-10-CM | POA: Diagnosis not present

## 2024-03-13 DIAGNOSIS — G912 (Idiopathic) normal pressure hydrocephalus: Secondary | ICD-10-CM | POA: Diagnosis not present

## 2024-03-13 DIAGNOSIS — R2689 Other abnormalities of gait and mobility: Secondary | ICD-10-CM | POA: Diagnosis not present

## 2024-03-13 DIAGNOSIS — M6281 Muscle weakness (generalized): Secondary | ICD-10-CM | POA: Diagnosis not present

## 2024-03-14 DIAGNOSIS — D239 Other benign neoplasm of skin, unspecified: Secondary | ICD-10-CM | POA: Diagnosis not present

## 2024-03-20 DIAGNOSIS — M6281 Muscle weakness (generalized): Secondary | ICD-10-CM | POA: Diagnosis not present

## 2024-03-20 DIAGNOSIS — R2689 Other abnormalities of gait and mobility: Secondary | ICD-10-CM | POA: Diagnosis not present

## 2024-03-20 DIAGNOSIS — G912 (Idiopathic) normal pressure hydrocephalus: Secondary | ICD-10-CM | POA: Diagnosis not present

## 2024-04-09 ENCOUNTER — Telehealth: Payer: Self-pay | Admitting: Adult Health

## 2024-04-09 NOTE — Telephone Encounter (Signed)
 Patient request appointment to discuss results from the test from [facility]

## 2024-04-09 NOTE — Telephone Encounter (Signed)
 Looks like Dr. Ines placed the order to movement specialist. She may want to review the note and then decide if he needs appointment

## 2024-04-16 NOTE — Telephone Encounter (Signed)
 Patient is already scheduled to see Dr. Ines for 08/28/24 at 1:30pm. Dr. Ines has some sooner openings in August so I LVM asking pt to call back to reschedule for a sooner appt

## 2024-04-16 NOTE — Telephone Encounter (Signed)
 Please schedule him for a follow up with me thanks

## 2024-05-24 ENCOUNTER — Telehealth: Payer: Self-pay | Admitting: Neurology

## 2024-05-24 ENCOUNTER — Ambulatory Visit: Admitting: Neurology

## 2024-05-24 ENCOUNTER — Encounter: Payer: Self-pay | Admitting: Neurology

## 2024-05-24 VITALS — BP 151/74 | HR 60 | Ht 71.0 in | Wt 189.8 lb

## 2024-05-24 DIAGNOSIS — R2689 Other abnormalities of gait and mobility: Secondary | ICD-10-CM

## 2024-05-24 DIAGNOSIS — G8929 Other chronic pain: Secondary | ICD-10-CM | POA: Diagnosis not present

## 2024-05-24 DIAGNOSIS — R29818 Other symptoms and signs involving the nervous system: Secondary | ICD-10-CM

## 2024-05-24 DIAGNOSIS — R270 Ataxia, unspecified: Secondary | ICD-10-CM

## 2024-05-24 DIAGNOSIS — R29898 Other symptoms and signs involving the musculoskeletal system: Secondary | ICD-10-CM

## 2024-05-24 DIAGNOSIS — M542 Cervicalgia: Secondary | ICD-10-CM

## 2024-05-24 DIAGNOSIS — R278 Other lack of coordination: Secondary | ICD-10-CM

## 2024-05-24 NOTE — Telephone Encounter (Signed)
 no auth required sent to [facility] [phone]

## 2024-05-24 NOTE — Patient Instructions (Addendum)
 MRI of the cervical spine  Spinal issues in the cervical spine can cause neck pain, numbness and tingling in the arms and hands, clumsiness in fine motor skills like buttoning a shirt, difficulty walking or maintaining balance.

## 2024-05-24 NOTE — Progress Notes (Signed)
 [facility]    Provider:  Dr Ines Requesting Provider: [name], MD Primary Care Provider:  [name], MD  CC:  normal pressure status post shunting still with problems of imbalance and ataxia, possible migraines  05/24/2024: 83 year old with a past medical history complicated by history of a diagnosis of NPH based on ventriculomegaly and subjective improvement after a tap test, s/p VP shunt placement which he turned off after 3 weeks because of headaches. It was done to improve his subjective gait imbalance. He was also given a diagnosis of mild polyneuropathy versus age-related changes. He had a DaTscan  in 2023 which is read as equivocal at [facility]. It was a normal quantitative DaTscan . He had a trial of Sinemet up to a month without any subjective improvement.    We have seen this patient in the past, see below for extensive history for imbalance, he was shunted for possible NPH, DaTscan  negative for Parkinson's disease, we have sent in for second opinions, last time I saw him in 2023 which is really was not quite sure what else to do, he has been [facility] and now he is back today.  He has seen the movement disorder physicians at [facility] Dr. Elayne who is unbelievably excellent, again I am not quite sure what else I can do to add to his care.  He saw Dr. Rosalia at [facility] about parkinsonism, and also saw a doctor about neuropathy. DAT scan was negative. MRI of the brain wa repeated and he saw neurosurgery about the NPH and he went to neurosurgery and they opened the shunt and he had the headache again. The headache was horrible. They turned the shunt off and the headache went away. He still has spongy feet, he still fishes and plays golf, discussed this is something he may have to live with. I don;t feel neuropathy is the cause. States he saw a neurologist for possible neuropathy and was tested for causes Dr. Stevens [facility] and he was tested, had an emg/ncs, tested for serum causes, borderline normal. He can hold a plank for more than a minute, 25 push ups, he can still play golf. Hs trouble with right hand, decreased fine motor symptoms,  Review emg/ncs 06/06/2023: Conclusions: emg/ncs Dr. Stevens, reviewed values and data and agree with her findings Borderline abnormal study. There is mild conduction velocity  slowing in the fibular nerve and borderline prolonged F-wave  latencies in all tested nerves -- these may be early findings in  a generalized polyneuropathy but could be normal for age. There  is evidence of a moderate right median neuropathy at the wrist  (carpal tunnel syndrome).   Patient complains of symptoms per HPI as well as the following symptoms: none . Pertinent negatives and positives per HPI. All others negative   07/21/2022: Patient is here again, unfortunately I am not quite sure what else to do we have tried so many different approaches to his complaints. Since seeing him we diagnosed with obstruvtive and central sleep apnea and follow him for cpap. We sent to [facility] for NPH shunting, we have referred for vestibular physical therapy. DAT scan for parkinson's disease was negative. MRi of the brain was completed in the past. We have tried nerve blocks. He has seen audiology and ENT. We have consider migraine with vertigo aura and tried to treat him. We can order MRI cervical spine to ensure imbalance/ataxia not myelopathy or other and can try some  of the new migraine cgrp medications.   He feels better. He had a rough spell, 2 weeks ago he started having a dizzy episode. He hasn't been to vestibular therapy yet but is going. We ordered an MRi cervical spine but it was not completed. We discussed in details.  Medications tried: Tylenol , aspirin, B12, Decadron  injections, Benadryl , coenzyme every 10, gabapentin, ibuprofen, meclizine , melatonin, zofran , nurtec, maxalt , imitrex , topamax ,  nortriptyline contraindicated due to age and risk of falls, blood pressure medications contraindicated due to hypotension.   Patient complains of symptoms per HPI as well as the following symptoms: imbalance . Pertinent negatives and positives per HPI. All others negative   Interval history November 30, 2021: This is a patient that is very well-known to the practice.  He is here again today with itchiness where the shunt is and continued imbalance.  We have seen him for multiple issues about the shunt.  He initially came to us  with imbalance, and brain imaging possibly consistent with NPH, his brother had NPH and dementia so he was very concerned and very eager to get a shunt, we sent him to Dr. Rockney who put in a temporary lumbar drain which made him feel considerably better so the shunt was placed, since he has had the shunting he has returned multiple times with multiple symptoms including headaches.  We have extensively evaluated him and placed the shunt to try and avoid NPH in him, but at this point I explained to him I have nothing more for him as far as the shunt goes, a little itchiness that doesn't bother him is minor, if the shunt is bothering him that much he can talk to Dr. Rockney to see if it can be removed and we can monitor him.  He is using his CPAP, last time I saw him in 2021 he felt improved with his vertigo and headache.  He has tingling and itching near the tube, not painful. He still feels imbalanced. He has sponginess in his feet. He still has that. He saw someone who diagnosed him and gave him infrared treatment. He says several people diagnosed him with peripheral neuropathy. Doesn't affect his life. No pain. Just a sponginess that is stable for years 5-6 years ago. He already saw Dr. Cheryle. He has some decreased fine motor in the right hand. Also still with imbalance. Can't tie his tie, started 5-6 ago, all stable. He practices balance.  He has neck pain, pain with  head extension, extensive hx of parkinson's disease in mother, brother had NPH AND had parkinson's diesease, and sister had Parkinson's disease. No hx of dopaminerig drugs. Mother had essential essential tremor. He feels like his handwriting is worse, shaky, he has to hold his arm, no smaller, no loss of taste or smell. He has difficulty swallowing and neck pain. Tremor is new. Walking has been slower, needs to think about it to walk faster. No significant back pain or radicular symptoms. No other focal neurologic deficits, associated symptoms, inciting events or modifiable factors.  Patient complains of symptoms per HPI as well as the following symptoms: tremor, slowing down, handwriting problems . Pertinent negatives and positives per HPI. All others negative  Personally reviewed images prior to appointment, additional 15 minutes. 02/07/2020: ct head: IMPRESSION: 1. Sequelae of right superior frontal approach CSF shunt placement. Stable ventricle size and configuration, no adverse features. 2. No new intracranial abnormality  12/2019 ct head: IMPRESSION: 1. Right frontal approach shunt catheter terminates near the foramina of [place].  Small amount of pneumocephalus at the right frontal pole. 2. Decreased size of the lateral ventricles compared to 07/07/2019.  Interval history 01/16/2020: He had the shunting but he developed a headache. The headache is more on the right side behind the eye socket and is throbbing but just an ache, just irritating now but it did get to 6-7/10. He started using his cpap again. He feels improved with his vertigo and headache. We can try nurtec and a nerve block, will not send to infusion as he looks completely comfortable. He is having more of his vestibular episodes, we discussed trying acute management medications and if it gets worse or more frequent trying a preventative. He used to go once a year or every 6 months, now its more monthly but not as severe. So we will try  nurtec. He will follow up with Dr. Cheryle to change the shunt settings.   Performed by Dr. Ines M.D. All procedures a documented blood were medically necessary, reasonable and appropriate based on the patient's history, medical diagnosis and physician opinion. Verbal informed consent was obtained from the patient, patient was informed of potential risk of procedure, including bruising, bleeding, hematoma formation, infection, muscle weakness, muscle pain, numbness, transient hypertension, transient hyperglycemia and transient insomnia among others. All areas injected were topically clean with isopropyl rubbing alcohol. Nonsterile nonlatex gloves were worn during the procedure.  1. Greater occipital nerve block [phone]). The greater occipital nerve site was identified at the nuchal line medial to the occipital artery. Medication was injected into the  right occipital nerve areas and suboccipital areas. Patient's condition is associated with inflammation of the greater occipital nerve and associated multiple groups. Injection was deemed medically necessary, reasonable and appropriate. Injection represents a separate and unique surgical service.  2. Supraorbital nerve block ([id number]): Supraorbital nerve site was identified along the incision of the frontal bone on the orbital/supraorbital ridge. Medication was injected into the right supraorbital nerve areas. Patient's condition is associated with inflammation of the supraorbital and associated muscle groups. Injection was deemed medically necessary, reasonable and appropriate. Injection represents a separate and unique surgical service.   Interval history January 15, 2020: Patient has undergone lumbar drain the last several days, he was discharged from the hospital just 2 days ago, I did discuss his case with Dr. Rockney who stated he had a headache in the hospital and adjusted his drain, patients here for headache.   Interval history 10/16/2019: Patient  for follow up after lumbar drain placement. Patient feels as though he improved as far as his balance, he definitely improved. It was not drastic but it was signigifant. I reminded him we are catching this early and so his improvement may not be as drastic as someone we diagnose in the later stages of NPH. Also discuss his episodes of dizziness may also be due to possible NPH ve vestibular migraines. We discussed this at length and he is going to see Dr Cheryle.  Interval history 08/15/2019: Patient with episodes of dizziness unclear etiology. Possibly central associated with migraines. He has had 2 episodes and treated with migraine IV infusion of Depacon. We discussed migraine preventative, he says meats with nitrates cause a lot of problems and triggers episodes, He doesn't have a problem with bacon but feels lunch meat affects him particularly salami. He does not have these often. 3 years ago he was having them every few months, now he has had a few more. He declines preventative, he will try to take it sooner  right at the onset he will take rizatriptan . We also discussed his MRI today, reviewed images, he has some memory problems, he feels his balance is poor. His brother has NPH. He feels his memory is impaired. Reviewed MRI images with him, showed him very large ventricles.  Interval history: Patient started experiencing vestibular symptoms several days ago. 2 days ago it put him in bed. Unclear if vestibular migraines or other etiology. Discussed we should take him to the IV infusion lab and treat him like a migraine and if symptoms resolve then this is a likely indication this is central in origin and not due to canaliths for example, I suspect vestibular migraines, discussed with patient and agreed on treatment. In the furute he will call then symptoms starts for 1g depacon, today his symptoms resolved afte rtreatment which included a fogginess, dizzines, not feeling right in the head,  imbalance.  HPI:  Andrew Hayes is a 83 y.o. male here as requested by Andrew Carlin Redbird, MD for dizziness. He was 83 years old with the first episode of vertigo, he has to lay down, a feeling of spinning and if he doesn't lay down perfectly flat keep his head on the bed he gets nauseated. Worst one was 4 years ago he was in bed for days and every time he got up he vomited. He has been evaluated by [facility] and multiple ENTs and diagnosed with vestibular migraines. He will episodes are a little dizziness, he takes rizatriptan  and it does get very bad after that, but he has a hangover and may not feel good for a few days but he can avoid the severe vertigo and vomiting. He is getting these little episodes more frequently 2x a month that last for 4-5 days. No inciting events, he has evaluated all possible triggers and none found. He even tried a migraine diet for 3-4 months recommended by [facility] and didn;t help. He never gets headaches with the episodes. He has been to vestibular therapy. He drinks enough water, he has had brain MRIs, He is having more frequent episodes, he feels his balance is impaired and sometimes he does not walk well, hearing changes. No Fhx of migraines.  Reviewed notes, labs and imaging from outside physicians, which showed:  IMPRESSION: MRI brain 2016 personally reviewed images 1. No imaging explanation for vestibular cochlear symptoms, as above. 2. Ventriculomegaly. Are there any symptoms of normal pressure hydrocephalus  Review of Systems: Patient complains of symptoms per HPI as well as the following symptoms: headache, incision pain. Pertinent negatives and positives per HPI. All others negative   Social History   Socioeconomic History   Marital status: Married    Spouse name: Erminio   Number of children: 2   Years of education: 16   Highest education level: Not on file  Occupational History   Occupation: Social worker: RETIRED    Comment: retired  Tobacco  Use   Smoking status: Former    Current packs/day: 0.00    Average packs/day: 2.0 packs/day for 24.0 years (48.0 ttl pk-yrs)    Types: Cigarettes    Start date: 10/11/1960    Quit date: 10/11/1984    Years since quitting: 39.6   Smokeless tobacco: Never   Tobacco comments:    briefly used smokeless tobacco  Vaping Use   Vaping status: Never Used  Substance and Sexual Activity   Alcohol use: Yes    Alcohol/week: 4.0 standard drinks of alcohol    Types:  4 Cans of beer per week    Comment: 2-3 beers per week, currently none   Drug use: No   Sexual activity: Not on file  Other Topics Concern   Not on file  Social History Narrative   Patient lives at home with family.   Patient consumes about 3 cups of caffeine daily,   right handed.   2 children (1 deceased)   Retired    Chief Executive Officer D[employer] of Community education officer: Not on [employer] Insecurity: Not on file  Transportation Needs: Not on file  Physical Activity: Not on file  Stress: Not on file  Social Connections: Not on file  Intimate Partner Violence: Not on file    Family History  Problem Relation Age of Onset   Stroke Mother    Hypertension Mother    Parkinson's disease Mother    COPD Father    Parkinson's disease Sister    Congestive Heart Failure Sister    Dementia Brother    Hydrocephalus Brother    Parkinson's disease Brother    Heart attack Maternal Grandfather        50's   Other Paternal Grandmother        chilbirth   Heart attack Paternal Grandfather        60's   Heart disease Daughter        deceased at 35 month   Colon cancer Neg Hx    Migraines Neg Hx        none to his knowledge   Neuropathy Neg Hx    Sleep apnea Neg Hx     Past Medical History:  Diagnosis Date   Actinic keratoses    Adenomatous polyp of colon 2006   Arthritis    Diverticulosis    Mild   Elevated fasting glucose    Mildly   Herniated disc    Junctional nevus    Lipidemia    Mixed   Lyme disease 2020   pt  placed on abx and stated he stopped taking vitamins so as not to interfere.   Mild sleep apnea    CPAP   Organic impotence    Plantar fasciitis    Pneumonia 1966   Prostate cancer [facility])    Vestibular migraine     Patient Active Problem List   Diagnosis Date Noted   NPH (normal pressure hydrocephalus) (HCC) 01/07/2020   Normal pressure hydrocephalus (HCC) 09/17/2019   Dizziness 08/15/2019   Migraine without aura and without status migrainosus, not intractable 08/15/2019   Vestibular migraine 06/25/2019   Fever and chills 05/28/2019   Impingement syndrome of left shoulder 08/10/2017   Chronic left shoulder pain 07/21/2017   Dysphagia 06/09/2015   History of colonic polyps 06/09/2015   Dizzy spells 03/08/2012   Hyperlipidemia 03/08/2012    Past Surgical History:  Procedure Laterality Date   APPENDECTOMY     basal cell carcinoma removal from head  2023   COLONOSCOPY     ELBOW SURGERY Left 2013   KNEE ARTHROSCOPY Bilateral 2013   knee and elbow   PLACEMENT OF LUMBAR DRAIN N/A 09/17/2019   Procedure: PLACEMENT OF LUMBAR DRAIN;  Surgeon: Cheryle Debby LABOR, MD;  Location: [facility] OR;  Service: Neurosurgery;  Laterality: N/A;  Lumbar drain placement   PROSTATECTOMY  1986   TONSILLECTOMY     VENTRICULOPERITONEAL SHUNT Right 01/07/2020   Procedure: Right ventriculoperitoneal shunt placement;  Surgeon: Cheryle Debby LABOR, MD;  Location: [facility] OR;  Service: Neurosurgery;  Laterality: Right;    Current Outpatient Medications  Medication Sig Dispense Refill   atorvastatin (LIPITOR) 20 MG tablet Take 20 mg by mouth daily.     Cholecalciferol (VITAMIN D) 50 MCG (2000 UT) CAPS Take by mouth.     gabapentin (NEURONTIN) 300 MG capsule Take 300 mg by mouth at bedtime. 1 capsule at bedtime     melatonin 3 MG TABS tablet Take 3 mg by mouth at bedtime.     Multiple Vitamin (MULTIVITAMIN PO) Take by mouth.     ondansetron (ZOFRAN) 4 MG tablet Take 1 tablet (4 mg total) by mouth every 8 (eight)  hours as needed for nausea or vomiting. (Patient taking differently: Take 4 mg by mouth as needed for nausea or vomiting.) 20 tablet 0   UNABLE TO FIND Med Name: supplement for neuropathy     Doxylamine Succinate, Sleep, (UNISOM PO) Take by mouth at bedtime as needed.     No current facility-administered medications for this visit.    Allergies as of 05/24/2024 - Review Complete 05/24/2024  Allergen Reaction Noted   Percocet [oxycodone-acetaminophen] Other (See Comments) 05/23/2015    Vitals: BP (!) 151/74   Pulse 60   Ht 5' 11 (1.803 m)   Wt 189 lb 12.8 oz (86.1 kg)   BMI 26.47 kg/m  Last Weight:  Wt Readings from Last 1 Encounters:  05/24/24 189 lb 12.8 oz (86.1 kg)   Last Height:   Ht Readings from Last 1 Encounters:  05/24/24 5' 11 (1.803 m)    Physical exam: Exam: Gen: NAD, conversant      CV: No palpitations or chest pain or SOB. VS: Breathing at a normal rate. Weight normal. Not febrile. Eyes: Conjunctivae clear without exudates or hemorrhage  Neuro: Detailed Neurologic Exam  Speech:    Speech is normal; fluent and spontaneous with normal comprehension.  Cognition:    The patient is oriented to person, place, and time;     recent and remote memory intact;     language fluent;     normal attention, concentration, fund of knowledge Cranial Nerves:    The pupils are equal, round, and reactive to light. Visual fields are full Extraocular movements are intact.  The face is symmetric with normal sensation. The palate elevates in the midline. Hearing intact. Voice is normal. Shoulder shrug is normal. The tongue has normal motion without fasciculations.   Coordination: clumsiness  Motor Observation:   no involuntary movements noted. Tone:    normal  Posture:    Posture is normal. normal erect    Strength: Weakness right proximal arm     Sensation: impaired distally to all modalities in a gradient fashion pp, temp, vibration few secs, impaired  proprioception at toes.      Gait: slightly wide based, low clearance  Reflex Exam: absent Ajs, otherwise brisk   Assessment/Plan:  05/24/2024: 83 year old with a past medical history complicated by history of a diagnosis of NPH based on ventriculomegaly and subjective improvement after a tap test, s/p VP shunt placement which he turned off after 3 weeks because of headaches. It was done to improve his subjective gait imbalance. He was also given a diagnosis of mild polyneuropathy versus age-related changes. He had a DaTscan in 2023 which is read as equivocal at [facility]. It was a normal quantitative DaTscan. He had a trial of Sinemet up to a month without any subjective improvement.    We have seen this patient in the past, see below for  extensive history for imbalance, he was shunted for possible NPH, DaTscan  negative for Parkinson's disease, we have sent in for second opinions, last time I saw him in 2023 which is really was not quite sure what else to do, he has been [facility] and now he is back today.  He has seen the movement disorder physicians at [facility] Dr. Elayne who is excellent and a well-known known specialist in the [place] . We have tried so many different approaches to his complaints. Since seeing him we diagnosed with obstruvtive and central sleep apnea and follow him for cpap. We sent to [facility] for NPH shunting as above, we have referred for vestibular physical therapy. DAT scan for parkinson's disease was inconclusive, saw for second opinions and repeat DAT negative. MRi of the brain was completed in the past. We have tried nerve blocks. He has seen audiology and ENT. We have consideredmigraine with vertigo aura and tried to treat him.   We have tested him for causes of neuropathy. He saw a neurologist for possible neuropathy Dr. stevens and was tested again for causes Dr. stevens [facility] and he was tested, had an emg/ncs, tested for serum causes, borderline  normal. I do NOT think his mild/borderline neuropathy is a cause here.    - We ordered MRI cervical spine to ensure imbalance/ataxia not myelopathy or other  but it was not completed. He has neck pain, numbness and tingling in the right arm , clumsiness in fine motor skills like buttoning a shirt right arm, difficulty walking or maintaining balance, check mri cervical spine for myelopathy  MRA head 06/2019 was negative.   - He has distal neuropathy in his feet. We have tested him in the past for several etiologies such as B12 and a thorough evaluation. Idiopathic.  - MRI shows large ventricles: s/p shunting. He feels strange with gait, he had a thorough workup for neuropathy which was negative including emg/ncs.  Will test a few more labs.  - rizatriptan  acutely, also he felt the IV infusion for migraines helped when he had his dizziness we can always do that again if needed. Today tried nurtec and if episodes becomemore frequent we can discuss preventative  Orders Placed This Encounter  Procedures   MR CERVICAL SPINE WO CONTRAST      Cc: Andrew Carlin Redbird, MD  Onetha Epp, MD  [facility] [address] [place], [place] [address]  I spent 44 minutes of face-to-face and non-face-to-face time with patient on the  1. Ataxia   2. Imbalance   3. Chronic neck pain   4. Right arm weakness   5. Clumsiness   6. Fine motor impairment       diagnosis.  This included previsit chart review, lab review, study review, order entry, electronic health record documentation, patient education on the different diagnostic and therapeutic options, counseling and coordination of care, risks and benefits of management, compliance, or risk factor reduction. This does not include time spent on injections.

## 2024-05-26 NOTE — Addendum Note (Signed)
 Addended by: Jolane Bankhead B on: 05/26/2024 06:29 PM   Modules accepted: Level of Service

## 2024-06-01 DIAGNOSIS — E7800 Pure hypercholesterolemia, unspecified: Secondary | ICD-10-CM | POA: Diagnosis not present

## 2024-06-01 DIAGNOSIS — Z79899 Other long term (current) drug therapy: Secondary | ICD-10-CM | POA: Diagnosis not present

## 2024-06-01 DIAGNOSIS — R7303 Prediabetes: Secondary | ICD-10-CM | POA: Diagnosis not present

## 2024-06-05 ENCOUNTER — Ambulatory Visit
Admission: RE | Admit: 2024-06-05 | Discharge: 2024-06-05 | Disposition: A | Discharge status: Home / Self Care | Source: Ambulatory Visit | Attending: Neurology

## 2024-06-05 DIAGNOSIS — R2689 Other abnormalities of gait and mobility: Secondary | ICD-10-CM

## 2024-06-05 DIAGNOSIS — G8929 Other chronic pain: Secondary | ICD-10-CM

## 2024-06-05 DIAGNOSIS — R278 Other lack of coordination: Secondary | ICD-10-CM

## 2024-06-05 DIAGNOSIS — R29898 Other symptoms and signs involving the musculoskeletal system: Secondary | ICD-10-CM | POA: Diagnosis not present

## 2024-06-05 DIAGNOSIS — R270 Ataxia, unspecified: Secondary | ICD-10-CM | POA: Diagnosis not present

## 2024-06-05 DIAGNOSIS — R29818 Other symptoms and signs involving the nervous system: Secondary | ICD-10-CM | POA: Diagnosis not present

## 2024-06-05 DIAGNOSIS — M542 Cervicalgia: Secondary | ICD-10-CM | POA: Diagnosis not present

## 2024-06-13 ENCOUNTER — Ambulatory Visit: Payer: Self-pay | Admitting: Neurology

## 2024-06-20 DIAGNOSIS — G479 Sleep disorder, unspecified: Secondary | ICD-10-CM | POA: Diagnosis not present

## 2024-06-20 DIAGNOSIS — R7303 Prediabetes: Secondary | ICD-10-CM | POA: Diagnosis not present

## 2024-06-20 DIAGNOSIS — E7800 Pure hypercholesterolemia, unspecified: Secondary | ICD-10-CM | POA: Diagnosis not present

## 2024-06-20 DIAGNOSIS — Z6827 Body mass index (BMI) 27.0-27.9, adult: Secondary | ICD-10-CM | POA: Diagnosis not present

## 2024-06-20 DIAGNOSIS — G629 Polyneuropathy, unspecified: Secondary | ICD-10-CM | POA: Diagnosis not present

## 2024-06-20 DIAGNOSIS — Z0000 Encounter for general adult medical examination without abnormal findings: Secondary | ICD-10-CM | POA: Diagnosis not present

## 2024-07-19 ENCOUNTER — Ambulatory Visit: Admitting: Neurology

## 2024-07-24 ENCOUNTER — Telehealth: Payer: Self-pay | Admitting: Neurology

## 2024-07-24 DIAGNOSIS — R29818 Other symptoms and signs involving the nervous system: Secondary | ICD-10-CM

## 2024-07-24 DIAGNOSIS — R2689 Other abnormalities of gait and mobility: Secondary | ICD-10-CM

## 2024-07-24 DIAGNOSIS — G20A1 Parkinson's disease without dyskinesia, without mention of fluctuations: Secondary | ICD-10-CM

## 2024-07-24 DIAGNOSIS — R270 Ataxia, unspecified: Secondary | ICD-10-CM

## 2024-07-24 NOTE — Telephone Encounter (Signed)
 Pt called wanting to speak to the nurse regarding the next step he is to take now that his provider is no longer in office. Pt would prefer to talk to the nurse.

## 2024-07-25 NOTE — Telephone Encounter (Signed)
 I called pt.  He asked about process since Dr. Ines left.  I told him that we have a reassignment team that makes appts for pts.  If they would like to go to another practice we also will do referral once reviewed by our WID , pt would like to go to [facility], Dr. Evonnie.  I told him will send to [facility] and let him review then will let him know.  He appreciated this.  [facility] in past.

## 2024-07-26 ENCOUNTER — Telehealth: Payer: Self-pay | Admitting: Neurology

## 2024-07-26 ENCOUNTER — Encounter: Payer: Self-pay | Admitting: *Deleted

## 2024-07-26 NOTE — Telephone Encounter (Signed)
 I called and let pt know that referral was placed TOC, to Dr. Evonnie.  He appreciated this.

## 2024-07-26 NOTE — Telephone Encounter (Signed)
 Consulted [facility] , Dr Rosemarie about referral to Dr. Evonnie.  Per Dr. Rosemarie:    07/25/24  5:20 PM Agree with Dr Tat if she is agreeing Me to Rosemarie Eather RAMAN, MD.    Referral placed.

## 2024-07-26 NOTE — Telephone Encounter (Signed)
 Referral to Neurology sent thru Epic to [facility]  [facility] Phone [phone]

## 2024-08-03 DIAGNOSIS — G609 Hereditary and idiopathic neuropathy, unspecified: Secondary | ICD-10-CM | POA: Diagnosis not present

## 2024-08-03 DIAGNOSIS — G912 (Idiopathic) normal pressure hydrocephalus: Secondary | ICD-10-CM | POA: Diagnosis not present

## 2024-08-03 DIAGNOSIS — Z982 Presence of cerebrospinal fluid drainage device: Secondary | ICD-10-CM | POA: Diagnosis not present

## 2024-08-03 DIAGNOSIS — G43909 Migraine, unspecified, not intractable, without status migrainosus: Secondary | ICD-10-CM | POA: Diagnosis not present

## 2024-08-07 DIAGNOSIS — G4733 Obstructive sleep apnea (adult) (pediatric): Secondary | ICD-10-CM | POA: Diagnosis not present

## 2024-08-22 DIAGNOSIS — D225 Melanocytic nevi of trunk: Secondary | ICD-10-CM | POA: Diagnosis not present

## 2024-08-22 DIAGNOSIS — L821 Other seborrheic keratosis: Secondary | ICD-10-CM | POA: Diagnosis not present

## 2024-08-22 DIAGNOSIS — Z85828 Personal history of other malignant neoplasm of skin: Secondary | ICD-10-CM | POA: Diagnosis not present

## 2024-08-22 DIAGNOSIS — M67449 Ganglion, unspecified hand: Secondary | ICD-10-CM | POA: Diagnosis not present

## 2024-08-22 DIAGNOSIS — Z86018 Personal history of other benign neoplasm: Secondary | ICD-10-CM | POA: Diagnosis not present

## 2024-08-22 DIAGNOSIS — L570 Actinic keratosis: Secondary | ICD-10-CM | POA: Diagnosis not present

## 2024-08-22 DIAGNOSIS — L578 Other skin changes due to chronic exposure to nonionizing radiation: Secondary | ICD-10-CM | POA: Diagnosis not present

## 2024-08-28 ENCOUNTER — Ambulatory Visit: Admitting: Neurology
